# Patient Record
Sex: Female | Born: 1945 | Race: White | Hispanic: No | Marital: Married | Smoking: Current every day smoker
Health system: Southern US, Community
[De-identification: ages and names within clinical notes are randomized; demographics above are authoritative.]

## PROBLEM LIST (undated history)

## (undated) DIAGNOSIS — F410 Panic disorder [episodic paroxysmal anxiety] without agoraphobia: Secondary | ICD-10-CM

## (undated) DIAGNOSIS — F988 Other specified behavioral and emotional disorders with onset usually occurring in childhood and adolescence: Secondary | ICD-10-CM

## (undated) DIAGNOSIS — J189 Pneumonia, unspecified organism: Secondary | ICD-10-CM

## (undated) DIAGNOSIS — F419 Anxiety disorder, unspecified: Secondary | ICD-10-CM

## (undated) DIAGNOSIS — K649 Unspecified hemorrhoids: Secondary | ICD-10-CM

## (undated) DIAGNOSIS — G47419 Narcolepsy without cataplexy: Secondary | ICD-10-CM

## (undated) DIAGNOSIS — J9601 Acute respiratory failure with hypoxia: Secondary | ICD-10-CM

## (undated) DIAGNOSIS — E876 Hypokalemia: Secondary | ICD-10-CM

## (undated) HISTORY — DX: Other specified behavioral and emotional disorders with onset usually occurring in childhood and adolescence: F98.8

## (undated) HISTORY — DX: Unspecified hemorrhoids: K64.9

## (undated) HISTORY — DX: Narcolepsy without cataplexy: G47.419

## (undated) HISTORY — DX: Anxiety disorder, unspecified: F41.9

## (undated) HISTORY — DX: Pneumonia, unspecified organism: J18.9

## (undated) HISTORY — DX: Panic disorder (episodic paroxysmal anxiety): F41.0

## (undated) HISTORY — DX: Hypokalemia: E87.6

## (undated) HISTORY — PX: TUBAL LIGATION: SHX77

---
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
## 2007-12-21 ENCOUNTER — Inpatient Hospital Stay: Payer: Self-pay | Admitting: Internal Medicine

## 2010-06-14 ENCOUNTER — Inpatient Hospital Stay: Payer: Self-pay | Admitting: Psychiatry

## 2011-03-01 ENCOUNTER — Emergency Department: Payer: Self-pay | Admitting: Emergency Medicine

## 2011-10-14 DIAGNOSIS — G47419 Narcolepsy without cataplexy: Secondary | ICD-10-CM | POA: Insufficient documentation

## 2011-12-03 ENCOUNTER — Inpatient Hospital Stay: Payer: Self-pay | Admitting: Internal Medicine

## 2011-12-03 LAB — ACETAMINOPHEN LEVEL
Acetaminophen: <2 ug/mL
Acetaminophen: <2 ug/mL

## 2011-12-03 LAB — DRUG SCREEN, URINE
Amphetamines, Ur Screen: NEGATIVE (ref ?–1000)
Barbiturates, Ur Screen: NEGATIVE (ref ?–200)
Benzodiazepine, Ur Scrn: POSITIVE (ref ?–200)
Cocaine Metabolite,Ur ~~LOC~~: NEGATIVE (ref ?–300)
MDMA (Ecstasy)Ur Screen: NEGATIVE (ref ?–500)
Methadone, Ur Screen: NEGATIVE (ref ?–300)
Opiate, Ur Screen: NEGATIVE (ref ?–300)
Tricyclic, Ur Screen: NEGATIVE (ref ?–1000)

## 2011-12-03 LAB — URINALYSIS, COMPLETE
Bacteria: NONE SEEN
Blood: NEGATIVE
Hyaline Cast: 4
Leukocyte Esterase: NEGATIVE
Nitrite: NEGATIVE
Ph: 5 (ref 4.5–8.0)
Protein: 30
RBC,UR: 1 /HPF (ref 0–5)
Specific Gravity: 1.026 (ref 1.003–1.030)
Squamous Epithelial: <1
WBC UR: 1 /HPF (ref 0–5)

## 2011-12-03 LAB — COMPREHENSIVE METABOLIC PANEL
Albumin: 3 g/dL — ABNORMAL LOW (ref 3.4–5.0)
Alkaline Phosphatase: 49 U/L — ABNORMAL LOW (ref 50–136)
Alkaline Phosphatase: 59 U/L (ref 50–136)
Alkaline Phosphatase: 55 U/L (ref 50–136)
Anion Gap: 5 — ABNORMAL LOW (ref 7–16)
BUN: 18 mg/dL (ref 7–18)
Bilirubin,Total: 0.3 mg/dL (ref 0.2–1.0)
Bilirubin,Total: 0.2 mg/dL (ref 0.2–1.0)
Bilirubin,Total: 0.2 mg/dL (ref 0.2–1.0)
Calcium, Total: 8.8 mg/dL (ref 8.5–10.1)
Calcium, Total: 7.6 mg/dL — ABNORMAL LOW (ref 8.5–10.1)
Calcium, Total: 7.9 mg/dL — ABNORMAL LOW (ref 8.5–10.1)
Chloride: 116 mmol/L — ABNORMAL HIGH (ref 98–107)
Chloride: 119 mmol/L — ABNORMAL HIGH (ref 98–107)
Chloride: 116 mmol/L — ABNORMAL HIGH (ref 98–107)
Chloride: 117 mmol/L — ABNORMAL HIGH (ref 98–107)
Co2: 23 mmol/L (ref 21–32)
Co2: 22 mmol/L (ref 21–32)
Co2: 21 mmol/L (ref 21–32)
Creatinine: 0.83 mg/dL (ref 0.60–1.30)
Creatinine: 0.69 mg/dL (ref 0.60–1.30)
Creatinine: 0.62 mg/dL (ref 0.60–1.30)
EGFR (African American): >60
EGFR (African American): >60
EGFR (Non-African Amer.): >60
EGFR (Non-African Amer.): >60
Osmolality: 286 (ref 275–301)
Potassium: 3.9 mmol/L (ref 3.5–5.1)
Potassium: 4.1 mmol/L (ref 3.5–5.1)
Potassium: 3.9 mmol/L (ref 3.5–5.1)
SGOT(AST): 44 U/L — ABNORMAL HIGH (ref 15–37)
SGPT (ALT): 24 U/L (ref 12–78)
SGPT (ALT): 20 U/L (ref 12–78)
SGPT (ALT): 18 U/L (ref 12–78)
SGPT (ALT): 23 U/L (ref 12–78)
Sodium: 145 mmol/L (ref 136–145)
Sodium: 147 mmol/L — ABNORMAL HIGH (ref 136–145)
Total Protein: 7.2 g/dL (ref 6.4–8.2)
Total Protein: 5.6 g/dL — ABNORMAL LOW (ref 6.4–8.2)

## 2011-12-03 LAB — BASIC METABOLIC PANEL
Anion Gap: 8 (ref 7–16)
Anion Gap: 8 (ref 7–16)
BUN: 11 mg/dL (ref 7–18)
Calcium, Total: 7.5 mg/dL — ABNORMAL LOW (ref 8.5–10.1)
Chloride: 120 mmol/L — ABNORMAL HIGH (ref 98–107)
Chloride: 115 mmol/L — ABNORMAL HIGH (ref 98–107)
Co2: 20 mmol/L — ABNORMAL LOW (ref 21–32)
Creatinine: 0.71 mg/dL (ref 0.60–1.30)
Creatinine: 0.62 mg/dL (ref 0.60–1.30)
EGFR (African American): >60
EGFR (African American): >60
Glucose: 89 mg/dL (ref 65–99)
Osmolality: 296 (ref 275–301)
Osmolality: 286 (ref 275–301)
Sodium: 144 mmol/L (ref 136–145)

## 2011-12-03 LAB — CBC
HCT: 38.4 % (ref 35.0–47.0)
MCH: 30.6 pg (ref 26.0–34.0)
MCHC: 32.7 g/dL (ref 32.0–36.0)
MCV: 94 fL (ref 80–100)
Platelet: 277 10*3/uL (ref 150–440)
RDW: 14 % (ref 11.5–14.5)
WBC: 8 10*3/uL (ref 3.6–11.0)

## 2011-12-03 LAB — SALICYLATE LEVEL
Salicylates, Serum: 48.6 mg/dL
Salicylates, Serum: 37.4 mg/dL

## 2011-12-03 LAB — ETHANOL
Ethanol %: <0.003 % (ref 0.000–0.080)
Ethanol: <3 mg/dL

## 2011-12-03 LAB — TSH: Thyroid Stimulating Horm: 1.2 u[IU]/mL

## 2011-12-04 LAB — COMPREHENSIVE METABOLIC PANEL
Alkaline Phosphatase: 54 U/L (ref 50–136)
Bilirubin,Total: 0.2 mg/dL (ref 0.2–1.0)
Calcium, Total: 8.2 mg/dL — ABNORMAL LOW (ref 8.5–10.1)
Chloride: 112 mmol/L — ABNORMAL HIGH (ref 98–107)
Co2: 27 mmol/L (ref 21–32)
Creatinine: 0.55 mg/dL — ABNORMAL LOW (ref 0.60–1.30)
EGFR (African American): >60
EGFR (Non-African Amer.): >60
SGOT(AST): 36 U/L (ref 15–37)
SGPT (ALT): 20 U/L (ref 12–78)
Total Protein: 5.8 g/dL — ABNORMAL LOW (ref 6.4–8.2)

## 2011-12-04 LAB — SALICYLATE LEVEL: Salicylates, Serum: 16.7 mg/dL — ABNORMAL HIGH

## 2014-02-03 ENCOUNTER — Emergency Department: Payer: Self-pay | Admitting: Internal Medicine

## 2014-02-03 LAB — COMPREHENSIVE METABOLIC PANEL WITH GFR
Albumin: 3.4 g/dL (ref 3.4–5.0)
Alkaline Phosphatase: 61 U/L
Anion Gap: 7 (ref 7–16)
BUN: 13 mg/dL (ref 7–18)
Bilirubin,Total: 0.2 mg/dL (ref 0.2–1.0)
Calcium, Total: 8.5 mg/dL (ref 8.5–10.1)
Chloride: 108 mmol/L — ABNORMAL HIGH (ref 98–107)
Co2: 30 mmol/L (ref 21–32)
Creatinine: 0.8 mg/dL (ref 0.60–1.30)
EGFR (African American): >60
EGFR (Non-African Amer.): >60
Glucose: 89 mg/dL (ref 65–99)
Osmolality: 288 (ref 275–301)
Potassium: 2.6 mmol/L — ABNORMAL LOW (ref 3.5–5.1)
SGOT(AST): 33 U/L (ref 15–37)
SGPT (ALT): 21 U/L
Sodium: 145 mmol/L (ref 136–145)
Total Protein: 6.7 g/dL (ref 6.4–8.2)

## 2014-02-03 LAB — URINALYSIS, COMPLETE
BLOOD: NEGATIVE
Bacteria: NONE SEEN
Bilirubin,UR: NEGATIVE
Glucose,UR: NEGATIVE mg/dL (ref 0–75)
LEUKOCYTE ESTERASE: NEGATIVE
Nitrite: NEGATIVE
Ph: 7 (ref 4.5–8.0)
Protein: 30
Specific Gravity: 1.017 (ref 1.003–1.030)
WBC UR: 2 /HPF (ref 0–5)

## 2014-02-03 LAB — CBC
HCT: 40.4 % (ref 35.0–47.0)
HGB: 13.2 g/dL (ref 12.0–16.0)
MCH: 32 pg (ref 26.0–34.0)
MCHC: 32.8 g/dL (ref 32.0–36.0)
MCV: 98 fL (ref 80–100)
Platelet: 330 10*3/uL (ref 150–440)
RBC: 4.14 10*6/uL (ref 3.80–5.20)
RDW: 13.7 % (ref 11.5–14.5)
WBC: 5.1 10*3/uL (ref 3.6–11.0)

## 2014-02-03 LAB — CK TOTAL AND CKMB (NOT AT ARMC)
CK, TOTAL: 318 U/L — AB (ref 26–192)
CK-MB: 1.2 ng/mL (ref 0.5–3.6)

## 2014-02-03 LAB — TROPONIN I: Troponin-I: <0.02 ng/mL

## 2014-02-03 LAB — INFLUENZA A,B,H1N1 - PCR (ARMC)
H1N1FLUPCR: NOT DETECTED
INFLBPCR: NEGATIVE
Influenza A By PCR: NEGATIVE

## 2014-04-26 DIAGNOSIS — Z23 Encounter for immunization: Secondary | ICD-10-CM | POA: Diagnosis not present

## 2014-04-26 DIAGNOSIS — K649 Unspecified hemorrhoids: Secondary | ICD-10-CM | POA: Diagnosis not present

## 2014-04-26 DIAGNOSIS — E876 Hypokalemia: Secondary | ICD-10-CM | POA: Diagnosis not present

## 2014-05-17 ENCOUNTER — Ambulatory Visit: Payer: Self-pay | Admitting: Family Medicine

## 2014-05-17 DIAGNOSIS — R0602 Shortness of breath: Secondary | ICD-10-CM | POA: Diagnosis not present

## 2014-05-17 DIAGNOSIS — M545 Low back pain: Secondary | ICD-10-CM | POA: Diagnosis not present

## 2014-05-17 DIAGNOSIS — M5137 Other intervertebral disc degeneration, lumbosacral region: Secondary | ICD-10-CM | POA: Diagnosis not present

## 2014-05-17 DIAGNOSIS — F419 Anxiety disorder, unspecified: Secondary | ICD-10-CM | POA: Diagnosis not present

## 2014-05-17 DIAGNOSIS — M5136 Other intervertebral disc degeneration, lumbar region: Secondary | ICD-10-CM | POA: Diagnosis not present

## 2014-06-07 DIAGNOSIS — K219 Gastro-esophageal reflux disease without esophagitis: Secondary | ICD-10-CM | POA: Diagnosis not present

## 2014-06-07 DIAGNOSIS — F419 Anxiety disorder, unspecified: Secondary | ICD-10-CM | POA: Diagnosis not present

## 2014-06-07 DIAGNOSIS — M545 Low back pain: Secondary | ICD-10-CM | POA: Diagnosis not present

## 2014-06-07 DIAGNOSIS — F988 Other specified behavioral and emotional disorders with onset usually occurring in childhood and adolescence: Secondary | ICD-10-CM | POA: Insufficient documentation

## 2014-06-14 NOTE — H&P (Signed)
PATIENT NAME:  Sandra Stout, Sandra Stout MR#:  [id number] DATE OF BIRTH:  [birth year]  DATE OF ADMISSION:  12/03/2011  PRIMARY CARE PHYSICIAN: Unknown.  CHIEF COMPLAINT: Altered mental status.   HISTORY OF PRESENT ILLNESS: Please note history is obtained from the ED staff who was able to obtain history from EMS who spoke to the patient's family. The patient currently is unable to provide any history. Medical records were also reviewed to supplement this history. This is a 69 year old female with history of depression with prior inpatient psychiatric hospitalization, narcolepsy, asthma/chronic obstructive pulmonary disease, and question of attention deficit disorder who presents with altered mental status. Reportedly the patient ran out of Xanax four days ago and this evening the family contacted EMS because she had been increasingly agitated and confused. The family denied any trauma and prior to these events she had been alert and oriented and walked without assistance. The only history the patient was able to provide to the ED staff was that she had been taking BC Powder for diarrhea. Initial evaluation in the emergency department revealed the patient's salicylate level is significantly elevated to 48.6. Attempt to contact her daughter, Evert KohlWendy Meadows, was unsuccessful to obtain further history. While in the Emergency Department, the patient had jerking limb movements with bizarre behavior of picking at her skin us if she was having hallucinations so she received Haldol.   PAST MEDICAL HISTORY:  1. Depression status post multiple inpatient psychiatric hospitalizations for severe depression.  2. 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 Review of records notes Dr. Daneen Schickodney Radtke as her neurologist.  3. Asthma/chronic obstructive pulmonary disease.  4. Question of attention deficit disorder.   PAST SURGICAL HISTORY: Unknown.  MEDICATIONS: Currently unknown. The patient was taking Xanax in the outpatient  setting. For the medications, other medications are unknown currently.  ALLERGIES: No known drug allergies.   FAMILY HISTORY: Per records, notable for diabetes, cerebrovascular accident, and heart disease.   SOCIAL HISTORY: Unknown currently. Review of records notes that she is on disability for psychiatric issues and she uses tobacco.   REVIEW OF SYSTEMS: Unable to obtain due to the patient's mental status.   PHYSICAL EXAM:   VITAL SIGNS: Temperature 98.5, heart rate 114, blood pressure 150/66, and saturation 95% on room air.   PHYSICAL EXAM:   GENERAL: No apparent distress currently. The patient is somnolent and unarousable, is confused and mumbling.   EYES: Pupils equal, round, and reactive to light and accommodation. Anicteric sclerae. Normal eyelids.   ENT: Normal external ears and nares. Oropharynx that was visualized was without any lesions.   NECK: Supple. No JVD. No thyromegaly   CARDIOVASCULAR: S1 and S2 regular rate and rhythm. Heart rate currently 88. No pretibial edema. Pulses full bilaterally in upper extremities and distally.   PULMONARY: Clear to auscultation bilaterally, normal effort.   ABDOMEN: Soft, nontender, and nondistended without hepatosplenomegaly.  MUSCULOSKELETAL: Normal tone. No clubbing or cyanosis noted.   PSYCH: The patient is confused, mumbling, not oriented. Judgment is significantly impaired.   SKIN: Warm and dry. No rash is noted. Normal color.  NEUROLOGIC: GCS score is 9 as she opens her eyes to loud voice, makes incomprehensible sounds and withdraws from pain. She moves all extremities spontaneously. Unable to assess cranial nerves and sensation.   LABS/RADIOLOGIC STUDIES: Glucose 95, BUN 18, creatinine 0.83, sodium 145, potassium 3.9, chloride 116, bicarbonate 22, calcium 8.8, bilirubin 0.3, alkaline phosphatase 62, ALT 24, AST 41, total protein 7.2, and albumin 3.9. Osmolality 290. GFR  greater than 60. Anion gap 7. Ethanol is less than 3.  WBC count 8, hemoglobin 12.6, hematocrit 38.4, platelet count 277, and MCV 94. Salicylate level was 48.6. Troponin is less than 0.02. TSH is 1.2. Acetaminophen level is less than 2.  ABG shows pH of 7.41, pCO2 31, PO2 67, FiO2 21%, bicarbonate 19.6, and saturating 95.6 on room air.   Lactic acid 0.6.   Chest x-ray shows hyperinflated lungs consistent with chronic obstructive pulmonary disease. No acute infiltrates. This is a preliminary read.   CT of the head shows no evidence of intracranial hemorrhage or mass, no abnormal fluid collections.   ASSESSMENT AND PLAN: A 69 year old female presenting with altered mental status found to have salicylate toxicity and may also be undergoing benzodiazepine withdrawal.  1. Altered mental status. Etiology of this is multifactorial including salicylate toxicity, possible benzodiazepine withdrawal and any other ingestions. However, at this time we have not identified any other ingestions and will manage the patient symptomatically.  2. Salicylate toxicity. Poison Control has been contacted and recommendations have been provided. Alkalization of serum and urine has begun with D5 water with 40 mEq of potassium chloride and 3 amps of IV bicarbonate. We will continue this fluid. Activated charcoal was not given in the ED due to patient's mental status and concern that she would not be able to protect her airway. We will monitor her electrolytes as well as salicylate level every three hours until her salicylate levels decrease two times consecutively. If her salicylate level continues to rise with one being greater than 80, seizures, pulmonary edema, or acute renal failure we will discuss with nephrology regarding emergent hemodialysis. ABG indicates respiratory alkalosis indicating that this is most likely an acute intoxication. We will continue to monitor for anion gap metabolic acidosis, hypokalemia. The patient's status is very tenuous as she could progress to  respiratory failure. However, we will avoid oral tracheal intubation as much as possible as this can be very dangerous for this patient. The patient needs to be in the Intensive Care Unit and if the decision is made for intubation we will defer to critical care recommendations as she will need significantly high tidal volumes particularly if her respiratory alkalosis and acidoses worsen. We will repeat lactic acid level as I expect this to worsen. We will supplement her glucose with her IV fluids. We will avoid sedatives. 3. Depression. The patient is on involuntary commitment at this time given her mental status and the likely degeneration of her current status given her acute salicylate toxicity. There is no family at the bedside and attempts to contact them were unsuccessful. She needs to be treated as this intoxication could prove fatal. We will place a psych consult once the patient is medically clear from medical management.  4. Narcolepsy. Review of records notes that the patient was being treated with Xanax and Ritalin about a year ago for her narcolepsy. Unclear what she is on now and if she is still being treated for narcolepsy. Further management of this will be deferred until the patient is more stable.  5. Asthma/chronic obstructive pulmonary disease. The patient is currently stable. We will have nebulizers p.r.n. Again, decision for mechanical support will be assessed based on the patient's clinical status recognizing that intubation could be quite dangerous in the setting of salicylate toxicity.  6. Prophylaxis with Lovenox.   DISPOSITION: The patient is being admitted to the Critical Care Unit for further monitoring. She will be placed on telemetry to monitor  for any fatal arrhythmias as well.   CODE STATUS: The patient is presumed FULL CODE as there is no family available.   CRITICAL CARE TIME COORDINATING PATIENT'S CARE: 40 minutes.  ____________________________ Aurther Loft,  DO aeo:slb D: 12/03/2011 04:14:52 ET     T: 12/03/2011 08:47:21 ET        JOB#: [id number] cc: Aurther Loft, DO, <Dictator> Tamario Heal E Shaylee Stanislawski DO ELECTRONICALLY SIGNED 12/10/2011 22:31

## 2014-06-14 NOTE — Discharge Summary (Signed)
PATIENT NAME:  Sandra Stout, Garnell W MR#:  [id number] DATE OF BIRTH:  1946-02-09  DATE OF ADMISSION:  12/03/2011 DATE OF DISCHARGE:  12/04/2011  PRIMARY CARE PHYSICIAN: None.   The patient follows up with [facility].     PRESENTING COMPLAINT: Altered mental status.   DISCHARGE DIAGNOSES:  1. Altered mental status due to salicylate toxicity.  2. Acute encephalopathy secondary to salicylate toxicity.   CONDITION ON DISCHARGE: Fair.   MEDICATIONS:  1. Methylphenidate 10 mg 6 to 8 tablets once a day.  2. Xanax 0.5 mg 4 times a day as needed.   DIET: Regular.   FOLLOWUP: Follow up with your [facility] neurologist in 1 to 2 weeks.   Psychiatry consultation, Dr. Margarita RanaSurya Challa.  LABS ON DISCHARGE: Glucose is 92, BUN is 7, creatinine 0.5, sodium is 146, potassium 3.3, chloride is 112, bicarbonate is 27, calcium is 8.2, bilirubin is 0.82, alkaline phosphatase is 54. LFTs normal. Total protein is 5.8, albumin is 3.0. Serum salicylate 16.7. Lactic acid is 1.1. Salicylate on admission was 48. Urinalysis negative for urinary tract infection. Urine drug screen positive for benzodiazepines. CT of the head was acute atrophy with chronic microvascular ischemic disease. Chest x-ray: No cardiopulmonary disease event. Density in 2009 suggested infiltrate in the superior segment of the right lower lobe is not evident. Serum ethanol less than 0.003. Cardiac enzymes negative. TSH is 1.2. Acetaminophen is 2.   HISTORY OF PRESENT ILLNESS:  1. [name] is a 69 year old Caucasian female presented with altered mental status, found to salicylate toxicity, and may be undergoing benzodiazepine withdrawal. She was admitted with acute toxic metabolic encephalopathy/altered mental status in the setting of salicylate toxicity, possible benzodiazepine withdrawal, and any other ingestions.  However, at this time we have not identified any other ingestions, and patient was admitted for symptomatic management. She was placed in the  intensive care unit along with sitter. Denied any suicidal ideation. Was seen by Dr. Guss Bundehalla from Psychiatry and was okay to go home from psychiatry standpoint. The patient was given IV fluids in the form of bicarbonate drip to alkalinize her urine in the setting of salicylate toxicity. Her serum salicylate was down from 48 to 16.7 prior to discharge. The patient was eating well, ambulating well prior to discharge.  2. Salicylate toxicity. Poison Control was contacted. Recommendations were provided and were followed. Salicylate levels were nontoxic by her discharge.  3. History of narcolepsy. Patient is on Xanax and Ritalin, and she follows up with [facility] where she is recommended to go to follow up with.  4. Asthma, chronic.  5. Chronic obstructive pulmonary disease, currently stable.  6. Deep vein thrombosis prophylaxis with Lovenox was given.  7. History of major depression.  8. Hospital stay otherwise remained stable.   TIME SPENT: 40 minutes.      ____________________________ Wylie HailSona A. Allena KatzPatel, MD sap:vtd D: 12/05/2011 13:55:39 ET T: 12/06/2011 11:10:56 ET JOB#: [id number]  cc: Janani Chamber A. Allena KatzPatel, MD, <Dictator> Willow OraSONA A Harrington Jobe MD ELECTRONICALLY SIGNED 12/06/2011 21:00

## 2014-06-14 NOTE — Consult Note (Signed)
PATIENT NAME:  Sandra Stout, Sandra Stout MR#:  [id number] DATE OF BIRTH:  05-Jan-1946  DATE OF CONSULTATION:  12/04/2011  CONSULTING PHYSICIAN:  Geron Mulford K. Josclyn Rosales, MD  SUBJECTIVE: The patient was seen in room #135 along with her daughter, who was very supportive, and a Psychologist, educationalone-on-one sitter.  Her daughter reports that the patient is doing very well and she is back to herself.  The patient is well dressed and groomed.  The patient is dressed in hospital clothes, but she wore makeup, and her hair was combed, and she was seen relaxing in the bed.  She was cheerful and smiling and cooperative. She was interacting well with the staff and also with her daughter and the undersigned.  She is eager to go home and take care of herself.  Her daughter reported that the patient will be coming home to stay with her for a few days, and then she will go back to her apartment. Her daughter reports that the air conditioner was working, and she just pressed the wrong button.   OBJECTIVE: The patient is dressed in hospital clothes, alert and oriented, pleasant and cooperative, no agitation.  She is fully aware of the situation that brought her for admission to the hospital.  She absolutely denies feeling depressed.  She was cheerful and smiling.  She denies feeling hopeless or helpless.  She denies feeling worthless or useless.  No psychosis.  Coordination is intact.  Memory is intact.  She denies any ideas or plans to hurt herself or others and that was not her intention to hurt herself.  She realizes that she should not be taking any other medications other than those prescribed. Insight and judgment are fair and adequate.    IMPRESSION/PLAN: The patient is stable.  Please discontinue one-on-one sitter.  Discharge the patient to the care of her daughter with whom she will be living.  She will keep up her [facility] with her neurologist who has been taking care of her mental health, and the patient  lives very well with this.  ____________________________ Jannet MantisSurya K. Guss Bundehalla, MD skc:cbb D: 12/04/2011 13:35:49 ET T: 12/04/2011 13:37:41 ET JOB#: [id number]  cc: Monika SalkSurya K. Guss Bundehalla, MD, <Dictator> Beau FannySURYA K Saivion Goettel MD ELECTRONICALLY SIGNED 12/07/2011 19:03

## 2014-06-14 NOTE — Consult Note (Signed)
PATIENT NAME:  Sandra Stout, CURENTON MR#:  [id number] DATE OF BIRTH:  1945/07/05  DATE OF CONSULTATION:  12/03/2011  REFERRING PHYSICIAN:   CONSULTING PHYSICIAN:  Samin Milke K. Larri Brewton, MD  SUBJECTIVE: The patient was seen in consultation by Dr. Guss Bunde. The patient is a 69 year old white female, retired, and has been on disability for narcolepsy and panic attacks and manic depressive illness, being followed by neurologist at [facility]. The patient is separated for 13 years after being married for many years. The patient lives by herself in a one bedroom apartment. History was obtained from the daughter and it is very reliable.   CHIEF COMPLAINT: Chief complaint according to the daughter "I've been very tired and exhausted and when exhausted she takes aspirin powders which are over-the-counter powders and she took too many".   HISTORY OF PRESENT ILLNESS: According to information obtained from the daughter and the staff, the patient's air conditioning was not working for the past few days and temperature was high in the apartment and she thought she had fever and she was taking aspirin powders and took too much of the same. In addition, she was on Xanax 1 mg p.o. twice a day and she ran out of it for three days because she did not get it filled. Daughter reported that she's the only person who has a car in the family and she works full-time and is a single mother and so the patient has difficulties going and getting her medications filled. The apartment where she lives had to get sprayed for pest control and all the neighbors came out of their apartments and they wanted to help the patient out because they realized that she lives by herself and when the went to get her out they found that her behavior was bizarre and she was not her normal self and so they called an ambulance and she was brought to [facility] Emergency Room for help.   PAST PSYCHIATRIC HISTORY: Daughter reports that she had one inpatient  hospitalization on Psychiatry in 2012 for similar episode and was inpatient for a few days for observation and was discharged home. She gets her medications from her neurologist who takes care of her mental illness and also narcolepsy. She had been to a psychiatrist many years ago and not in the recent time.   ALCOHOL AND DRUGS: Denied.   MENTAL STATUS EXAMINATION: The patient is dressed in hospital clothes, alert and oriented to place, person, and time. She is aware of the situation that brought her for admission to [facility]. Affect is appropriate with her mood which is neutral. She denies feeling depressed, denies feeling hopeless or helpless, denies feeling worthless or useless. Does admit to feeling anxious and tired and overwhelmed about taking care of herself. No evident psychosis. Denies any auditory or visual hallucinations. Denies any paranoid or suspicious ideas. Memory and recall are good. She wants to get better and wants to be helped and absolutely denies any ideas or plans to hurt herself or others. She took too many powders because she felt tired and she does this whenever she feels tired. Insight and judgment guarded.   IMPRESSION:  AXIS I:  1. History of panic anxiety disorder disorder. 2. History of manic depressive illness and being followed by neurologist at [facility].  3. Overdose of salicylates but not with intention to hurt herself.   AXIS II: Deferred.   AXIS III: Narcolepsy, being followed by [facility].   AXIS IV:  Severe. Lives alone, gets overwhelmed taking care of self, has problems with transportation that she cannot get her medications filled on time.   AXIS V: GAF 45.   RECOMMENDATIONS: The patient needs re-evaluation on Thursday, 12/04/2011, at which time follow-up consult can be done and further recommendations can be made. Currently I recommend that the patient be observed on the medical floor and then if she's stable enough  probably can be discharged with follow-up with [facility] neurolog[facility] who has been taking care of her narcolepsy and manic depression and panic attacks. Social Services is to look into what kind of help she needs with transportation as she has problems with the same at this time and lives by herself.   ____________________________ Jannet MantisSurya K. Guss Bundehalla, MD skc:drc D: 12/03/2011 15:15:30 ET T: 12/03/2011 15:43:09 ET JOB#: [id number]  cc: Monika SalkSurya K. Guss Bundehalla, MD, <Dictator> Beau FannySURYA K Rebacca Votaw MD ELECTRONICALLY SIGNED 12/04/2011 13:34

## 2014-06-20 DIAGNOSIS — F419 Anxiety disorder, unspecified: Secondary | ICD-10-CM | POA: Diagnosis not present

## 2014-06-20 DIAGNOSIS — G47411 Narcolepsy with cataplexy: Secondary | ICD-10-CM | POA: Diagnosis not present

## 2014-08-17 ENCOUNTER — Encounter: Payer: Self-pay | Admitting: Family Medicine

## 2014-08-17 ENCOUNTER — Ambulatory Visit (INDEPENDENT_AMBULATORY_CARE_PROVIDER_SITE_OTHER): Payer: Medicare Other | Admitting: Family Medicine

## 2014-08-17 VITALS — BP 128/80 | HR 105 | Temp 97.5°F | Ht 61.0 in | Wt 116.4 lb

## 2014-08-17 DIAGNOSIS — G8929 Other chronic pain: Secondary | ICD-10-CM | POA: Diagnosis not present

## 2014-08-17 DIAGNOSIS — R002 Palpitations: Secondary | ICD-10-CM | POA: Diagnosis not present

## 2014-08-17 DIAGNOSIS — R062 Wheezing: Secondary | ICD-10-CM | POA: Diagnosis not present

## 2014-08-17 DIAGNOSIS — K219 Gastro-esophageal reflux disease without esophagitis: Secondary | ICD-10-CM | POA: Diagnosis not present

## 2014-08-17 DIAGNOSIS — M549 Dorsalgia, unspecified: Secondary | ICD-10-CM | POA: Diagnosis not present

## 2014-08-17 DIAGNOSIS — R5382 Chronic fatigue, unspecified: Secondary | ICD-10-CM | POA: Diagnosis not present

## 2014-08-17 DIAGNOSIS — F419 Anxiety disorder, unspecified: Secondary | ICD-10-CM

## 2014-08-17 MED ORDER — RANITIDINE HCL 300 MG PO TABS: 150.0000 mg | ORAL_TABLET | Freq: Every day | ORAL | Status: DC

## 2014-08-17 MED ORDER — VENLAFAXINE HCL ER 75 MG PO CP24: 225.0000 mg | ORAL_CAPSULE | Freq: Every day | ORAL | Status: DC

## 2014-08-17 NOTE — Assessment & Plan Note (Signed)
Discussed with patient that I am not a psychiatrist and if she is still not feeling better on the 225mg  of the venlafaxine to the point that she needs to take xanax 4x a day, she really needs to see psychiatry. She does not want a referral to see them at this time. Notes from neurology reviewed in care everywhere. Neurology was aware and fine with the increase in her effexor. Discussed the dangers of the medications that she is on, the increased risk of anxiety with the ritalin and the increased risk of falls and memory loss with the xanax. She is not interested in coming off of them or seeing psychiatry.

## 2014-08-17 NOTE — Patient Instructions (Addendum)
Generalized Anxiety Disorder Generalized anxiety disorder (GAD) is a mental disorder. It interferes with life functions, including relationships, work, and school. GAD is different from normal anxiety, which everyone experiences at some point in their lives in response to specific life events and activities. Normal anxiety actually helps [place] prepare for and get through these life events and activities. Normal anxiety goes away after the event or activity is over.  GAD causes anxiety that is not necessarily related to specific events or activities. It also causes excess anxiety in proportion to specific events or activities. The anxiety associated with GAD is also difficult to control. GAD can vary from mild to severe. People with severe GAD can have intense waves of anxiety with physical symptoms (panic attacks).  SYMPTOMS The anxiety and worry associated with GAD are difficult to control. This anxiety and worry are related to many life events and activities and also occur more days than not for 6 months or longer. People with GAD also have three or more of the following symptoms (one or more in children):  Restlessness.   Fatigue.  Difficulty concentrating.   Irritability.  Muscle tension.  Difficulty sleeping or unsatisfying sleep. DIAGNOSIS GAD is diagnosed through an assessment by your health care provider. Your health care provider will ask you questions aboutyour mood,physical symptoms, and events in your life. Your health care provider may ask you about your medical history and use of alcohol or drugs, including prescription medicines. Your health care provider may also do a physical exam and blood tests. Certain medical conditions and the use of certain substances can cause symptoms similar to those associated with GAD. Your health care provider may refer you to a mental health specialist for further evaluation. TREATMENT The following therapies are usually used to treat GAD:    Medication. Antidepressant medication usually is prescribed for long-term daily control. Antianxiety medicines may be added in severe cases, especially when panic attacks occur.   Talk therapy (psychotherapy). Certain types of talk therapy can be helpful in treating GAD by providing support, education, and guidance. A form of talk therapy called cognitive behavioral therapy can teach you healthy ways to think about and react to daily life events and activities.  Stress managementtechniques. These include yoga, meditation, and exercise and can be very helpful when they are practiced regularly. A mental health specialist can help determine which treatment is best for you. Some people see improvement with one therapy. However, other people require a combination of therapies. Document Released: 06/08/2012 Document Revised: 06/28/2013 Document Reviewed: 06/08/2012 [facility] Patient Information 2015 [place], [place]. This information is not intended to replace advice given to you by your health care provider. Make sure you discuss any questions you have with your health care provider. Food Choices for Gastroesophageal Reflux Disease When you have gastroesophageal reflux disease (GERD), the foods you eat and your eating habits are very important. Choosing the right foods can help ease the discomfort of GERD. WHAT GENERAL GUIDELINES DO I NEED TO FOLLOW?  Choose fruits, vegetables, whole grains, low-fat dairy products, and low-fat meat, fish, and poultry.  Limit fats such as oils, salad dressings, butter, nuts, and avocado.  Keep a food diary to identify foods that cause symptoms.  Avoid foods that cause reflux. These may be different for different people.  Eat frequent small meals instead of three large meals each day.  Eat your meals slowly, in a relaxed setting.  Limit fried foods.  Cook foods using methods other than frying.  Avoid drinking  alcohol.  Avoid drinking large amounts of  liquids with your meals.  Avoid bending over or lying down until 2-3 hours after eating. WHAT FOODS ARE NOT RECOMMENDED? The following are some foods and drinks that may worsen your symptoms: Vegetables Tomatoes. Tomato juice. Tomato and spaghetti sauce. Chili peppers. Onion and garlic. Horseradish. Fruits Oranges, grapefruit, and lemon (fruit and juice). Meats High-fat meats, fish, and poultry. This includes hot dogs, ribs, ham, sausage, salami, and bacon. Dairy Whole milk and chocolate milk. Sour cream. Cream. Butter. Ice cream. Cream cheese.  Beverages Coffee and tea, with or without caffeine. Carbonated beverages or energy drinks. Condiments Hot sauce. Barbecue sauce.  Sweets/Desserts Chocolate and cocoa. Donuts. Peppermint and spearmint. Fats and Oils High-fat foods, including [place] fries and potato chips. Other Vinegar. Strong spices, such as black pepper, white pepper, red pepper, cayenne, curry powder, cloves, ginger, and chili powder. The items listed above may not be a complete list of foods and beverages to avoid. Contact your dietitian for more information. Document Released: 02/11/2005 Document Revised: 02/16/2013 Document Reviewed: 12/16/2012 [facility] Patient Information 2015 [place]. This information is not intended to replace advice given to you by your health care provider. Make sure you discuss any questions you have with your health care provider.

## 2014-08-17 NOTE — Assessment & Plan Note (Signed)
Under fair control with the meloxicam. Not interested in seeing pain management. Continue meloxicam. Continue to monitor.

## 2014-08-17 NOTE — Progress Notes (Signed)
BP 128/80 mmHg  Pulse 105  Temp(Src) 97.5 F (36.4 C)  Ht  (1.549 m)  Wt 116 lb 6.4 oz (52.799 kg)  BMI 22.01 kg/m2  SpO2 97%  LMP  (LMP Unknown)   Subjective:    Patient ID: Sandra Stout, female    DOB: Apr 29, 1945, 69 y.o.   MRN: [id number]  HPI: Sandra Stout is a 69 y.o. female  Chief Complaint  Patient presents with  . Anxiety    Patient states that her neurologist gives her the Xanax, Ritalin and Venaflaxine.    . Dizziness    Patient states that she had a dizzy spell yesterday, she has been out of her Venaflaxine, she has been out of it for several days.   ANXIETY/Depression- Neurologist keeps her on her ritalin and her xanax. TID xanax and 3-4x a day ritalin. Has been increased  of venlafaxine daily. Here today for follow up. She is not happy because her neurologist decreased her xanax to TID rather than QID. Does not feel like the venlafaxine is helping. But does feel like it helped her pain. Does not help with her anxiety. States that she needs her xanax because it helps and she has been on it since she was in her 30s. Has seen psychiatrists in the past. States that they are all "crazy doctors" and that they didn't help her.  She currently not able to get to see a psychiatrist.  Duration:stable Anxious mood: yes  Excessive worrying: yes Irritability: yes  Sweating: no Nausea: no Palpitations:yes Hyperventilation: no Panic attacks: no Agoraphobia: no  Obscessions/compulsions: yes Depressed mood: yes Depression screen [facility] 2/9 08/17/2014  Decreased Interest 3  Down, Depressed, Hopeless 3  PHQ - 2 Score 6  Altered sleeping 0  Tired, decreased energy 3  Change in appetite 0  Feeling bad or failure about yourself  0  Trouble concentrating 2  Moving slowly or fidgety/restless 3  Suicidal thoughts 0  PHQ-9 Score 14  Difficult doing work/chores Very difficult  GAD7:  Anhedonia: no Weight changes: no Insomnia: no   Hypersomnia: yes Fatigue/loss of energy:  yes Feelings of worthlessness: no Feelings of guilt: no Impaired concentration/indecisiveness: yes Suicidal ideations: no  Crying spells: no Recent Stressors/Life Changes: yes   Relationship problems: no   Family stress: yes     Financial stress: yes    Job stress: no    Recent death/loss: no  She feels like her GERD is acting up, the omeprazole didn't help. Would like to go back on zantac, but can't afford it. Can't get to GI.  Feels like her back is doing better on the meloxicam. Does not want to see the back specialist, so cancelled that appointment.   Got dizzy last week and thinks that her potassium is off again.   Relevant past medical, surgical, family and social history reviewed and updated as indicated. Interim medical history since our last visit reviewed. Allergies and medications reviewed and updated.  Review of Systems  Constitutional: Negative.   Respiratory: Negative.   Cardiovascular: Negative.   Gastrointestinal: Negative.   Neurological: Negative.   Psychiatric/Behavioral: Negative.     Per HPI unless specifically indicated above     Objective:    BP 128/80 mmHg  Pulse 105  Temp(Src) 97.5 F (36.4 C)  Ht  (1.549 m)  Wt 116 lb 6.4 oz (52.799 kg)  BMI 22.01 kg/m2  SpO2 97%  LMP  (LMP Unknown)  Wt Readings from Last 3 Encounters:  08/17/14 116 lb 6.4 oz (52.799 kg)  05/17/14 117 lb (53.071 kg)    Physical Exam  Constitutional: She is oriented to person, place, and time. She appears well-developed and well-nourished. No distress.  HENT:  Head: Normocephalic and atraumatic.  Right Ear: Hearing normal.  Left Ear: Hearing normal.  Nose: Nose normal.  Eyes: Conjunctivae and lids are normal. Right eye exhibits no discharge. Left eye exhibits no discharge. No scleral icterus.  Dilated pupils   Cardiovascular: Normal rate, regular rhythm and normal heart sounds.  Exam reveals no gallop and no friction rub.   No murmur heard. Pulmonary/Chest:  Effort normal. No respiratory distress. She has wheezes. She has no rales. She exhibits no tenderness.  Musculoskeletal: Normal range of motion.  Neurological: She is alert and oriented to person, place, and time.  Skin: Skin is warm, dry and intact. No rash noted.  Psychiatric: She has a normal mood and affect. Her speech is normal and behavior is normal. Judgment and thought content normal. Cognition and memory are normal.        Assessment & Plan:   Problem List Items Addressed This Visit      Digestive   GERD (gastroesophageal reflux disease)   Relevant Medications   ranitidine (ZANTAC) 300 MG tablet     Other   Anxiety   Relevant Medications   ALPRAZolam (XANAX) 0.5 MG tablet   venlafaxine XR (EFFEXOR-XR) 75 MG 24 hr capsule   Chronic back pain    Other Visit Diagnoses    Chronic fatigue    -  Primary    Relevant Medications    venlafaxine XR (EFFEXOR-XR) 75 MG 24 hr capsule    ranitidine (ZANTAC) 300 MG tablet    Other Relevant Orders    Comprehensive metabolic panel    TSH    Vit D  25 hydroxy (rtn osteoporosis monitoring)    CBC With Differential/Platelet    Palpitations        Relevant Medications    venlafaxine XR (EFFEXOR-XR) 75 MG 24 hr capsule    ranitidine (ZANTAC) 300 MG tablet    Other Relevant Orders    Comprehensive metabolic panel    TSH    Vit D  25 hydroxy (rtn osteoporosis monitoring)    CBC With Differential/Platelet    Wheezing            Follow up plan: Return in about 4 weeks (around 09/14/2014) for with spiro.

## 2014-08-17 NOTE — Assessment & Plan Note (Signed)
No benefit from omeprazole. Wants to restart zantac. Rx given today. Does not want to see GI due to cost at this time.

## 2014-08-17 NOTE — Assessment & Plan Note (Signed)
Discussed with patient that she has many reasons for chronic fatigue, including polypharmacy, narcolepsy, depression, anxiety, and COPD as well as coming off her venlafaxine cold [place]. Will check blood work including potassium, which has been normal when we last checked it.

## 2014-09-20 ENCOUNTER — Ambulatory Visit: Payer: Medicare Other | Admitting: Family Medicine

## 2015-10-29 IMAGING — CR DG CHEST 1V PORT
1 series · 2 of 2 positions shown · non-contrast
Comparison: 12/03/2011

CLINICAL DATA: History of falls and dizziness for 2 months. Cough
and congestion.

EXAM:
PORTABLE CHEST - 1 VIEW

[Series 1: dxr portable chest single view · 0.14mm/px · 2 of 2 slices shown]
[im 1/2]
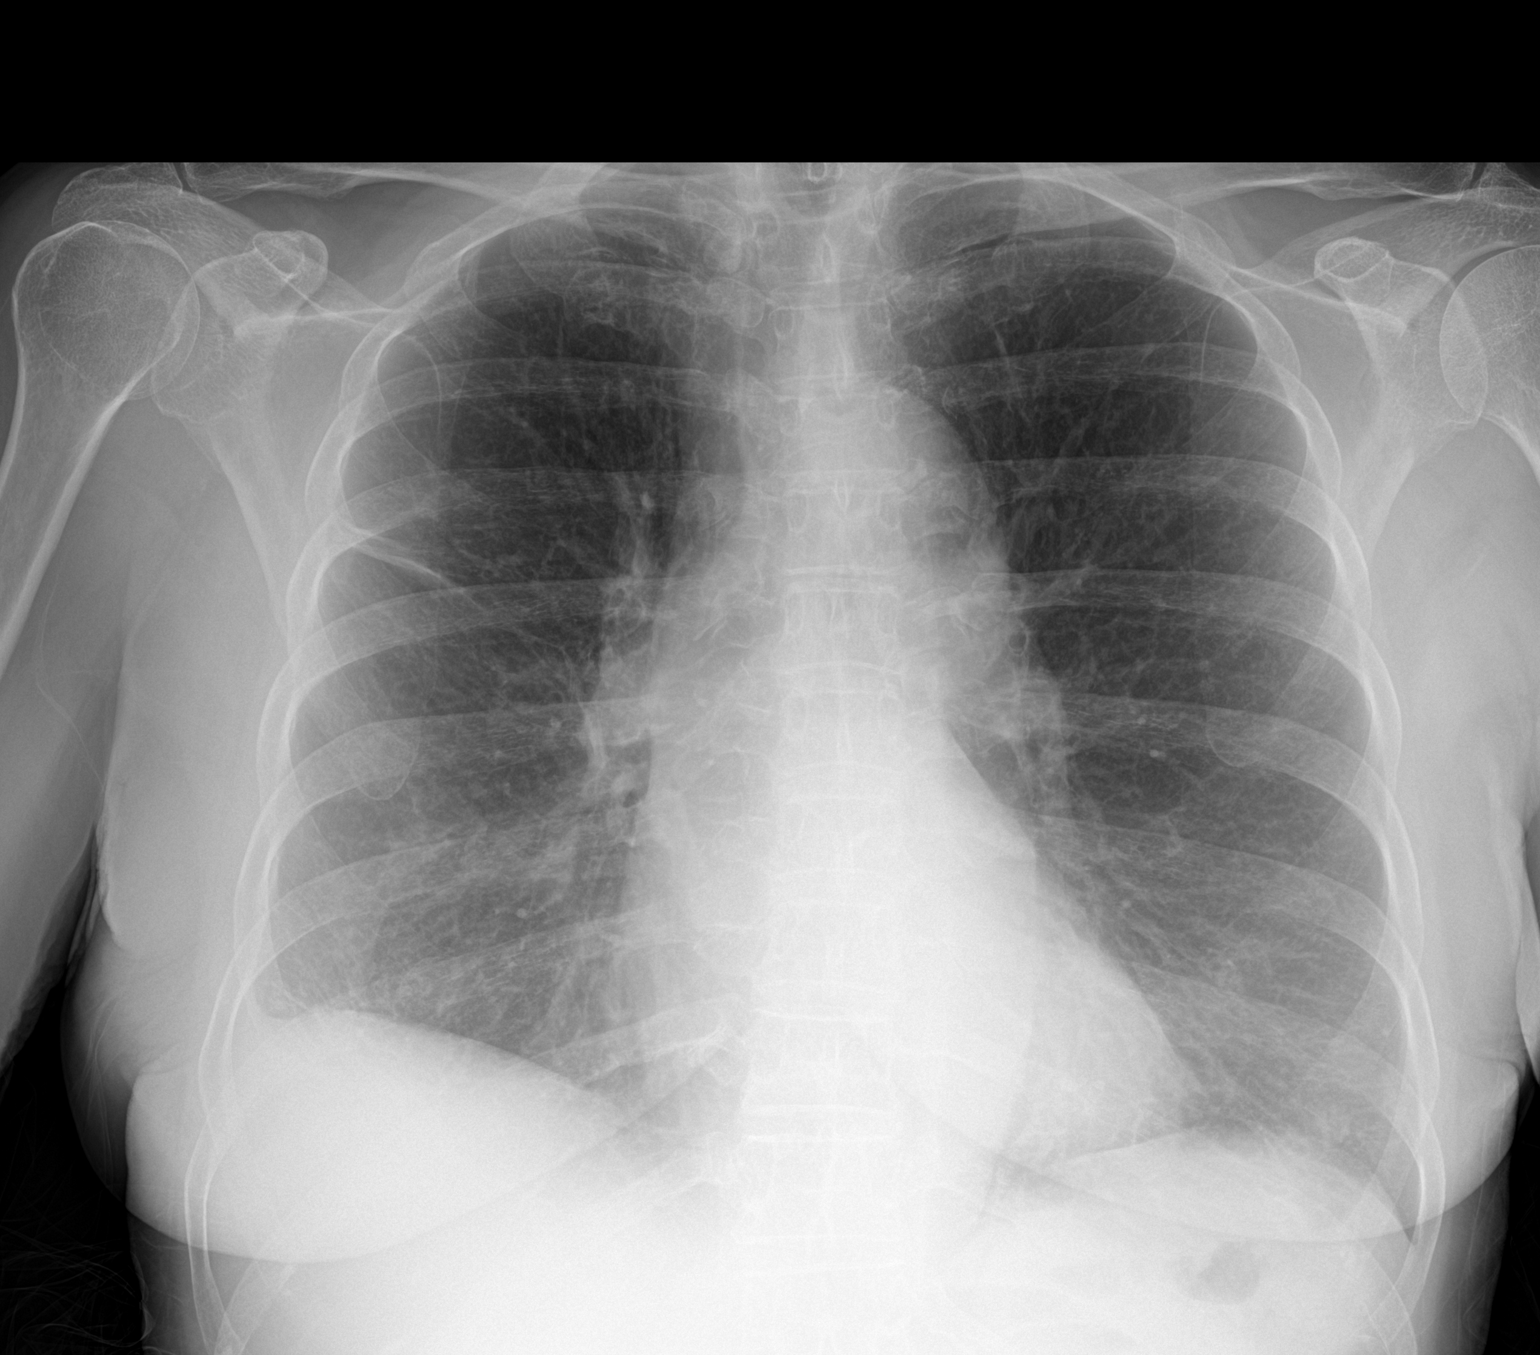
[im 2/2]
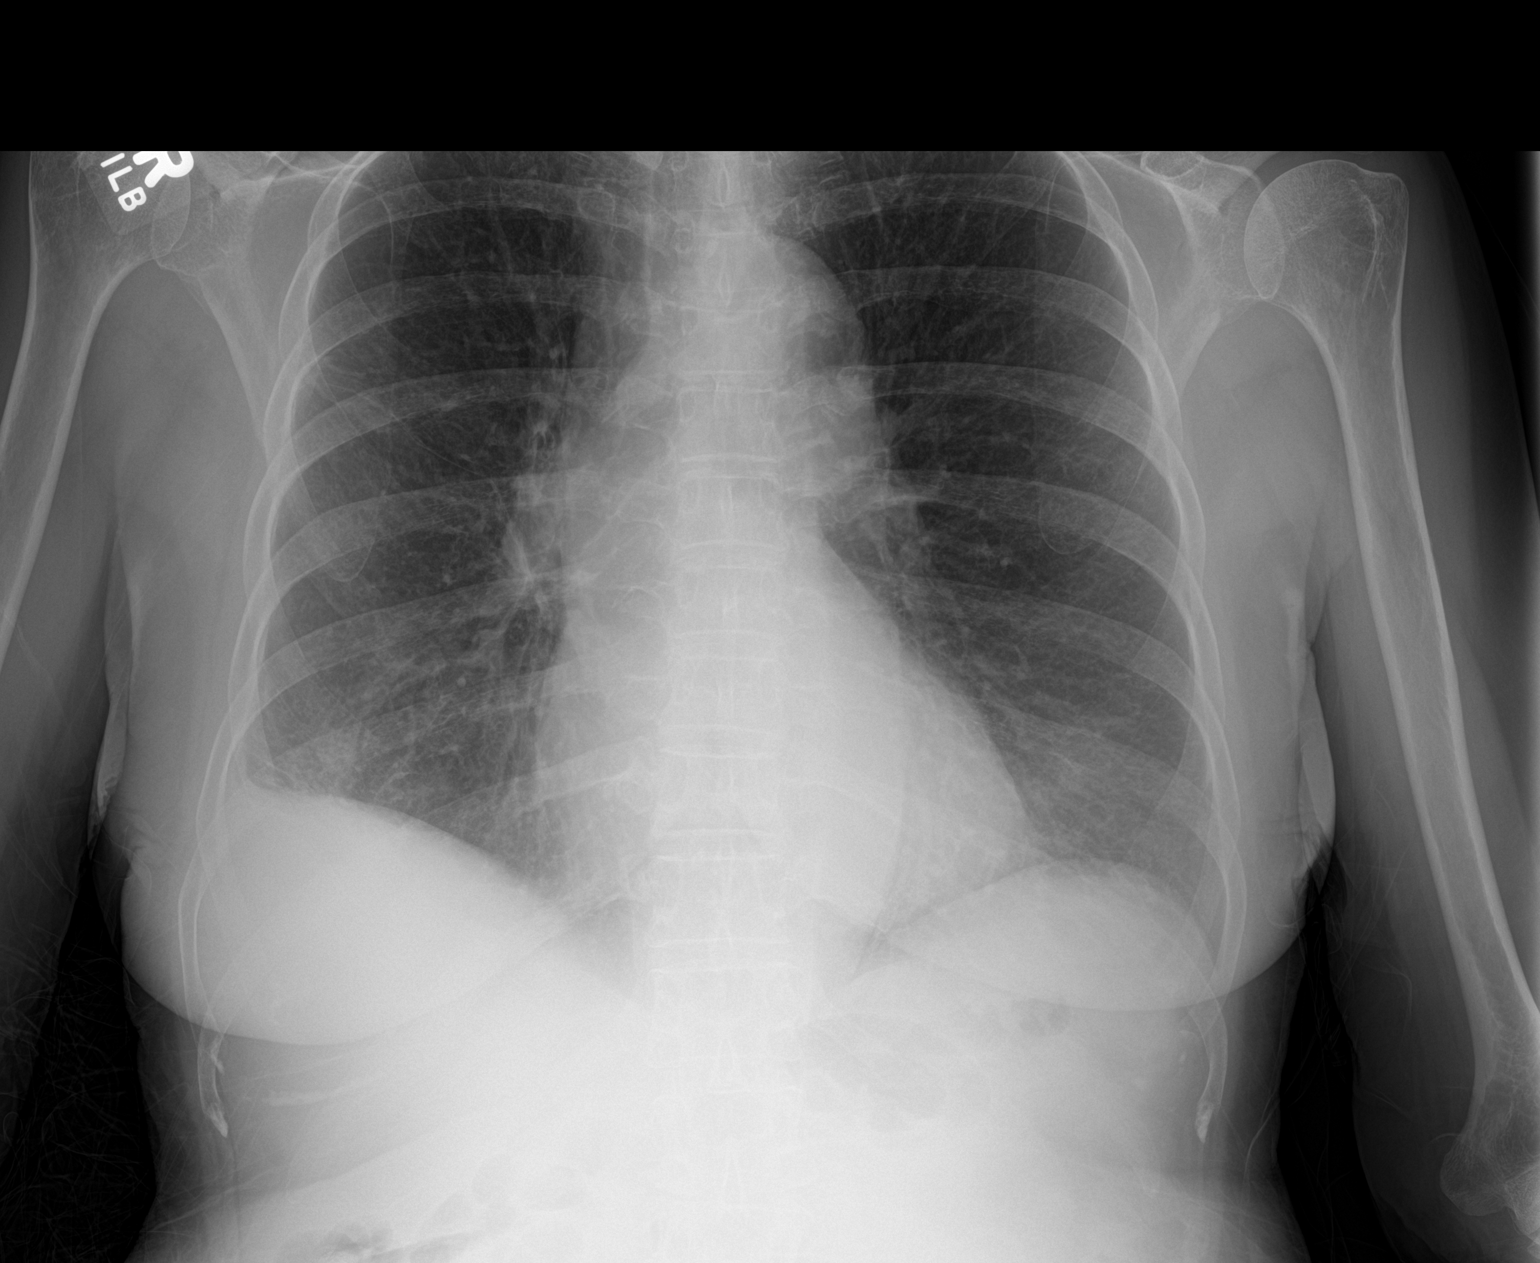

[2 of 2 positions shown; findings below may reference images not displayed]

FINDINGS: The cardiac silhouette, mediastinal and hilar contours are within
normal limits and stable. There is mild tortuosity of the thoracic
aorta. There is chronic pleural thickening and scarring changes
noted at the right lung base along with a the nodular density. This
appears to be 2 high for a nipple shadow. A may be part of the
scarring change but could not exclude a pulmonary nodule. Chest CT
is suggested for further evaluation. Linear scarring change or
subsegmental atelectasis in the right mid lung. The left lung is
clear. The bony thorax is intact.
IMPRESSION: Chronic right-sided pleural thickening and basilar scarring changes.

Vague nodular density in the right lower lobe. Could not exclude a
pulmonary nodule. Recommend chest CT for further evaluation.

## 2016-02-06 DIAGNOSIS — G47419 Narcolepsy without cataplexy: Secondary | ICD-10-CM | POA: Diagnosis not present

## 2016-02-20 ENCOUNTER — Inpatient Hospital Stay
Admission: EM | Admit: 2016-02-20 | Discharge: 2016-02-22 | DRG: 871 | Disposition: A | Discharge status: Home / Self Care | Payer: Medicare Other | Attending: Internal Medicine | Admitting: Internal Medicine

## 2016-02-20 ENCOUNTER — Encounter: Payer: Self-pay | Admitting: Emergency Medicine

## 2016-02-20 ENCOUNTER — Emergency Department: Payer: Medicare Other

## 2016-02-20 DIAGNOSIS — Z23 Encounter for immunization: Secondary | ICD-10-CM

## 2016-02-20 DIAGNOSIS — G9341 Metabolic encephalopathy: Secondary | ICD-10-CM | POA: Diagnosis present

## 2016-02-20 DIAGNOSIS — Z8249 Family history of ischemic heart disease and other diseases of the circulatory system: Secondary | ICD-10-CM | POA: Diagnosis not present

## 2016-02-20 DIAGNOSIS — Z8349 Family history of other endocrine, nutritional and metabolic diseases: Secondary | ICD-10-CM | POA: Diagnosis not present

## 2016-02-20 DIAGNOSIS — Z791 Long term (current) use of non-steroidal anti-inflammatories (NSAID): Secondary | ICD-10-CM | POA: Diagnosis not present

## 2016-02-20 DIAGNOSIS — R0602 Shortness of breath: Secondary | ICD-10-CM | POA: Diagnosis not present

## 2016-02-20 DIAGNOSIS — K219 Gastro-esophageal reflux disease without esophagitis: Secondary | ICD-10-CM | POA: Diagnosis not present

## 2016-02-20 DIAGNOSIS — Z87891 Personal history of nicotine dependence: Secondary | ICD-10-CM | POA: Diagnosis not present

## 2016-02-20 DIAGNOSIS — J189 Pneumonia, unspecified organism: Secondary | ICD-10-CM | POA: Diagnosis present

## 2016-02-20 DIAGNOSIS — R4182 Altered mental status, unspecified: Secondary | ICD-10-CM | POA: Diagnosis not present

## 2016-02-20 DIAGNOSIS — J9601 Acute respiratory failure with hypoxia: Secondary | ICD-10-CM | POA: Diagnosis present

## 2016-02-20 DIAGNOSIS — N3281 Overactive bladder: Secondary | ICD-10-CM | POA: Diagnosis not present

## 2016-02-20 DIAGNOSIS — E876 Hypokalemia: Secondary | ICD-10-CM | POA: Diagnosis not present

## 2016-02-20 DIAGNOSIS — A419 Sepsis, unspecified organism: Principal | ICD-10-CM | POA: Diagnosis present

## 2016-02-20 DIAGNOSIS — Z79899 Other long term (current) drug therapy: Secondary | ICD-10-CM | POA: Diagnosis not present

## 2016-02-20 DIAGNOSIS — Z809 Family history of malignant neoplasm, unspecified: Secondary | ICD-10-CM | POA: Diagnosis not present

## 2016-02-20 DIAGNOSIS — G47419 Narcolepsy without cataplexy: Secondary | ICD-10-CM | POA: Diagnosis present

## 2016-02-20 LAB — BRAIN NATRIURETIC PEPTIDE: B Natriuretic Peptide: 35 pg/mL (ref 0.0–100.0)

## 2016-02-20 LAB — CBC WITH DIFFERENTIAL/PLATELET
BASOS ABS: 0 10*3/uL (ref 0–0.1)
Basophils Relative: 1 %
Eosinophils Absolute: 0 10*3/uL (ref 0–0.7)
Eosinophils Relative: 0 %
HCT: 37.9 % (ref 35.0–47.0)
HEMOGLOBIN: 13.2 g/dL (ref 12.0–16.0)
LYMPHS ABS: 1.5 10*3/uL (ref 1.0–3.6)
LYMPHS PCT: 18 %
MCH: 32 pg (ref 26.0–34.0)
MCHC: 34.9 g/dL (ref 32.0–36.0)
MCV: 91.9 fL (ref 80.0–100.0)
Monocytes Absolute: 0.5 10*3/uL (ref 0.2–0.9)
Monocytes Relative: 6 %
NEUTROS PCT: 75 %
Neutro Abs: 6.6 10*3/uL — ABNORMAL HIGH (ref 1.4–6.5)
Platelets: 305 10*3/uL (ref 150–440)
RBC: 4.13 MIL/uL (ref 3.80–5.20)
RDW: 15.1 % — ABNORMAL HIGH (ref 11.5–14.5)
WBC: 8.8 10*3/uL (ref 3.6–11.0)

## 2016-02-20 LAB — URINALYSIS, ROUTINE W REFLEX MICROSCOPIC
BILIRUBIN URINE: NEGATIVE
Bacteria, UA: NONE SEEN
Glucose, UA: NEGATIVE mg/dL
KETONES UR: 5 mg/dL — AB
Nitrite: NEGATIVE
PH: 6 (ref 5.0–8.0)
PROTEIN: NEGATIVE mg/dL
Specific Gravity, Urine: 1.014 (ref 1.005–1.030)

## 2016-02-20 LAB — LACTIC ACID, PLASMA
Lactic Acid, Venous: 1.7 mmol/L (ref 0.5–1.9)
Lactic Acid, Venous: 0.9 mmol/L (ref 0.5–1.9)

## 2016-02-20 LAB — INFLUENZA PANEL BY PCR (TYPE A & B)
Influenza A By PCR: NEGATIVE
Influenza B By PCR: NEGATIVE

## 2016-02-20 LAB — TROPONIN I

## 2016-02-20 MED ORDER — CLINDAMYCIN PHOSPHATE 600 MG/50ML IV SOLN
600.0000 mg | Freq: Once | INTRAVENOUS | Status: AC
  Administered 2016-02-20: 600 mg via INTRAVENOUS
  Filled 2016-02-20: qty 50

## 2016-02-20 MED ORDER — DEXTROSE 5 % IV SOLN: 1.0000 g | Freq: Once | INTRAVENOUS | Status: DC

## 2016-02-20 MED ORDER — DEXTROSE 5 % IV SOLN
500.0000 mg | Freq: Once | INTRAVENOUS | Status: AC
  Administered 2016-02-20: 500 mg via INTRAVENOUS
  Filled 2016-02-20: qty 500

## 2016-02-20 MED ORDER — SODIUM CHLORIDE 0.9 % IV BOLUS (SEPSIS)
500.0000 mL | Freq: Once | INTRAVENOUS | Status: AC
  Administered 2016-02-20: 500 mL via INTRAVENOUS

## 2016-02-20 MED ORDER — CEFTRIAXONE SODIUM-DEXTROSE 1-3.74 GM-% IV SOLR
1.0000 g | Freq: Once | INTRAVENOUS | Status: AC
  Administered 2016-02-20: 1 g via INTRAVENOUS
  Filled 2016-02-20: qty 50

## 2016-02-20 MED ORDER — SODIUM CHLORIDE 0.9 % IV BOLUS (SEPSIS)
1000.0000 mL | Freq: Once | INTRAVENOUS | Status: AC
  Administered 2016-02-20: 1000 mL via INTRAVENOUS

## 2016-02-20 MED ORDER — IBUPROFEN 800 MG PO TABS
800.0000 mg | ORAL_TABLET | Freq: Once | ORAL | Status: AC
  Administered 2016-02-20: 800 mg via ORAL
  Filled 2016-02-20: qty 1

## 2016-02-20 NOTE — ED Provider Notes (Addendum)
[facility] Emergency Department Provider Note   ____________________________________________   First MD Initiated Contact with Patient 02/20/16 2110     (approximate)  I have reviewed the triage vital signs and the nursing notes.   HISTORY  Chief Complaint Ingestion; Altered Mental Status; and Fever   HPI Sandra Stout is a 70 y.o. female patient reports she is very anxious she thinks she might of taking too much anxiety medicine she lives by herself daughters with her says she feels she is acting out of it and patient also says she feels out of it. Apparently some of her Xanax is missing. Patient does have 103 fever. She is coughing up some green phlegm. This apparently started today. At present time patient is awake alert oriented 3.   Past Medical History:  Diagnosis Date  . ADD (attention deficit disorder)   . Anxiety   . Hemorrhoid   . Hypokalemia   . Narcolepsy   . Panic attacks   . Pneumonia     Patient Active Problem List   Diagnosis Date Noted  . Chronic back pain 08/17/2014  . GERD (gastroesophageal reflux disease) 08/17/2014  . Chronic fatigue 08/17/2014  . ADD (attention deficit disorder)   . Anxiety   . Narcolepsy     Past Surgical History:  Procedure Laterality Date  . TUBAL LIGATION      Prior to Admission medications   Medication Sig Start Date End Date Taking? Authorizing Provider  albuterol (PROVENTIL HFA;VENTOLIN HFA) 108 (90 BASE) MCG/ACT inhaler Inhale into the lungs every 6 (six) hours as needed for wheezing or shortness of breath.    Historical Provider, MD  ALPRAZolam Prudy Feeler(XANAX) 0.5 MG tablet Take by mouth. 06/20/14   Historical Provider, MD  b complex vitamins tablet Take 1 tablet by mouth daily.    Historical Provider, MD  calcium carbonate 200 MG capsule Take 250 mg by mouth 2 (two) times daily with a meal.    Historical Provider, MD  docusate sodium (COLACE) 50 MG capsule Take 50 mg by mouth 2 (two) times daily.     Historical Provider, MD  hydrocortisone (ANUSOL-HC) 2.5 % rectal cream Place 1 application rectally 2 (two) times daily.    Historical Provider, MD  meloxicam (MOBIC) 15 MG tablet Take 15 mg by mouth daily.    Megan P Johnson, DO  methylphenidate (RITALIN) 20 MG tablet Take 20 mg by mouth 4 (four) times daily.    Historical Provider, MD  Multiple Vitamins-Minerals (CENTRUM SILVER PO) Take by mouth.    Historical Provider, MD  Omega-3 Fatty Acids (FISH OIL) 1000 MG CAPS Take by mouth.    Historical Provider, MD  ranitidine (ZANTAC) 300 MG tablet Take 0.5 tablets (150 mg total) by mouth at bedtime. 08/17/14   Megan P Johnson, DO  venlafaxine XR (EFFEXOR-XR) 75 MG 24 hr capsule Take 3 capsules (225 mg total) by mouth daily with breakfast. 08/17/14   Dorcas CarrowMegan P Johnson, DO    Allergies Patient has no known allergies.  Family History  Problem Relation Age of Onset  . Lung disease Mother   . Thyroid disease Sister   . Lung disease Sister   . Cancer Sister     Bladder  . Heart disease Father     Social History Social History  Substance Use Topics  . Smoking status: Current Every Day Smoker    Packs/day: 0.50    Types: Cigarettes  . Smokeless tobacco: Never Used  . Alcohol use No  Review of Systems Constitutional:fever/chills Eyes: No visual changes. ENT: No sore throat. Cardiovascular: Denies chest pain. Respiratory:  shortness of breath. Gastrointestinal: No abdominal pain.  No nausea, no vomiting.  No diarrhea.  No constipation. Genitourinary: Negative for dysuria. Musculoskeletal: Negative for back pain. Skin: Negative for rash. Neurological: Negative for headaches, focal weakness or numbness.  10-point ROS otherwise negative.  ____________________________________________   PHYSICAL EXAM:  VITAL SIGNS: ED Triage Vitals [02/20/16 2038]  Enc Vitals Group     BP 138/62     Pulse Rate (!) 116     Resp 20     Temp (!) 103.1 F (39.5 C)     Temp Source Oral     SpO2  95 %     Weight 111 lb (50.3 kg)     Height 5\' 2"  (1.575 m)     Head Circumference      Peak Flow      Pain Score 0     Pain Loc      Pain Edu?      Excl. in GC?     Constitutional: Alert and oriented. . Eyes: Conjunctivae are normal. PERRL. EOMI. Head: Atraumatic. Nose: No congestion/rhinnorhea. Mouth/Throat: Mucous membranes are moist.  Oropharynx non-erythematous. Neck: No stridor. Cardiovascular: Normal rate, regular rhythm. Grossly normal heart sounds.  Good peripheral circulation. Respiratory: Normal respiratory effort.  No retractions. Lungs he is crackles Gastrointestinal: Soft and nontender. No distention. No abdominal bruits. No CVA tenderness. Musculoskeletal: No lower extremity tenderness nor edema.  No joint effusions. Neurologic:  Normal speech and language. No gross focal neurologic deficits are appreciated. Skin:  Skin is warm, dry and intact. No rash noted.   ____________________________________________   LABS (all labs ordered are listed, but only abnormal results are displayed)  Labs Reviewed  CULTURE, BLOOD (ROUTINE X 2)  CULTURE, BLOOD (ROUTINE X 2)  URINE CULTURE  COMPREHENSIVE METABOLIC PANEL  CBC WITH DIFFERENTIAL/PLATELET  URINALYSIS, ROUTINE W REFLEX MICROSCOPIC  LACTIC ACID, PLASMA  LACTIC ACID, PLASMA  TROPONIN I  BRAIN NATRIURETIC PEPTIDE  INFLUENZA PANEL BY PCR (TYPE A & B, H1N1)   ____________________________________________  EKG  KG read and interpreted by me shows sinus tachycardia 15 normal axis no acute ST-T wave changes____________________________________________  RADIOLOGY  Study Result   CLINICAL DATA:  Shortness of breath and weakness. Possible overdose.  EXAM: PORTABLE CHEST 1 VIEW  COMPARISON:  Chest radiograph 02/03/2014.  FINDINGS: Asymmetric opacity RIGHT mid and lower lung zone. Chronic elevation RIGHT hemidiaphragm with blunting. No similar findings on the LEFT. Early pneumonia could have this appearance.  Correlate clinically for aspiration. Normal cardiomediastinal silhouette. No osseous findings.  IMPRESSION: Asymmetric opacity in the RIGHT mid to lower lung zone. Suspected pneumonia, query aspiration.   Electronically Signed   By: Elsie StainJohn T Curnes M.D.   On: 02/20/2016 21:53     ____________________________________________   PROCEDURES  Procedure(s) performed:  Procedures  Critical Care performed:   ____________________________________________   INITIAL IMPRESSION / ASSESSMENT AND PLAN / ED COURSE  Pertinent labs & imaging results that were available during my care of the patient were reviewed by me and considered in my medical decision making (see chart for details).    Clinical Course      ____________________________________________   FINAL CLINICAL IMPRESSION(S) / ED DIAGNOSES  Final diagnoses:  Sepsis, due to unspecified organism (HCC)   Also pneumonia   NEW MEDICATIONS STARTED DURING THIS VISIT:  New Prescriptions   No medications on file     Note:  This document was prepared using Dragon voice recognition software and may include unintentional dictation errors.    Arnaldo Natal, MD 02/20/16 1610    Arnaldo Natal, MD 02/20/16 2216

## 2016-02-20 NOTE — ED Notes (Signed)
30 mL x 50.3 kg= 1509 mL.

## 2016-02-20 NOTE — ED Triage Notes (Addendum)
Pt presents to ED with concerns that she is unintentionally taking too much of her anxiety medication. Pt lives by herself and is said to be very forgetful and tonight has seemed very "out of it". Pt daughter counted medications and felt there were some missing. Denies SI.

## 2016-02-20 NOTE — ED Notes (Addendum)
Daughter reports being unsure of exact amount of medication pt may have taken over her prescribed dose.  Pt is able to answer questions appropriately (name, DOB, pain level, situation), and appears in NAD.  Family at bedside, no needs expressed by pt or famiy at this time.

## 2016-02-21 ENCOUNTER — Encounter: Payer: Self-pay | Admitting: *Deleted

## 2016-02-21 DIAGNOSIS — G47419 Narcolepsy without cataplexy: Secondary | ICD-10-CM | POA: Diagnosis present

## 2016-02-21 DIAGNOSIS — Z809 Family history of malignant neoplasm, unspecified: Secondary | ICD-10-CM | POA: Diagnosis not present

## 2016-02-21 DIAGNOSIS — R4182 Altered mental status, unspecified: Secondary | ICD-10-CM | POA: Diagnosis present

## 2016-02-21 DIAGNOSIS — Z8349 Family history of other endocrine, nutritional and metabolic diseases: Secondary | ICD-10-CM | POA: Diagnosis not present

## 2016-02-21 DIAGNOSIS — Z87891 Personal history of nicotine dependence: Secondary | ICD-10-CM | POA: Diagnosis not present

## 2016-02-21 DIAGNOSIS — J9601 Acute respiratory failure with hypoxia: Secondary | ICD-10-CM | POA: Diagnosis present

## 2016-02-21 DIAGNOSIS — Z791 Long term (current) use of non-steroidal anti-inflammatories (NSAID): Secondary | ICD-10-CM | POA: Diagnosis not present

## 2016-02-21 DIAGNOSIS — Z8249 Family history of ischemic heart disease and other diseases of the circulatory system: Secondary | ICD-10-CM | POA: Diagnosis not present

## 2016-02-21 DIAGNOSIS — E876 Hypokalemia: Secondary | ICD-10-CM | POA: Diagnosis present

## 2016-02-21 DIAGNOSIS — G9341 Metabolic encephalopathy: Secondary | ICD-10-CM | POA: Diagnosis present

## 2016-02-21 DIAGNOSIS — N3281 Overactive bladder: Secondary | ICD-10-CM | POA: Diagnosis present

## 2016-02-21 DIAGNOSIS — J189 Pneumonia, unspecified organism: Secondary | ICD-10-CM

## 2016-02-21 DIAGNOSIS — A419 Sepsis, unspecified organism: Secondary | ICD-10-CM | POA: Diagnosis present

## 2016-02-21 DIAGNOSIS — Z23 Encounter for immunization: Secondary | ICD-10-CM | POA: Diagnosis not present

## 2016-02-21 DIAGNOSIS — Z79899 Other long term (current) drug therapy: Secondary | ICD-10-CM | POA: Diagnosis not present

## 2016-02-21 DIAGNOSIS — K219 Gastro-esophageal reflux disease without esophagitis: Secondary | ICD-10-CM | POA: Diagnosis present

## 2016-02-21 LAB — CREATININE, SERUM
Creatinine, Ser: 0.75 mg/dL (ref 0.44–1.00)
GFR calc Af Amer: >60 mL/min (ref >60)
GFR calc non Af Amer: >60 mL/min (ref >60)

## 2016-02-21 LAB — URINE DRUG SCREEN, QUALITATIVE (ARMC ONLY)
AMPHETAMINES, UR SCREEN: NOT DETECTED
Barbiturates, Ur Screen: NOT DETECTED
Benzodiazepine, Ur Scrn: POSITIVE — AB
CANNABINOID 50 NG, UR ~~LOC~~: NOT DETECTED
COCAINE METABOLITE, UR ~~LOC~~: NOT DETECTED
MDMA (ECSTASY) UR SCREEN: NOT DETECTED
Methadone Scn, Ur: NOT DETECTED
OPIATE, UR SCREEN: NOT DETECTED
PHENCYCLIDINE (PCP) UR S: NOT DETECTED
Tricyclic, Ur Screen: NOT DETECTED

## 2016-02-21 LAB — COMPREHENSIVE METABOLIC PANEL
ALBUMIN: 3.1 g/dL — AB (ref 3.5–5.0)
ALT: 10 U/L — ABNORMAL LOW (ref 14–54)
ANION GAP: 6 (ref 5–15)
AST: 24 U/L (ref 15–41)
Alkaline Phosphatase: 37 U/L — ABNORMAL LOW (ref 38–126)
BILIRUBIN TOTAL: 0.7 mg/dL (ref 0.3–1.2)
BUN: 10 mg/dL (ref 6–20)
CHLORIDE: 112 mmol/L — AB (ref 101–111)
CO2: 22 mmol/L (ref 22–32)
Calcium: 7.9 mg/dL — ABNORMAL LOW (ref 8.9–10.3)
Creatinine, Ser: 0.69 mg/dL (ref 0.44–1.00)
GFR calc Af Amer: >60 mL/min (ref >60)
GFR calc non Af Amer: >60 mL/min (ref >60)
GLUCOSE: 105 mg/dL — AB (ref 65–99)
POTASSIUM: 3 mmol/L — AB (ref 3.5–5.1)
SODIUM: 140 mmol/L (ref 135–145)
Total Protein: 5.4 g/dL — ABNORMAL LOW (ref 6.5–8.1)

## 2016-02-21 LAB — CBC
HCT: 34 % — ABNORMAL LOW (ref 35.0–47.0)
Hemoglobin: 11.2 g/dL — ABNORMAL LOW (ref 12.0–16.0)
MCH: 31.6 pg (ref 26.0–34.0)
MCHC: 33 g/dL (ref 32.0–36.0)
MCV: 95.7 fL (ref 80.0–100.0)
PLATELETS: 231 10*3/uL (ref 150–440)
RBC: 3.56 MIL/uL — AB (ref 3.80–5.20)
RDW: 14.2 % (ref 11.5–14.5)
WBC: 9.5 10*3/uL (ref 3.6–11.0)

## 2016-02-21 LAB — MAGNESIUM: MAGNESIUM: 1.4 mg/dL — AB (ref 1.7–2.4)

## 2016-02-21 LAB — STREP PNEUMONIAE URINARY ANTIGEN: Strep Pneumo Urinary Antigen: NEGATIVE

## 2016-02-21 MED ORDER — PNEUMOCOCCAL VAC POLYVALENT 25 MCG/0.5ML IJ INJ
0.5000 mL | INJECTION | INTRAMUSCULAR | Status: AC
  Administered 2016-02-22: 0.5 mL via INTRAMUSCULAR
  Filled 2016-02-21: qty 0.5

## 2016-02-21 MED ORDER — DEXTROMETHORPHAN POLISTIREX ER 30 MG/5ML PO SUER
30.0000 mg | Freq: Two times a day (BID) | ORAL | Status: DC
  Administered 2016-02-21 – 2016-02-22 (×3): 30 mg via ORAL
  Filled 2016-02-21 (×4): qty 5

## 2016-02-21 MED ORDER — SODIUM CHLORIDE 0.9 % IV SOLN
INTRAVENOUS | Status: DC
  Administered 2016-02-21: 04:00:00 via INTRAVENOUS

## 2016-02-21 MED ORDER — ENOXAPARIN SODIUM 40 MG/0.4ML ~~LOC~~ SOLN
40.0000 mg | SUBCUTANEOUS | Status: DC
  Administered 2016-02-21: 40 mg via SUBCUTANEOUS
  Filled 2016-02-21: qty 0.4

## 2016-02-21 MED ORDER — MAGNESIUM SULFATE 4 GM/100ML IV SOLN
4.0000 g | Freq: Once | INTRAVENOUS | Status: AC
  Administered 2016-02-21: 15:00:00 4 g via INTRAVENOUS
  Filled 2016-02-21: qty 100

## 2016-02-21 MED ORDER — CEFTRIAXONE SODIUM-DEXTROSE 1-3.74 GM-% IV SOLR
1.0000 g | INTRAVENOUS | Status: DC
  Administered 2016-02-21: 16:00:00 1 g via INTRAVENOUS
  Filled 2016-02-21: qty 50

## 2016-02-21 MED ORDER — SODIUM CHLORIDE 0.9 % IV BOLUS (SEPSIS)
1000.0000 mL | Freq: Once | INTRAVENOUS | Status: AC
  Administered 2016-02-21: 1000 mL via INTRAVENOUS

## 2016-02-21 MED ORDER — DEXTROSE 5 % IV SOLN
500.0000 mg | INTRAVENOUS | Status: DC
  Administered 2016-02-21: 17:00:00 500 mg via INTRAVENOUS
  Filled 2016-02-21: qty 500

## 2016-02-21 MED ORDER — DEXTROSE 5 % IV SOLN: 1.0000 g | INTRAVENOUS | Status: DC

## 2016-02-21 MED ORDER — GUAIFENESIN ER 600 MG PO TB12
600.0000 mg | ORAL_TABLET | Freq: Two times a day (BID) | ORAL | Status: DC
Start: 2016-02-21 — End: 2016-02-22
  Administered 2016-02-21 – 2016-02-22 (×3): 600 mg via ORAL
  Filled 2016-02-21 (×3): qty 1

## 2016-02-21 MED ORDER — POTASSIUM CHLORIDE CRYS ER 20 MEQ PO TBCR
40.0000 meq | EXTENDED_RELEASE_TABLET | Freq: Two times a day (BID) | ORAL | Status: AC
  Administered 2016-02-21 (×2): 40 meq via ORAL
  Filled 2016-02-21 (×2): qty 2

## 2016-02-21 MED ORDER — FAMOTIDINE 20 MG PO TABS
20.0000 mg | ORAL_TABLET | Freq: Every day | ORAL | Status: DC
  Administered 2016-02-21: 20 mg via ORAL
  Filled 2016-02-21: qty 1

## 2016-02-21 MED ORDER — METHYLPHENIDATE HCL 10 MG PO TABS
20.0000 mg | ORAL_TABLET | Freq: Four times a day (QID) | ORAL | Status: DC
  Administered 2016-02-21 – 2016-02-22 (×4): 20 mg via ORAL
  Filled 2016-02-21: qty 2
  Filled 2016-02-21: qty 4
  Filled 2016-02-21 (×4): qty 2

## 2016-02-21 MED ORDER — DM-GUAIFENESIN ER 30-600 MG PO TB12: 1.0000 | ORAL_TABLET | Freq: Two times a day (BID) | ORAL | Status: DC

## 2016-02-21 MED ORDER — INFLUENZA VAC SPLIT QUAD 0.5 ML IM SUSY
0.5000 mL | PREFILLED_SYRINGE | INTRAMUSCULAR | Status: AC
  Administered 2016-02-22: 0.5 mL via INTRAMUSCULAR
  Filled 2016-02-21: qty 0.5

## 2016-02-21 MED ORDER — METHYLPREDNISOLONE SODIUM SUCC 125 MG IJ SOLR
60.0000 mg | INTRAMUSCULAR | Status: DC
  Administered 2016-02-21 – 2016-02-22 (×2): 60 mg via INTRAVENOUS
  Filled 2016-02-21 (×2): qty 2

## 2016-02-21 MED ORDER — CALCIUM CARBONATE 200 MG PO CAPS: 250.0000 mg | ORAL_CAPSULE | Freq: Two times a day (BID) | ORAL | Status: DC

## 2016-02-21 MED ORDER — CALCIUM CARBONATE ANTACID 500 MG PO CHEW
250.0000 mg | CHEWABLE_TABLET | Freq: Two times a day (BID) | ORAL | Status: DC
Start: 2016-02-21 — End: 2016-02-22
  Administered 2016-02-21 – 2016-02-22 (×3): 250 mg via ORAL
  Filled 2016-02-21 (×3): qty 1

## 2016-02-21 MED ORDER — IPRATROPIUM-ALBUTEROL 0.5-2.5 (3) MG/3ML IN SOLN: 3.0000 mL | Freq: Four times a day (QID) | RESPIRATORY_TRACT | Status: DC | PRN

## 2016-02-21 NOTE — Progress Notes (Signed)
[facility] [place] at [facility]   PATIENT NAME: Sandra Stout    MRN#:  [id number]  DATE OF BIRTH:  [birth year]  SUBJECTIVE:  Hospital Day: 0 days Sandra Stout is a 70 y.o. female presenting with Ingestion; Altered Mental Status; and Fever .   Overnight events: No acute overnight events Interval Events: Still complains of shortness of breath but somewhat improved, currently on 2 L nasal cannula  REVIEW OF SYSTEMS:  CONSTITUTIONAL: No fever, fatigue or weakness.  EYES: No blurred or double vision.  EARS, NOSE, AND THROAT: No tinnitus or ear pain.  RESPIRATORY: Positive cough, shortness of breath, denies wheezing or hemoptysis.  CARDIOVASCULAR: No chest pain, orthopnea, edema.  GASTROINTESTINAL: No nausea, vomiting, diarrhea or abdominal pain.  GENITOURINARY: No dysuria, hematuria.  ENDOCRINE: No polyuria, nocturia,  HEMATOLOGY: No anemia, easy bruising or bleeding SKIN: No rash or lesion. MUSCULOSKELETAL: No joint pain or arthritis.   NEUROLOGIC: No tingling, numbness, weakness.  PSYCHIATRY: No anxiety or depression.   DRUG ALLERGIES:  No Known Allergies  VITALS:  Blood pressure (!) 92/46, pulse 80, temperature 98.1 F (36.7 C), temperature source Oral, resp. rate 18, height 5\' 2"  (1.575 m), weight 50.3 kg (111 lb), SpO2 100 %.  PHYSICAL EXAMINATION:  VITAL SIGNS: Vitals:   02/21/16 0302 02/21/16 0329  BP: 98/63 (!) 92/46  Pulse: 80 80  Resp: 16 18  Temp:  98.1 F (36.7 C)   GENERAL:70 y.o.female currently in no acute distress. Weak appearing HEAD: Normocephalic, atraumatic.  EYES: Pupils equal, round, reactive to light. Extraocular muscles intact. No scleral icterus.  MOUTH: Moist mucosal membrane. Dentition intact. No abscess noted.  EAR, NOSE, THROAT: Clear without exudates. No external lesions.  NECK: Supple. No thyromegaly. No nodules. No JVD.  PULMONARY: Right basilar rhonchi with expiratory wheeze. No use of accessory muscles, Good  respiratory effort. good air entry bilaterally CHEST: Nontender to palpation.  CARDIOVASCULAR: S1 and S2. Regular rate and rhythm. No murmurs, rubs, or gallops. No edema. Pedal pulses 2+ bilaterally.  GASTROINTESTINAL: Soft, nontender, nondistended. No masses. Positive bowel sounds. No hepatosplenomegaly.  MUSCULOSKELETAL: No swelling, clubbing, or edema. Range of motion full in all extremities.  NEUROLOGIC: Cranial nerves II through XII are intact. No gross focal neurological deficits. Sensation intact. Reflexes intact.  SKIN: No ulceration, lesions, rashes, or cyanosis. Skin warm and dry. Turgor intact.  PSYCHIATRIC: Mood, affect within normal limits. The patient is awake, alert and oriented x 3. Insight, judgment intact.      LABORATORY PANEL:   CBC  Recent Labs Lab 02/21/16 0509  WBC 9.5  HGB 11.2*  HCT 34.0*  PLT 231   ------------------------------------------------------------------------------------------------------------------  Chemistries   Recent Labs Lab 02/21/16 0015 02/21/16 0509  NA 140  --   K 3.0*  --   CL 112*  --   CO2 22  --   GLUCOSE 105*  --   BUN 10  --   CREATININE 0.69 0.75  CALCIUM 7.9*  --   MG  --  1.4*  AST 24  --   ALT 10*  --   ALKPHOS 37*  --   BILITOT 0.7  --    ------------------------------------------------------------------------------------------------------------------  Cardiac Enzymes  Recent Labs Lab 02/20/16 2050  TROPONINI <0.03   ------------------------------------------------------------------------------------------------------------------  RADIOLOGY:  Dg Chest Portable 1 View  Result Date: 02/20/2016 CLINICAL DATA:  Shortness of breath and weakness. Possible overdose. EXAM: PORTABLE CHEST 1 VIEW COMPARISON:  Chest radiograph 02/03/2014. FINDINGS: Asymmetric opacity RIGHT mid and lower  lung zone. Chronic elevation RIGHT hemidiaphragm with blunting. No similar findings on the LEFT. Early pneumonia could have  this appearance. Correlate clinically for aspiration. Normal cardiomediastinal silhouette. No osseous findings. IMPRESSION: Asymmetric opacity in the RIGHT mid to lower lung zone. Suspected pneumonia, query aspiration. Electronically Signed   By: Elsie StainJohn T Curnes M.D.   On: 02/20/2016 21:53    EKG:   Orders placed or performed during the hospital encounter of 02/20/16  . ED EKG 12-Lead  . ED EKG 12-Lead  . ED EKG  . ED EKG  . EKG 12-Lead  . EKG 12-Lead    ASSESSMENT AND PLAN:   Sandra Stout is a 70 y.o. female presenting with Ingestion; Altered Mental Status; and Fever . Admitted 02/20/2016 : Day #: 0 days 1. Sepsis, present on admission: Community acquired pneumonia continue current antibiotic regimen, steroids given wheezing, breathing treatments 2. Metabolic encephalopathy, acute: Resolved likely secondary to #1+ Xanax 3. Hypokalemia: Replace goal 4-5 4. Hypomagnesemia replace goal greater than 2   All the records are reviewed and case discussed with Care Management/Social Workerr. Management plans discussed with the patient, family and they are in agreement.  CODE STATUS: full TOTAL TIME TAKING CARE OF THIS PATIENT: 33 minutes.   POSSIBLE D/C IN 2-3DAYS, DEPENDING ON CLINICAL CONDITION.   Turon Kilmer,  Mardi MainlandDavid K M.D on 02/21/2016 at 1:48 PM  Between 7am to 6pm - Pager - [phone]  After 6pm: House Pager: - [phone]  [facility]  Office  [phone]  CC: Primary care physician; Lyndon CodeKHAN, FOZIA M, MD

## 2016-02-21 NOTE — H&P (Addendum)
[facility] - [place] @ [facility] Admission History and Physical [employer] Tyjon Bowen, D.O.    Patient Name: Sandra Stout MR#: [id number] Date of Birth: 04-05-1945 Date of Admission: 02/20/2016  Referring MD/NP/PA: Dr. Darnelle CatalanMalinda Primary Care Physician: Lyndon CodeKHAN, FOZIA M, MD  Patient coming from: Home  Chief Complaint: AMS  HPI: Sandra Sealsancy Seiber is a 70 y.o. female with a known history of ADD, anxiety, hypokalemia presents to the emergency department for evaluation of AMS.  Patient was in a usual state of health until today when she reportedly has not been acting like herself.  She admits that she may have taken too much Xanax due to increased anxiety recently.   Patient herself is somewhat lethargic but conversive.    She does not recall whether or not she took any extra XCanaxc but complains of several day history of fevers/chills, cough productive of green mucus, weakness and fatigue.    Otherwise there has been no change in status. Patient has been taking medication as prescribed and there has been no recent change in medication or diet.  There has been no recent illness, travel or sick contacts.    Patient denies dizziness, chest pain, shortness of breath, N/V/C/D, abdominal pain, dysuria/frequency, changes in mental status.   ED Course: Patient rec'd Rocephin, Azithromycin, Clinda, ibuprofen, NS.   Review of Systems:  CONSTITUTIONAL: No fever/chills, fatigue, weakness, weight gain/loss, headache. EYES: No blurry or double vision. ENT: No tinnitus, postnasal drip, redness or soreness of the oropharynx. RESPIRATORY: No cough, dyspnea, wheeze, hemoptysis.  CARDIOVASCULAR: No chest pain, palpitations, syncope, orthopnea,  GASTROINTESTINAL: No nausea, vomiting, abdominal pain, constipation, diarrhea.  No hematemesis, melena or hematochezia. GENITOURINARY: No dysuria, frequency, hematuria. ENDOCRINE: No polyuria or nocturia. No heat or cold intolerance. HEMATOLOGY: No anemia, bruising,  bleeding. INTEGUMENTARY: No rashes, ulcers, lesions. MUSCULOSKELETAL: No arthritis, gout, dyspnea.  NEUROLOGIC: No numbness, tingling, ataxia, seizure-type activity, weakness. PSYCHIATRIC: No anxiety, depression, insomnia.   Past Medical History:  Diagnosis Date  . ADD (attention deficit disorder)   . Anxiety   . Hemorrhoid   . Hypokalemia   . Narcolepsy   . Panic attacks   . Pneumonia     Past Surgical History:  Procedure Laterality Date  . TUBAL LIGATION       reports that she has been smoking Cigarettes.  She has been smoking about 0.50 packs per day. She has never used smokeless tobacco. She reports that she does not drink alcohol or use drugs.  No Known Allergies  Family History  Problem Relation Age of Onset  . Lung disease Mother   . Thyroid disease Sister   . Lung disease Sister   . Cancer Sister     Bladder  . Heart disease Father    Family history has been reviewed and confirmed with patient.   Prior to Admission medications   Medication Sig Start Date End Date Taking? Authorizing Provider  albuterol (PROVENTIL HFA;VENTOLIN HFA) 108 (90 BASE) MCG/ACT inhaler Inhale into the lungs every 6 (six) hours as needed for wheezing or shortness of breath.   Yes Historical Provider, MD  ALPRAZolam Prudy Feeler(XANAX) 0.5 MG tablet Take by mouth. 06/20/14  Yes Historical Provider, MD  b complex vitamins tablet Take 1 tablet by mouth daily.   Yes Historical Provider, MD  calcium carbonate 200 MG capsule Take 250 mg by mouth 2 (two) times daily with a meal.   Yes Historical Provider, MD  methylphenidate (RITALIN) 20 MG tablet Take 20 mg by mouth 4 (four) times daily.  Yes Historical Provider, MD  Multiple Vitamins-Minerals (CENTRUM SILVER PO) Take by mouth.   Yes Historical Provider, MD  Omega-3 Fatty Acids (FISH OIL) 1000 MG CAPS Take by mouth.   Yes Historical Provider, MD  ranitidine (ZANTAC) 300 MG tablet Take 0.5 tablets (150 mg total) by mouth at bedtime. 08/17/14  Yes Megan P  Johnson, DO  venlafaxine XR (EFFEXOR-XR) 75 MG 24 hr capsule Take 3 capsules (225 mg total) by mouth daily with breakfast. Patient not taking: Reported on 02/20/2016 08/17/14   Dorcas CarrowMegan P Johnson, DO    Physical Exam: Vitals:   02/20/16 2205 02/20/16 2319 02/21/16 0051 02/21/16 0206  BP: 106/64 103/61 99/60 (!) 86/52  Pulse: (!) 109 95 89 82  Resp: 18 20 20 16   Temp:    98.2 F (36.8 C)  TempSrc:    Oral  SpO2: 94% 95% 94% 96%  Weight:      Height:        GENERAL: 70 y.o.-year-old female patient, well-developed, well-nourished lying in the bed in no acute distress.  Pleasant and cooperative.   HEENT: Head atraumatic, normocephalic. Pupils equal, round, reactive to light and accommodation. No scleral icterus. Extraocular muscles intact. Nares are patent. Oropharynx is clear. Mucus membranes moist. NECK: Supple, full range of motion. No JVD, no bruit heard. No thyroid enlargement, no tenderness, no cervical lymphadenopathy. CHEST: Normal breath sounds bilaterally. No wheezing, rales, rhonchi or crackles. No use of accessory muscles of respiration.  No reproducible chest wall tenderness.  CARDIOVASCULAR: S1, S2 normal. No murmurs, rubs, or gallops. Cap refill <2 seconds. Pulses intact distally.  ABDOMEN: Soft, nondistended, nontender, . No rebound, guarding, rigidity. Normoactive bowel sounds present in all four quadrants. No organomegaly or mass. EXTREMITIES: No pedal edema, cyanosis, or clubbing. NEUROLOGIC: Cranial nerves II through XII are grossly intact with no focal sensorimotor deficit. Muscle strength 5/5 in all extremities. Sensation intact. Gait not checked. PSYCHIATRIC: The patient is alert and oriented x 3. Normal affect, mood, thought content. SKIN: Warm, dry, and intact without obvious rash, lesion, or ulcer.   Labs on Admission: I have personally reviewed following labs and imaging studies  CBC:  Recent Labs Lab 02/20/16 2049  WBC 8.8  NEUTROABS 6.6*  HGB 13.2  HCT  37.9  MCV 91.9  PLT 305   Basic Metabolic Panel:  Recent Labs Lab 02/21/16 0015  NA 140  K 3.0*  CL 112*  CO2 22  GLUCOSE 105*  BUN 10  CREATININE 0.69  CALCIUM 7.9*   GFR: Estimated Creatinine Clearance: 51 mL/min (by C-G formula based on SCr of 0.69 mg/dL). Liver Function Tests:  Recent Labs Lab 02/21/16 0015  AST 24  ALT 10*  ALKPHOS 37*  BILITOT 0.7  PROT 5.4*  ALBUMIN 3.1*   No results for input(s): LIPASE, AMYLASE in the last 168 hours. No results for input(s): AMMONIA in the last 168 hours. Coagulation Profile: No results for input(s): INR, PROTIME in the last 168 hours. Cardiac Enzymes:  Recent Labs Lab 02/20/16 2050  TROPONINI <0.03   Urine analysis:    Component Value Date/Time   COLORURINE STRAW (A) 02/20/2016 2127   APPEARANCEUR CLEAR (A) 02/20/2016 2127   APPEARANCEUR Clear 02/03/2014 1130   LABSPEC 1.014 02/20/2016 2127   LABSPEC 1.017 02/03/2014 1130   PHURINE 6.0 02/20/2016 2127   GLUCOSEU NEGATIVE 02/20/2016 2127   GLUCOSEU Negative 02/03/2014 1130   HGBUR MODERATE (A) 02/20/2016 2127   BILIRUBINUR NEGATIVE 02/20/2016 2127   BILIRUBINUR Negative 02/03/2014 1130  KETONESUR 5 (A) 02/20/2016 2127   PROTEINUR NEGATIVE 02/20/2016 2127   NITRITE NEGATIVE 02/20/2016 2127   LEUKOCYTESUR TRACE (A) 02/20/2016 2127   LEUKOCYTESUR Negative 02/03/2014 1130   Radiological Exams on Admission: Dg Chest Portable 1 View  Result Date: 02/20/2016 CLINICAL DATA:  Shortness of breath and weakness. Possible overdose. EXAM: PORTABLE CHEST 1 VIEW COMPARISON:  Chest radiograph 02/03/2014. FINDINGS: Asymmetric opacity RIGHT mid and lower lung zone. Chronic elevation RIGHT hemidiaphragm with blunting. No similar findings on the LEFT. Early pneumonia could have this appearance. Correlate clinically for aspiration. Normal cardiomediastinal silhouette. No osseous findings. IMPRESSION: Asymmetric opacity in the RIGHT mid to lower lung zone. Suspected pneumonia,  query aspiration. Electronically Signed   By: Elsie Stain M.D.   On: 02/20/2016 21:53    EKG: Sinus tachy at 115 with normal axis and nonspecific ST-T wave changes.   Assessment/Plan Active Problems:   Sepsis due to pneumonia [facility])    This is a 70 y.o. female with a history of ADD, anxiety, hypokalemia, now being admitted with: 1. Sepsis 2/2 CAP, - Admit to inpatient - IV Rocephin & Azithromycin per pharmacy - Clinda given for concern of aspiration due to RML infiltrate however no recent history of vomiting.   - IV fluid hydration - Duonebs, expectorants & O2 therapy as needed - Follow up blood & sputum cultures  2. Hypokalemia, mild, chronic - Replace PO, recheck BMP in AM.  3. History of anxiety/ADD - Hold Xanax 2/2 AMS - Continue Ritalin, Effexor  4. History of GERD  - Continue Zantac  Admission status: Inpatient  IV Fluids: IVNS Diet/Nutrition: Regular Consults called: None  DVT Px: Lovenox, SCDs and early ambulation Code Status: Full Code  Disposition Plan: To home in 1-2 days   All the records are reviewed and case discussed with ED provider. Management plans discussed with the patient and/or family who express understanding and agree with plan of care.  Tallie Hevia D.O. on 02/21/2016 at 2:23 AM Between 7am to 6pm - Pager - [phone] After 6pm go to [email or web address] - Social research officer, government [facility] [place] [facility] Office [phone] CC: Primary care physician; Lyndon Code, MD   02/21/2016, 2:23 AM

## 2016-02-21 NOTE — ED Notes (Signed)
Transporting to 1C-102

## 2016-02-21 NOTE — Progress Notes (Signed)
While rounding, CH made initial visit to room 102. Pt presented restless but very conversational. Pt needed someone to hear her story which was provided. Pt said she just wanted to "feel better" and asked for prayer on the same. CH provided the ministry of empathetic listening and prayer. CH is available for follow up as needed.    02/21/16 1300  Clinical Encounter Type  Visited With Patient  Visit Type Initial;Spiritual support  Referral From Nurse  Spiritual Encounters  Spiritual Needs Prayer  Stress Factors  Patient Stress Factors Health changes

## 2016-02-21 NOTE — ED Notes (Signed)
Pt's daughter to be notified when pt admitted to inpatient unit: Evert KohlWendy Meadows [phone]

## 2016-02-22 DIAGNOSIS — Z23 Encounter for immunization: Secondary | ICD-10-CM | POA: Diagnosis not present

## 2016-02-22 LAB — BASIC METABOLIC PANEL
ANION GAP: 4 — AB (ref 5–15)
BUN: 8 mg/dL (ref 6–20)
CHLORIDE: 115 mmol/L — AB (ref 101–111)
CO2: 20 mmol/L — AB (ref 22–32)
Calcium: 8.3 mg/dL — ABNORMAL LOW (ref 8.9–10.3)
Creatinine, Ser: 0.61 mg/dL (ref 0.44–1.00)
GFR calc Af Amer: >60 mL/min (ref >60)
GLUCOSE: 99 mg/dL (ref 65–99)
POTASSIUM: 3.7 mmol/L (ref 3.5–5.1)
Sodium: 139 mmol/L (ref 135–145)

## 2016-02-22 LAB — URINE CULTURE

## 2016-02-22 LAB — LEGIONELLA PNEUMOPHILA SEROGP 1 UR AG: L. pneumophila Serogp 1 Ur Ag: NEGATIVE

## 2016-02-22 LAB — MAGNESIUM: Magnesium: 2 mg/dL (ref 1.7–2.4)

## 2016-02-22 MED ORDER — LEVOFLOXACIN 500 MG PO TABS: 500.0000 mg | ORAL_TABLET | Freq: Every day | ORAL | 0 refills | Status: DC

## 2016-02-22 MED ORDER — OXYBUTYNIN CHLORIDE ER 10 MG PO TB24: 10.0000 mg | ORAL_TABLET | Freq: Every day | ORAL | 0 refills | Status: DC

## 2016-02-22 MED ORDER — PREDNISONE 10 MG PO TABS: ORAL_TABLET | ORAL | 0 refills | Status: DC

## 2016-02-22 MED ORDER — AZITHROMYCIN 500 MG PO TABS: 500.0000 mg | ORAL_TABLET | Freq: Every day | ORAL | Status: DC

## 2016-02-22 MED ORDER — GUAIFENESIN ER 600 MG PO TB12: 600.0000 mg | ORAL_TABLET | Freq: Two times a day (BID) | ORAL | 0 refills | Status: DC

## 2016-02-22 MED ORDER — POTASSIUM CHLORIDE ER 10 MEQ PO TBCR: 10.0000 meq | EXTENDED_RELEASE_TABLET | Freq: Every day | ORAL | 0 refills | Status: DC

## 2016-02-22 NOTE — Progress Notes (Signed)
Patient discharged home per MD order. All discharge instructions given and all questions answered. 

## 2016-02-22 NOTE — Progress Notes (Signed)
Patient refuses bed alarm. Patient re-educated on purpose of bed alarm.

## 2016-02-22 NOTE — Discharge Summary (Signed)
[employer] [facility] - [place] at [facility]   PATIENT NAME: Sandra Sealsancy Stout    MR#:  [id number]  DATE OF BIRTH:  1945/11/27  DATE OF ADMISSION:  02/20/2016 ADMITTING PHYSICIAN: Tonye RoyaltyAlexis Hugelmeyer, DO  DATE OF DISCHARGE: 02/22/16  PRIMARY CARE PHYSICIAN: Lyndon CodeKHAN, FOZIA M, MD    ADMISSION DIAGNOSIS:  Sepsis, due to unspecified organism (HCC) [A41.9]  DISCHARGE DIAGNOSIS:  Active Problems:   Sepsis due to pneumonia (HCC) hypokalemia Overactive bladder Acute hypoxic respiratory failure - resolved SECONDARY DIAGNOSIS:   Past Medical History:  Diagnosis Date  . ADD (attention deficit disorder)   . Anxiety   . Hemorrhoid   . Hypokalemia   . Narcolepsy   . Panic attacks   . Pneumonia     HOSPITAL COURSE:  Sandra Stout  is a 70 y.o. female admitted 02/20/2016 with chief complaint Ingestion; Altered Mental Status; and Fever . Please see H&P performed by Tonye RoyaltyAlexis Hugelmeyer, DO for further information. Patient presented with the above symptoms, meeting septic criteria on admission.  She was found to have pneumonia on xray - improved with antibiotics, breathing treatments, steroids weaned off oxygen She also had complaints of overactive bladder - will start trial of oxybutynin   DISCHARGE CONDITIONS:   stable  CONSULTS OBTAINED:    DRUG ALLERGIES:  No Known Allergies  DISCHARGE MEDICATIONS:   Current Discharge Medication List    START taking these medications   Details  guaiFENesin (MUCINEX) 600 MG 12 hr tablet Take 1 tablet (600 mg total) by mouth 2 (two) times daily. Qty: 15 tablet, Refills: 0    levofloxacin (LEVAQUIN) 500 MG tablet Take 1 tablet (500 mg total) by mouth daily. Qty: 4 tablet, Refills: 0    oxybutynin (DITROPAN-XL) 10 MG 24 hr tablet Take 1 tablet (10 mg total) by mouth at bedtime. Qty: 30 tablet, Refills: 0    potassium chloride (K-DUR) 10 MEQ tablet Take 1 tablet (10 mEq total) by mouth daily. Qty: 30 tablet, Refills: 0    predniSONE  (DELTASONE) 10 MG tablet 40mg  x1 day, 20mg  x2day, 10mg  x2 day then stop Qty: 10 tablet, Refills: 0      CONTINUE these medications which have NOT CHANGED   Details  albuterol (PROVENTIL HFA;VENTOLIN HFA) 108 (90 BASE) MCG/ACT inhaler Inhale into the lungs every 6 (six) hours as needed for wheezing or shortness of breath.    ALPRAZolam (XANAX) 0.5 MG tablet Take by mouth.    b complex vitamins tablet Take 1 tablet by mouth daily.    calcium carbonate 200 MG capsule Take 250 mg by mouth 2 (two) times daily with a meal.    methylphenidate (RITALIN) 20 MG tablet Take 20 mg by mouth 4 (four) times daily.    Multiple Vitamins-Minerals (CENTRUM SILVER PO) Take by mouth.    Omega-3 Fatty Acids (FISH OIL) 1000 MG CAPS Take by mouth.    ranitidine (ZANTAC) 300 MG tablet Take 0.5 tablets (150 mg total) by mouth at bedtime. Qty: 60 tablet, Refills: 6   Associated Diagnoses: Chronic fatigue; Palpitations    venlafaxine XR (EFFEXOR-XR) 75 MG 24 hr capsule Take 3 capsules (225 mg total) by mouth daily with breakfast. Qty: 90 capsule, Refills: 6   Associated Diagnoses: Chronic fatigue; Palpitations         DISCHARGE INSTRUCTIONS:    DIET:  Regular diet  DISCHARGE CONDITION:  Stable  ACTIVITY:  Activity as tolerated  OXYGEN:  Home Oxygen: No.   Oxygen Delivery: room air  DISCHARGE LOCATION:  home   If you experience worsening of your admission symptoms, develop shortness of breath, life threatening emergency, suicidal or homicidal thoughts you must seek medical attention immediately by calling 911 or calling your MD immediately  if symptoms less severe.  You Must read complete instructions/literature along with all the possible adverse reactions/side effects for all the Medicines you take and that have been prescribed to you. Take any new Medicines after you have completely understood and accpet all the possible adverse reactions/side effects.   Please note  You were cared  for by a hospitalist during your hospital stay. If you have any questions about your discharge medications or the care you received while you were in the hospital after you are discharged, you can call the unit and asked to speak with the hospitalist on call if the hospitalist that took care of you is not available. Once you are discharged, your primary care physician will handle any further medical issues. Please note that NO REFILLS for any discharge medications will be authorized once you are discharged, as it is imperative that you return to your primary care physician (or establish a relationship with a primary care physician if you do not have one) for your aftercare needs so that they can reassess your need for medications and monitor your lab values.    On the day of Discharge:   VITAL SIGNS:  Blood pressure 128/68, pulse 88, temperature 98.3 F (36.8 C), temperature source Oral, resp. rate (!) 23, height 5\' 2"  (1.575 m), weight 50.3 kg (111 lb), SpO2 93 %.  I/O:   Intake/Output Summary (Last 24 hours) at 02/22/16 0904 Last data filed at 02/22/16 0700  Gross per 24 hour  Intake             2708 ml  Output             1550 ml  Net             1158 ml    PHYSICAL EXAMINATION:  GENERAL:  70 y.o.-year-old patient lying in the bed with no acute distress.  EYES: Pupils equal, round, reactive to light and accommodation. No scleral icterus. Extraocular muscles intact.  HEENT: Head atraumatic, normocephalic. Oropharynx and nasopharynx clear.  NECK:  Supple, no jugular venous distention. No thyroid enlargement, no tenderness.  LUNGS: Normal breath sounds bilaterally, no wheezing, rales,rhonchi or crepitation. No use of accessory muscles of respiration.  CARDIOVASCULAR: S1, S2 normal. No murmurs, rubs, or gallops.  ABDOMEN: Soft, non-tender, non-distended. Bowel sounds present. No organomegaly or mass.  EXTREMITIES: No pedal edema, cyanosis, or clubbing.  NEUROLOGIC: Cranial nerves II  through XII are intact. Muscle strength 5/5 in all extremities. Sensation intact. Gait not checked.  PSYCHIATRIC: The patient is alert and oriented x 3.  SKIN: No obvious rash, lesion, or ulcer.   DATA REVIEW:   CBC  Recent Labs Lab 02/21/16 0509  WBC 9.5  HGB 11.2*  HCT 34.0*  PLT 231    Chemistries   Recent Labs Lab 02/21/16 0015  02/22/16 0546  NA 140  --  139  K 3.0*  --  3.7  CL 112*  --  115*  CO2 22  --  20*  GLUCOSE 105*  --  99  BUN 10  --  8  CREATININE 0.69  < > 0.61  CALCIUM 7.9*  --  8.3*  MG  --   < > 2.0  AST 24  --   --   ALT 10*  --   --  ALKPHOS 37*  --   --   BILITOT 0.7  --   --   < > = values in this interval not displayed.  Cardiac Enzymes  Recent Labs Lab 02/20/16 2050  TROPONINI <0.03    Microbiology Results  Results for orders placed or performed during the hospital encounter of 02/20/16  Blood Culture (routine x 2)     Status: None (Preliminary result)   Collection Time: 02/20/16  8:50 PM  Result Value Ref Range Status   Specimen Description BLOOD LEFT HAND  Final   Special Requests BOTTLES DRAWN AEROBIC AND ANAEROBIC 1CC  Final   Culture NO GROWTH < 12 HOURS  Final   Report Status PENDING  Incomplete  Blood Culture (routine x 2)     Status: None (Preliminary result)   Collection Time: 02/20/16  8:51 PM  Result Value Ref Range Status   Specimen Description BLOOD RIGHT AC  Final   Special Requests BOTTLES DRAWN AEROBIC AND ANAEROBIC AER6CC ANA5CC  Final   Culture NO GROWTH < 12 HOURS  Final   Report Status PENDING  Incomplete    RADIOLOGY:  Dg Chest Portable 1 View  Result Date: 02/20/2016 CLINICAL DATA:  Shortness of breath and weakness. Possible overdose. EXAM: PORTABLE CHEST 1 VIEW COMPARISON:  Chest radiograph 02/03/2014. FINDINGS: Asymmetric opacity RIGHT mid and lower lung zone. Chronic elevation RIGHT hemidiaphragm with blunting. No similar findings on the LEFT. Early pneumonia could have this appearance. Correlate  clinically for aspiration. Normal cardiomediastinal silhouette. No osseous findings. IMPRESSION: Asymmetric opacity in the RIGHT mid to lower lung zone. Suspected pneumonia, query aspiration. Electronically Signed   By: Elsie StainJohn T Curnes M.D.   On: 02/20/2016 21:53     Management plans discussed with the patient, family and they are in agreement.  CODE STATUS:     Code Status Orders        Start     Ordered   02/21/16 0330  Full code  Continuous     02/21/16 0329    Code Status History    Date Active Date Inactive Code Status Order ID Comments User Context   This patient has a current code status but no historical code status.      TOTAL TIME TAKING CARE OF THIS PATIENT: 33 minutes.    Elia Keenum,  Mardi MainlandDavid K M.D on 02/22/2016 at 9:04 AM  Between 7am to 6pm - Pager - [phone]  After 6pm go to [email or web address] - Scientist, research (life sciences)password [email or web address]  [facility]  Hospitalists  Office  [phone]  CC: Primary care physician; Lyndon CodeKHAN, FOZIA M, MD

## 2016-02-22 NOTE — Plan of Care (Signed)
Problem: Education: Goal: Knowledge of [facility] Education information/materials will improve Outcome: Progressing VSS, free of falls during shift.  93% on RA.  Ambulated to bathroom multiple times during shift, independent.  Denies pain, nausea.  Call bell within reach, WCTM.

## 2016-02-22 NOTE — Progress Notes (Signed)
PHARMACIST - PHYSICIAN COMMUNICATION DR:   Hower CONCERNING: Antibiotic IV to Oral Route Change Policy  RECOMMENDATION: This patient is receiving azithromycin by the intravenous route.  Based on criteria approved by the Pharmacy and Therapeutics Committee, the antibiotic(s) is/are being converted to the equivalent oral dose form(s).   DESCRIPTION: These criteria include:  Patient being treated for a respiratory tract infection, urinary tract infection, cellulitis or clostridium difficile associated diarrhea if on metronidazole  The patient is not neutropenic and does not exhibit a GI malabsorption state  The patient is eating (either orally or via tube) and/or has been taking other orally administered medications for a least 24 hours  The patient is improving clinically and has a Tmax < 100.5  If you have questions about this conversion, please contact the Pharmacy Department   Sandra Stout, PharmD 02/22/16 7:56 AM

## 2016-02-25 LAB — CULTURE, BLOOD (ROUTINE X 2)
CULTURE: NO GROWTH
Culture: NO GROWTH

## 2016-02-26 LAB — CULTURE, BLOOD (ROUTINE X 2)
CULTURE: NO GROWTH
CULTURE: NO GROWTH

## 2016-12-10 ENCOUNTER — Encounter: Payer: Self-pay | Admitting: Emergency Medicine

## 2016-12-10 ENCOUNTER — Emergency Department: Payer: Medicare Other

## 2016-12-10 ENCOUNTER — Emergency Department
Admission: EM | Admit: 2016-12-10 | Discharge: 2016-12-10 | Disposition: A | Discharge status: Home / Self Care | Payer: Medicare Other | Attending: Emergency Medicine | Admitting: Emergency Medicine

## 2016-12-10 DIAGNOSIS — M25512 Pain in left shoulder: Secondary | ICD-10-CM

## 2016-12-10 DIAGNOSIS — Y93E9 Activity, other interior property and clothing maintenance: Secondary | ICD-10-CM | POA: Diagnosis not present

## 2016-12-10 DIAGNOSIS — S46912A Strain of unspecified muscle, fascia and tendon at shoulder and upper arm level, left arm, initial encounter: Secondary | ICD-10-CM | POA: Insufficient documentation

## 2016-12-10 DIAGNOSIS — F17210 Nicotine dependence, cigarettes, uncomplicated: Secondary | ICD-10-CM | POA: Insufficient documentation

## 2016-12-10 DIAGNOSIS — Z79899 Other long term (current) drug therapy: Secondary | ICD-10-CM | POA: Insufficient documentation

## 2016-12-10 DIAGNOSIS — X500XXA Overexertion from strenuous movement or load, initial encounter: Secondary | ICD-10-CM | POA: Insufficient documentation

## 2016-12-10 DIAGNOSIS — Y999 Unspecified external cause status: Secondary | ICD-10-CM | POA: Insufficient documentation

## 2016-12-10 DIAGNOSIS — S161XXA Strain of muscle, fascia and tendon at neck level, initial encounter: Secondary | ICD-10-CM | POA: Diagnosis not present

## 2016-12-10 DIAGNOSIS — S4992XA Unspecified injury of left shoulder and upper arm, initial encounter: Secondary | ICD-10-CM | POA: Diagnosis present

## 2016-12-10 DIAGNOSIS — Y92009 Unspecified place in unspecified non-institutional (private) residence as the place of occurrence of the external cause: Secondary | ICD-10-CM | POA: Insufficient documentation

## 2016-12-10 MED ORDER — CYCLOBENZAPRINE HCL 10 MG PO TABS
5.0000 mg | ORAL_TABLET | Freq: Once | ORAL | Status: AC
  Administered 2016-12-10: 5 mg via ORAL
  Filled 2016-12-10: qty 1

## 2016-12-10 MED ORDER — DEXAMETHASONE SODIUM PHOSPHATE 10 MG/ML IJ SOLN
10.0000 mg | Freq: Once | INTRAMUSCULAR | Status: AC
  Administered 2016-12-10: 10 mg via INTRAMUSCULAR
  Filled 2016-12-10: qty 1

## 2016-12-10 MED ORDER — PREDNISONE 10 MG (21) PO TBPK: ORAL_TABLET | ORAL | 0 refills | Status: DC

## 2016-12-10 NOTE — ED Triage Notes (Signed)
Pt reports she was cleaning and moving object on Saturday and since she has had pain in the left forearm that radiates into the left shoulder and back. Pt denies fall/injury. Pt has bruise to the left elbow area, unknown origin. Pt able to move extremity but with pain.

## 2016-12-10 NOTE — Discharge Instructions (Signed)
Take medication as prescribed. Return to emergency department if symptoms worsen and follow-up with PCP as needed.   °

## 2016-12-10 NOTE — ED Provider Notes (Signed)
[facility] Emergency Department Provider Note   ____________________________________________   I have reviewed the triage vital signs and the nursing notes.   HISTORY  Chief Complaint Arm Injury    HPI Sandra Stout is a 71 y.o. female presents to emergency department with left shoulder informed pain that developed after she was cleaning and lifting objects this past Saturday. Patient reports pain radiates from the forearm up to her neck. She describes it as aching and sharp. She also endorses pain along the posterior scapula. Patient denies any past left shoulder or cervical spine injuries or recent falls or injuries. Patient reports pain worsens with attempting to lift elevate or abduct her shoulder greater than 90 and she is unable to reach behind her back. Patient denies any sense of dislocation or subluxation of the left shoulder joint. Patient reports worsening of pain with cervical spine movement. Patient denies fever, chills, headache, vision changes, chest pain, chest tightness, shortness of breath, abdominal pain, nausea and vomiting.  Past Medical History:  Diagnosis Date  . ADD (attention deficit disorder)   . Anxiety   . Hemorrhoid   . Hypokalemia   . Narcolepsy   . Panic attacks   . Pneumonia     Patient Active Problem List   Diagnosis Date Noted  . Sepsis due to pneumonia (HCC) 02/21/2016  . Chronic back pain 08/17/2014  . GERD (gastroesophageal reflux disease) 08/17/2014  . Chronic fatigue 08/17/2014  . ADD (attention deficit disorder)   . Anxiety   . Narcolepsy     Past Surgical History:  Procedure Laterality Date  . TUBAL LIGATION      Prior to Admission medications   Medication Sig Start Date End Date Taking? Authorizing Provider  albuterol (PROVENTIL HFA;VENTOLIN HFA) 108 (90 BASE) MCG/ACT inhaler Inhale into the lungs every 6 (six) hours as needed for wheezing or shortness of breath.    [provider]    ALPRAZolam Prudy Feeler) 0.5 MG tablet Take by mouth. 06/20/14   [provider]  b complex vitamins tablet Take 1 tablet by mouth daily.    [provider]  calcium carbonate 200 MG capsule Take 250 mg by mouth 2 (two) times daily with a meal.    [provider]  guaiFENesin (MUCINEX) 600 MG 12 hr tablet Take 1 tablet (600 mg total) by mouth 2 (two) times daily. 02/22/16   Hower, Cletis Athens, MD  levofloxacin (LEVAQUIN) 500 MG tablet Take 1 tablet (500 mg total) by mouth daily. 02/22/16   Hower, Cletis Athens, MD  methylphenidate (RITALIN) 20 MG tablet Take 20 mg by mouth 4 (four) times daily.    [provider]  Multiple Vitamins-Minerals (CENTRUM SILVER PO) Take by mouth.    [provider]  Omega-3 Fatty Acids (FISH OIL) 1000 MG CAPS Take by mouth.    [provider]  oxybutynin (DITROPAN-XL) 10 MG 24 hr tablet Take 1 tablet (10 mg total) by mouth at bedtime. 02/22/16   Hower, Cletis Athens, MD  potassium chloride (K-DUR) 10 MEQ tablet Take 1 tablet (10 mEq total) by mouth daily. 02/22/16   Hower, Cletis Athens, MD  predniSONE (DELTASONE) 10 MG tablet  x1 day,  x2day,  x2 day then stop 02/22/16   Hower, Cletis Athens, MD  predniSONE (STERAPRED UNI-PAK 21 TAB) 10 MG (21) TBPK tablet Take 6 tablets on day 1. Take 5 tablets on day 2. Take 4 tablets on day 3. Take 3 tablets on day 4. Take 2  tablets on day 5. Take 1 tablets on day 6. 12/10/16   Lavarius Doughten M, PA-C  ranitidine (ZANTAC) 300 MG tablet Take 0.5 tablets (150 mg total) by mouth at bedtime. 08/17/14   Olevia Perches P, DO  venlafaxine XR (EFFEXOR-XR) 75 MG 24 hr capsule Take 3 capsules (225 mg total) by mouth daily with breakfast. Patient not taking: Reported on 02/20/2016 08/17/14   [name], DO    Allergies Patient has no known allergies.  Family History  Problem Relation Age of Onset  . Lung disease Mother   . Thyroid disease Sister   . Lung disease Sister   . Cancer Sister        Bladder   . Heart disease Father     Social History Social History  Substance Use Topics  . Smoking status: Current Every Day Smoker    Packs/day: 0.50    Types: Cigarettes  . Smokeless tobacco: Never Used  . Alcohol use No    Review of Systems Constitutional: Negative for fever/chills Cardiovascular: Denies chest pain. Respiratory: Denies cough. Denies shortness of breath. Musculoskeletal: left shoulder and forearm pain and left side cervical pain. Skin: Negative for rash. Neurological: Negative for headaches.  \____________________________________________   PHYSICAL EXAM:  VITAL SIGNS: ED Triage Vitals  Enc Vitals Group     BP 12/10/16 2053 138/78     Pulse Rate 12/10/16 2053 75     Resp 12/10/16 2053 17     Temp 12/10/16 2053 98.3 F (36.8 C)     Temp Source 12/10/16 2053 Oral     SpO2 12/10/16 2053 97 %     Weight 12/10/16 2051 111 lb (50.3 kg)     Height --      Head Circumference --      Peak Flow --      Pain Score --      Pain Loc --      Pain Edu? --      Excl. in GC? --     Constitutional: Alert and oriented. Well appearing and in no acute distress.  Head: Normocephalic and atraumatic. Cardiovascular: Normal rate, regular rhythm.  Respiratory: Normal respiratory effort without tachypnea or retractions.  Gastrointestinal: Bowel sounds 4 quadrants.  Musculoskeletal: Cervical spine range of motion all planes intact without limitation or deformities. Spinous processes nontender. Symptoms were reproducible with active and range cervical left rotation and extension. Left shoulder range of motion within functional limits with limitations due to pain. Palpable tenderness along left upper trapezius, supraspinatus and infraspinatus. Due to global pain rotator cuff testing was inconclusive. Neurologic: Normal speech and language.  Skin:  Skin is warm, dry and intact. No rash noted. Psychiatric: Mood and affect are normal. Speech and behavior are normal. Patient exhibits  appropriate insight and judgement.  ____________________________________________   LABS (all labs ordered are listed, but only abnormal results are displayed)  Labs Reviewed - No data to display ____________________________________________  EKG none ____________________________________________  RADIOLOGY DG shoulder left FINDINGS: There is no evidence of fracture or dislocation. There is no evidence of arthropathy or other focal bone abnormality. Soft tissues are unremarkable.  IMPRESSION: Negative.   ____________________________________________   PROCEDURES  Procedure(s) performed: no    Critical Care performed: no ____________________________________________   INITIAL IMPRESSION / ASSESSMENT AND PLAN / ED COURSE  Pertinent labs & imaging results that were available during my care of the patient were reviewed by me and considered in my medical decision making (see chart for details).  Patient presents to emergency department with left shoulder and forearm pain and left-sided cervical pain since performing lifting and cleaning activities in her home this past Saturday.  History, physical exam findings, and imaging are reassuring of no acute fracture and symptoms are consistent with acute cervical and shoulder strain.  Patient noted improvement of symptoms following Decadron and Flexeril given during the course of care in the emergency department. Patient will be prescribed prednisone taper. Patient advised to follow up with PCP as needed or return to the emergency department if symptoms return or worsen. Patient informed of clinical course, understand medical decision-making process, and agree with plan. Patient informed of clinical course, understand medical decision-making process, and agree with plan.   ____________________________________________   FINAL CLINICAL IMPRESSION(S) / ED DIAGNOSES  Final diagnoses:  Acute strain of neck muscle, initial encounter    Acute pain of left shoulder  Left shoulder strain, initial encounter       NEW MEDICATIONS STARTED DURING THIS VISIT:  Discharge Medication List as of 12/10/2016  9:56 PM    START taking these medications   Details  predniSONE (STERAPRED UNI-PAK 21 TAB) 10 MG (21) TBPK tablet Take 6 tablets on day 1. Take 5 tablets on day 2. Take 4 tablets on day 3. Take 3 tablets on day 4. Take 2 tablets on day 5. Take 1 tablets on day 6., Print         Note:  This document was prepared using Dragon voice recognition software and may include unintentional dictation errors.    Clois Comber, PA-C 12/10/16 2349    Dionne Bucy, MD 12/11/16 [phone]

## 2016-12-10 NOTE — ED Notes (Signed)
Patient transported to X-ray 

## 2016-12-11 ENCOUNTER — Other Ambulatory Visit: Payer: Self-pay

## 2017-02-13 DIAGNOSIS — G47419 Narcolepsy without cataplexy: Secondary | ICD-10-CM | POA: Diagnosis not present

## 2017-09-08 ENCOUNTER — Encounter: Payer: Self-pay | Admitting: Family Medicine

## 2017-09-15 ENCOUNTER — Other Ambulatory Visit: Payer: Self-pay | Admitting: Internal Medicine

## 2017-09-15 ENCOUNTER — Other Ambulatory Visit: Payer: Self-pay | Admitting: Family Medicine

## 2017-09-15 DIAGNOSIS — Z1231 Encounter for screening mammogram for malignant neoplasm of breast: Secondary | ICD-10-CM

## 2017-09-25 ENCOUNTER — Ambulatory Visit
Admission: RE | Admit: 2017-09-25 | Discharge: 2017-09-25 | Disposition: A | Discharge status: Home / Self Care | Payer: Medicare Other | Source: Ambulatory Visit | Attending: Family Medicine | Admitting: Family Medicine

## 2017-09-25 DIAGNOSIS — Z1231 Encounter for screening mammogram for malignant neoplasm of breast: Secondary | ICD-10-CM | POA: Insufficient documentation

## 2017-09-26 ENCOUNTER — Other Ambulatory Visit: Payer: Self-pay | Admitting: Family Medicine

## 2017-09-26 DIAGNOSIS — N6489 Other specified disorders of breast: Secondary | ICD-10-CM

## 2017-09-26 DIAGNOSIS — R928 Other abnormal and inconclusive findings on diagnostic imaging of breast: Secondary | ICD-10-CM

## 2017-10-10 ENCOUNTER — Ambulatory Visit
Admission: RE | Admit: 2017-10-10 | Discharge: 2017-10-10 | Disposition: A | Discharge status: Home / Self Care | Payer: Medicare Other | Source: Ambulatory Visit | Attending: Family Medicine | Admitting: Family Medicine

## 2017-10-10 DIAGNOSIS — N6489 Other specified disorders of breast: Secondary | ICD-10-CM

## 2017-10-10 DIAGNOSIS — R928 Other abnormal and inconclusive findings on diagnostic imaging of breast: Secondary | ICD-10-CM | POA: Insufficient documentation

## 2017-10-10 DIAGNOSIS — R922 Inconclusive mammogram: Secondary | ICD-10-CM | POA: Diagnosis not present

## 2018-02-03 ENCOUNTER — Telehealth: Payer: Self-pay | Admitting: Family Medicine

## 2018-02-03 ENCOUNTER — Ambulatory Visit (INDEPENDENT_AMBULATORY_CARE_PROVIDER_SITE_OTHER): Payer: Medicare Other | Admitting: Family Medicine

## 2018-02-03 ENCOUNTER — Encounter: Payer: Self-pay | Admitting: Family Medicine

## 2018-02-03 VITALS — BP 141/92 | HR 110 | Temp 98.5°F | Wt 115.0 lb

## 2018-02-03 DIAGNOSIS — F339 Major depressive disorder, recurrent, unspecified: Secondary | ICD-10-CM

## 2018-02-03 DIAGNOSIS — Z13220 Encounter for screening for lipoid disorders: Secondary | ICD-10-CM

## 2018-02-03 DIAGNOSIS — R5382 Chronic fatigue, unspecified: Secondary | ICD-10-CM | POA: Diagnosis not present

## 2018-02-03 DIAGNOSIS — R002 Palpitations: Secondary | ICD-10-CM

## 2018-02-03 DIAGNOSIS — R300 Dysuria: Secondary | ICD-10-CM | POA: Diagnosis not present

## 2018-02-03 DIAGNOSIS — Z748 Other problems related to care provider dependency: Secondary | ICD-10-CM

## 2018-02-03 DIAGNOSIS — Z23 Encounter for immunization: Secondary | ICD-10-CM

## 2018-02-03 DIAGNOSIS — G47419 Narcolepsy without cataplexy: Secondary | ICD-10-CM

## 2018-02-03 DIAGNOSIS — F331 Major depressive disorder, recurrent, moderate: Secondary | ICD-10-CM | POA: Insufficient documentation

## 2018-02-03 DIAGNOSIS — M545 Low back pain: Secondary | ICD-10-CM

## 2018-02-03 DIAGNOSIS — G8929 Other chronic pain: Secondary | ICD-10-CM

## 2018-02-03 DIAGNOSIS — F419 Anxiety disorder, unspecified: Secondary | ICD-10-CM

## 2018-02-03 DIAGNOSIS — F988 Other specified behavioral and emotional disorders with onset usually occurring in childhood and adolescence: Secondary | ICD-10-CM

## 2018-02-03 MED ORDER — VENLAFAXINE HCL ER 75 MG PO CP24: ORAL_CAPSULE | ORAL | 2 refills | Status: DC

## 2018-02-03 MED ORDER — NAPROXEN 500 MG PO TABS: 500.0000 mg | ORAL_TABLET | Freq: Two times a day (BID) | ORAL | 2 refills | Status: DC

## 2018-02-03 NOTE — Assessment & Plan Note (Signed)
Likely due to her mood. Multifactorial. Labs drawn today. Await results.

## 2018-02-03 NOTE — Assessment & Plan Note (Signed)
Continue to follow with neurology. Their notes reviewed today.

## 2018-02-03 NOTE — Assessment & Plan Note (Signed)
Will start naproxen. Continue to monitor. Call with any concerns. Will not treat this patient with controlled substances.

## 2018-02-03 NOTE — Telephone Encounter (Signed)
Please let her know that she does not have a uti

## 2018-02-03 NOTE — Assessment & Plan Note (Signed)
Severe. Long discussion with patient today that I will not treat her anxiety with xanax. Based on the duration and severity of her symptoms, she would benefit from seeing psychiatry. She refuses this. Information about [facility] and pamphlet given to patient today. Offered to arrange transportation- this was refused. Patient very upset. Refusing to see psychiatry. We will restart her effexor at 75mg - she will need to follow up for refills. We will not write her xanax. Will try to get her into see psychiatry when her depression is doing a bit better. Continue to monitor. Recheck 1 month.

## 2018-02-03 NOTE — Assessment & Plan Note (Signed)
Continue to follow with neurology. Their notes reviewed today. 

## 2018-02-03 NOTE — Progress Notes (Addendum)
BP (!) 141/92   Pulse (!) 110   Temp 98.5 F (36.9 C) (Oral)   Wt 115 lb (52.2 kg)   LMP  (LMP Unknown)   SpO2 99%   BMI 21.03 kg/m    Subjective:    Patient ID: Sandra Stout, female    DOB: [birth year], 72 y.o.   MRN: [id number]  HPI: Sandra Stout is a 72 y.o. female who presents today to establish care. Patient was seen here 3.5 years ago 1x, but then never returned. 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 She has been demanding xanax 1mg  3-4x a day, which her neurologist refused to give her. They gave her 0.5mg  2x a day for 6 months in January last year. She is due to follow up with them in January 2020. On review of the PMP patient has been continued on her xanax with her last fill 01/30/18. Today she is demanding to go back on TID 1mg  xanax. She then back-tracked this and is very upset.   Chief Complaint  Patient presents with  . Anxiety    Stressed. Living by her self w/ aging is concerning her.   . Back Pain    lower back pain   Has known narcolepsy- has been following with her neurologist and managed with ritalin.   ANXIETY/STRESS- notes that she has been on 1mg  xanax TID since she was in her 30s. She notes that it has helped her for years. She has seen psychiatrists in the past. She is very tangential and difficult to follow her story. She is concerned about her history of anxiety and states that xanax is the only thing that helps. She has been living in a trailer for about 3 years. Notes that she has been having trouble with the maintenance of her home. She notes that she has a lot of financial issues. She notes that her kids are not helpful with her ADLs or maintenance of her home or finances. She notes that she has a lot of trouble with concentration. She notes that her stress is not due to her lifestyle. Not interested in counseling. She notes that she is very anxious with anything. Has multiple panic attacks a day Duration: chronic Anxious  mood: yes  Excessive worrying: yes Irritability: no  Sweating: yes Nausea: yes Palpitations:yes Hyperventilation: yes Panic attacks: yes Agoraphobia: no  Obscessions/compulsions: no Depressed mood: yes Depression screen [facility] 2/9 02/03/2018 08/17/2014  Decreased Interest 3 3  Down, Depressed, Hopeless 3 3  PHQ - 2 Score 6 6  Altered sleeping 3 0  Tired, decreased energy 3 3  Change in appetite 1 0  Feeling bad or failure about yourself  3 0  Trouble concentrating 3 2  Moving slowly or fidgety/restless 3 3  Suicidal thoughts 0 0  PHQ-9 Score 22 14  Difficult doing work/chores Extremely dIfficult Very difficult   GAD 7 : Generalized Anxiety Score 02/03/2018  Nervous, Anxious, on Edge 3  Control/stop worrying 3  Worry too much - different things 3  Trouble relaxing 3  Restless 0  Easily annoyed or irritable 3  Afraid - awful might happen 3  Total GAD 7 Score 18   Anhedonia: no Weight changes: no Insomnia: yes   Hypersomnia: no Fatigue/loss of energy: yes Feelings of worthlessness: yes Feelings of guilt: yes Impaired concentration/indecisiveness: yes Suicidal ideations: no  Crying spells: yes Recent Stressors/Life Changes: yes   Relationship problems: yes   Family stress: yes  Financial stress: yes    Job stress: no    Recent death/loss: no  Has had chronic back pain since she was in her 40s. Has never tried anything for it. Did PT in the 54s. Has not done anything since.   Active Ambulatory Problems    Diagnosis Date Noted  . ADD (attention deficit disorder)   . Anxiety   . 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  . Chronic back pain 08/17/2014  . GERD (gastroesophageal reflux disease) 08/17/2014  . Chronic fatigue 08/17/2014  . Depression, recurrent (HCC) 02/03/2018   Resolved Ambulatory Problems    Diagnosis Date Noted  . Sepsis due to pneumonia (HCC) 02/21/2016   Past Medical History:  Diagnosis Date  . Hemorrhoid   . Hypokalemia   .  Narcolepsy   . Panic attacks   . Pneumonia    Past Surgical History:  Procedure Laterality Date  . TUBAL LIGATION     Outpatient Encounter Medications as of 02/03/2018  Medication Sig  . ALPRAZolam (XANAX) 0.5 MG tablet Take 0.5 mg by mouth 2 (two) times daily as needed.   . calcium carbonate 200 MG capsule Take 250 mg by mouth 2 (two) times daily with a meal.  . methylphenidate (RITALIN) 20 MG tablet Take 20 mg by mouth 4 (four) times daily.  . Multiple Vitamins-Minerals (CENTRUM SILVER PO) Take by mouth.  . Omega-3 Fatty Acids (FISH OIL) 1000 MG CAPS Take by mouth.  . potassium chloride (K-DUR) 10 MEQ tablet Take 1 tablet (10 mEq total) by mouth daily.  [name] albuterol (PROVENTIL HFA;VENTOLIN HFA) 108 (90 BASE) MCG/ACT inhaler Inhale into the lungs every 6 (six) hours as needed for wheezing or shortness of breath.  [name] b complex vitamins tablet Take 1 tablet by mouth daily.  . naproxen (NAPROSYN) 500 MG tablet Take 1 tablet (500 mg total) by mouth 2 (two) times daily with a meal.  . venlafaxine XR (EFFEXOR-XR) 75 MG 24 hr capsule 1 pill daily for 2 weeks, then increase to 2 tabs daily  . [DISCONTINUED] guaiFENesin (MUCINEX) 600 MG 12 hr tablet Take 1 tablet (600 mg total) by mouth 2 (two) times daily. (Patient not taking: Reported on 02/03/2018)  . [DISCONTINUED] levofloxacin (LEVAQUIN) 500 MG tablet Take 1 tablet (500 mg total) by mouth daily. (Patient not taking: Reported on 02/03/2018)  . [DISCONTINUED] oxybutynin (DITROPAN-XL) 10 MG 24 hr tablet Take 1 tablet (10 mg total) by mouth at bedtime. (Patient not taking: Reported on 02/03/2018)  . [DISCONTINUED] predniSONE (DELTASONE) 10 MG tablet 40mg  x1 day, 20mg  x2day, 10mg  x2 day then stop (Patient not taking: Reported on 02/03/2018)  . [DISCONTINUED] predniSONE (STERAPRED UNI-PAK 21 TAB) 10 MG (21) TBPK tablet Take 6 tablets on day 1. Take 5 tablets on day 2. Take 4 tablets on day 3. Take 3 tablets on day 4. Take 2 tablets on day 5. Take 1  tablets on day 6. (Patient not taking: Reported on 02/03/2018)  . [DISCONTINUED] ranitidine (ZANTAC) 300 MG tablet Take 0.5 tablets (150 mg total) by mouth at bedtime. (Patient not taking: Reported on 02/03/2018)  . [DISCONTINUED] venlafaxine XR (EFFEXOR-XR) 75 MG 24 hr capsule Take 3 capsules (225 mg total) by mouth daily with breakfast. (Patient not taking: Reported on 02/20/2016)   No facility-administered encounter medications on file as of 02/03/2018.    No Known Allergies  Social History   Socioeconomic History  . Marital status: Married    Spouse name: Not on file  . Number of children: Not  on file  . Years of education: Not on file  . Highest education level: Not on file  Occupational History  . Not on file  Social Needs  . Financial resource strain: Not on file  . Food insecurity:    Worry: Not on file    Inability: Not on file  . Transportation needs:    Medical: Not on file    Non-medical: Not on file  Tobacco Use  . Smoking status: Current Every Day Smoker    Packs/day: 0.50    Types: Cigarettes  . Smokeless tobacco: Never Used  Substance and Sexual Activity  . Alcohol use: No  . Drug use: No  . Sexual activity: Never  Lifestyle  . Physical activity:    Days per week: Not on file    Minutes per session: Not on file  . Stress: Not on file  Relationships  . Social connections:    Talks on phone: Not on file    Gets together: Not on file    Attends religious service: Not on file    Active member of club or organization: Not on file    Attends meetings of clubs or organizations: Not on file    Relationship status: Not on file  Other Topics Concern  . Not on file  Social History Narrative  . Not on file   Family History  Problem Relation Age of Onset  . Lung disease Mother   . Thyroid disease Sister   . Lung disease Sister   . Cancer Sister        Bladder  . Heart disease Father   . Breast cancer Neg Hx     Review of Systems  Constitutional:  Negative.   Respiratory: Negative.   Cardiovascular: Negative.   Musculoskeletal: Positive for back pain and myalgias. Negative for arthralgias, gait problem, joint swelling, neck pain and neck stiffness.  Skin: Negative.   Neurological: Negative.   Psychiatric/Behavioral: Positive for decreased concentration and dysphoric mood. Negative for agitation, behavioral problems, confusion, hallucinations, self-injury, sleep disturbance and suicidal ideas. The patient is nervous/anxious. The patient is not hyperactive.    Per HPI unless specifically indicated above     Objective:    BP (!) 141/92   Pulse (!) 110   Temp 98.5 F (36.9 C) (Oral)   Wt 115 lb (52.2 kg)   LMP  (LMP Unknown)   SpO2 99%   BMI 21.03 kg/m   Wt Readings from Last 3 Encounters:  02/03/18 115 lb (52.2 kg)  12/10/16 111 lb (50.3 kg)  02/20/16 111 lb (50.3 kg)    Physical Exam  Constitutional: She is oriented to person, place, and time. She appears well-developed and well-nourished. No distress.  HENT:  Head: Normocephalic and atraumatic.  Right Ear: Hearing normal.  Left Ear: Hearing normal.  Nose: Nose normal.  Eyes: Conjunctivae and lids are normal. Right eye exhibits no discharge. Left eye exhibits no discharge. No scleral icterus.  Cardiovascular: Normal rate, regular rhythm, normal heart sounds and intact distal pulses. Exam reveals no gallop and no friction rub.  No murmur heard. Pulmonary/Chest: Effort normal and breath sounds normal. No stridor. No respiratory distress. She has no wheezes. She has no rales. She exhibits no tenderness.  Musculoskeletal: Normal range of motion.  Neurological: She is alert and oriented to person, place, and time.  Skin: Skin is warm, dry and intact. Capillary refill takes less than 2 seconds. No rash noted. She is not diaphoretic. No erythema. No pallor.  Psychiatric: Her behavior is normal. Judgment and thought content normal. Her mood appears anxious. Her speech is  tangential. Cognition and memory are normal.  Nursing note and vitals reviewed.   Results for orders placed or performed during the hospital encounter of 02/20/16  Blood Culture (routine x 2)  Result Value Ref Range   Specimen Description BLOOD LEFT HAND    Special Requests BOTTLES DRAWN AEROBIC AND ANAEROBIC 1CC    Culture NO GROWTH 5 DAYS    Report Status 02/25/2016 FINAL   Blood Culture (routine x 2)  Result Value Ref Range   Specimen Description BLOOD RIGHT AC    Special Requests BOTTLES DRAWN AEROBIC AND ANAEROBIC AER6CC ANA5CC    Culture NO GROWTH 5 DAYS    Report Status 02/25/2016 FINAL   Urine culture  Result Value Ref Range   Specimen Description URINE, RANDOM    Special Requests NONE    Culture (A)     <10,000 COLONIES/mL INSIGNIFICANT GROWTH [facility]    Report Status 02/22/2016 FINAL   Culture, blood (routine x 2) Call MD if unable to obtain prior to antibiotics being given  Result Value Ref Range   Specimen Description BLOOD RIGHT ASSIST CONTROL    Special Requests      BOTTLES DRAWN AEROBIC AND ANAEROBIC ANA 6ML AND AERO 8ML   Culture NO GROWTH 5 DAYS    Report Status 02/26/2016 FINAL   Culture, blood (routine x 2) Call MD if unable to obtain prior to antibiotics being given  Result Value Ref Range   Specimen Description BLOOD RIGHT HAND    Special Requests      BOTTLES DRAWN AEROBIC AND ANAEROBIC AER1ML AND ANA 1ML   Culture NO GROWTH 5 DAYS    Report Status 02/26/2016 FINAL   CBC WITH DIFFERENTIAL  Result Value Ref Range   WBC 8.8 3.6 - 11.0 K/uL   RBC 4.13 3.80 - 5.20 MIL/uL   Hemoglobin 13.2 12.0 - 16.0 g/dL   HCT 16.137.9 09.635.0 - 04.547.0 %   MCV 91.9 80.0 - 100.0 fL   MCH 32.0 26.0 - 34.0 pg   MCHC 34.9 32.0 - 36.0 g/dL   RDW 40.915.1 (H) 81.111.5 - 91.414.5 %   Platelets 305 150 - 440 K/uL   Neutrophils Relative % 75 %   Neutro Abs 6.6 (H) 1.4 - 6.5 K/uL   Lymphocytes Relative 18 %   Lymphs Abs 1.5 1.0 - 3.6 K/uL   Monocytes Relative 6 %     Monocytes Absolute 0.5 0.2 - 0.9 K/uL   Eosinophils Relative 0 %   Eosinophils Absolute 0.0 0 - 0.7 K/uL   Basophils Relative 1 %   Basophils Absolute 0.0 0 - 0.1 K/uL  Urinalysis, Routine w reflex microscopic  Result Value Ref Range   Color, Urine STRAW (A) YELLOW   APPearance CLEAR (A) CLEAR   Specific Gravity, Urine 1.014 1.005 - 1.030   pH 6.0 5.0 - 8.0   Glucose, UA NEGATIVE NEGATIVE mg/dL   Hgb urine dipstick MODERATE (A) NEGATIVE   Bilirubin Urine NEGATIVE NEGATIVE   Ketones, ur 5 (A) NEGATIVE mg/dL   Protein, ur NEGATIVE NEGATIVE mg/dL   Nitrite NEGATIVE NEGATIVE   Leukocytes, UA TRACE (A) NEGATIVE   RBC / HPF 0-5 0 - 5 RBC/hpf   WBC, UA 0-5 0 - 5 WBC/hpf   Bacteria, UA NONE SEEN NONE SEEN   Squamous Epithelial / LPF 0-5 (A) NONE SEEN   Mucus PRESENT  Lactic acid, plasma  Result Value Ref Range   Lactic Acid, Venous 1.7 0.5 - 1.9 mmol/L  Lactic acid, plasma  Result Value Ref Range   Lactic Acid, Venous 0.9 0.5 - 1.9 mmol/L  Troponin I  Result Value Ref Range   Troponin I <0.03 <0.03 ng/mL  Influenza panel by PCR (type A & B, H1N1)  Result Value Ref Range   Influenza A By PCR NEGATIVE NEGATIVE   Influenza B By PCR NEGATIVE NEGATIVE  Brain natriuretic peptide  Result Value Ref Range   B Natriuretic Peptide 35.0 0.0 - 100.0 pg/mL  Comprehensive metabolic panel  Result Value Ref Range   Sodium 140 135 - 145 mmol/L   Potassium 3.0 (L) 3.5 - 5.1 mmol/L   Chloride 112 (H) 101 - 111 mmol/L   CO2 22 22 - 32 mmol/L   Glucose, Bld 105 (H) 65 - 99 mg/dL   BUN 10 6 - 20 mg/dL   Creatinine, Ser 0.98 0.44 - 1.00 mg/dL   Calcium 7.9 (L) 8.9 - 10.3 mg/dL   Total Protein 5.4 (L) 6.5 - 8.1 g/dL   Albumin 3.1 (L) 3.5 - 5.0 g/dL   AST 24 15 - 41 U/L   ALT 10 (L) 14 - 54 U/L   Alkaline Phosphatase 37 (L) 38 - 126 U/L   Total Bilirubin 0.7 0.3 - 1.2 mg/dL   GFR calc non Af Amer >60 >60 mL/min   GFR calc Af Amer >60 >60 mL/min   Anion gap 6 5 - 15  Urine Drug Screen,  Qualitative ([facility] only)  Result Value Ref Range   Tricyclic, Ur Screen NONE DETECTED NONE DETECTED   Amphetamines, Ur Screen NONE DETECTED NONE DETECTED   MDMA (Ecstasy)Ur Screen NONE DETECTED NONE DETECTED   Cocaine Metabolite,Ur [place] NONE DETECTED NONE DETECTED   Opiate, Ur Screen NONE DETECTED NONE DETECTED   Phencyclidine (PCP) Ur S NONE DETECTED NONE DETECTED   Cannabinoid 50 Ng, Ur [place] NONE DETECTED NONE DETECTED   Barbiturates, Ur Screen NONE DETECTED NONE DETECTED   Benzodiazepine, Ur Scrn POSITIVE (A) NONE DETECTED   Methadone Scn, Ur NONE DETECTED NONE DETECTED  Strep pneumoniae urinary antigen  Result Value Ref Range   Strep Pneumo Urinary Antigen NEGATIVE NEGATIVE  CBC  Result Value Ref Range   WBC 9.5 3.6 - 11.0 K/uL   RBC 3.56 (L) 3.80 - 5.20 MIL/uL   Hemoglobin 11.2 (L) 12.0 - 16.0 g/dL   HCT 11.9 (L) 14.7 - 82.9 %   MCV 95.7 80.0 - 100.0 fL   MCH 31.6 26.0 - 34.0 pg   MCHC 33.0 32.0 - 36.0 g/dL   RDW 56.2 13.0 - 86.5 %   Platelets 231 150 - 440 K/uL  Creatinine, serum  Result Value Ref Range   Creatinine, Ser 0.75 0.44 - 1.00 mg/dL   GFR calc non Af Amer >60 >60 mL/min   GFR calc Af Amer >60 >60 mL/min  Legionella Pneumophila Serogp 1 Ur Ag  Result Value Ref Range   L. pneumophila Serogp 1 Ur Ag Negative Negative   Source of Sample URINE, RANDOM   Magnesium  Result Value Ref Range   Magnesium 1.4 (L) 1.7 - 2.4 mg/dL  Basic metabolic panel  Result Value Ref Range   Sodium 139 135 - 145 mmol/L   Potassium 3.7 3.5 - 5.1 mmol/L   Chloride 115 (H) 101 - 111 mmol/L   CO2 20 (L) 22 - 32 mmol/L   Glucose, Bld 99 65 -  99 mg/dL   BUN 8 6 - 20 mg/dL   Creatinine, Ser 9.14 0.44 - 1.00 mg/dL   Calcium 8.3 (L) 8.9 - 10.3 mg/dL   GFR calc non Af Amer >60 >60 mL/min   GFR calc Af Amer >60 >60 mL/min   Anion gap 4 (L) 5 - 15  Magnesium  Result Value Ref Range   Magnesium 2.0 1.7 - 2.4 mg/dL      Assessment & Plan:   Problem List Items Addressed This Visit       Other   ADD (attention deficit disorder)    Continue to follow with neurology. Their notes reviewed today.      Anxiety    Severe. Long discussion with patient today that I will not treat her anxiety with xanax. Based on the duration and severity of her symptoms, she would benefit from seeing psychiatry. She refuses this. Information about [facility] and pamphlet given to patient today. Offered to arrange transportation- this was refused. Patient very upset. Refusing to see psychiatry. We will restart her effexor at 75mg - she will need to follow up for refills. We will not write her xanax. Will try to get her into see psychiatry when her depression is doing a bit better. Continue to monitor. Recheck 1 month.       Relevant Medications   venlafaxine XR (EFFEXOR-XR) 75 MG 24 hr capsule   Primary narcolepsy without cataplexy    Continue to follow with neurology. Their notes reviewed today.      Chronic back pain    Will start naproxen. Continue to monitor. Call with any concerns. Will not treat this patient with controlled substances.       Relevant Medications   naproxen (NAPROSYN) 500 MG tablet   Chronic fatigue    Likely due to her mood. Multifactorial. Labs drawn today. Await results.       Relevant Medications   venlafaxine XR (EFFEXOR-XR) 75 MG 24 hr capsule   Depression, recurrent (HCC) - Primary    Severe. Long discussion with patient today that I will not treat her anxiety with xanax. Based on the duration and severity of her symptoms, she would benefit from seeing psychiatry. She refuses this. Information about [facility] and pamphlet given to patient today. Offered to arrange transportation- this was refused. Patient very upset. Refusing to see psychiatry. We will restart her effexor at 75mg - she will need to follow up for refills. We will not write her xanax. Will try to get her into see psychiatry when her depression is doing a bit better. Continue to monitor. Recheck 1 month.        Relevant Medications   venlafaxine XR (EFFEXOR-XR) 75 MG 24 hr capsule   Other Relevant Orders   CBC with Differential/Platelet   Comprehensive metabolic panel   TSH   VITAMIN D 25 Hydroxy (Vit-D Deficiency, Fractures)    Other Visit Diagnoses    Dysuria       Labs drawn today. Await results.    Relevant Orders   UA/M w/rflx Culture, Routine   Screening for cholesterol level       Labs drawn today. Await results.    Relevant Orders   Lipid Panel w/o Chol/HDL Ratio   Palpitations       Labs drawn today. Await results.    Relevant Medications   venlafaxine XR (EFFEXOR-XR) 75 MG 24 hr capsule   Immunization due       Flu and prevnar given today.   Relevant Orders  Flu vaccine HIGH DOSE PF (Fluzone High dose) (Completed)   Pneumococcal conjugate vaccine 13-valent (Completed)       Follow up plan: Return in about 4 weeks (around 03/03/2018).  Greater than of a 15 minute scheduled appointment spent in counseling and coordination of care with patient today.

## 2018-02-03 NOTE — Telephone Encounter (Signed)
Patient notified

## 2018-02-03 NOTE — Assessment & Plan Note (Signed)
>>  ASSESSMENT AND PLAN FOR DEPRESSION, RECURRENT (HCC) WRITTEN ON 02/03/2018  4:29 PM BY JOHNSON, MEGAN P, DO  Severe. Long discussion with patient today that I will not treat her anxiety with xanax. Based on the duration and severity of her symptoms, she would benefit from seeing psychiatry. She refuses this. Information about [facility] and pamphlet given to patient today. Offered to arrange transportation- this was refused. Patient very upset. Refusing to see psychiatry. We will restart her effexor at 75mg - she will need to follow up for refills. We will not write her xanax. Will try to get her into see psychiatry when her depression is doing a bit better. Continue to monitor. Recheck 1 month.

## 2018-02-03 NOTE — Assessment & Plan Note (Signed)
Severe. Long discussion with patient today that I will not treat her anxiety with xanax. Based on the duration and severity of her symptoms, she would benefit from seeing psychiatry. She refuses this. Information about [facility] and pamphlet given to patient today. Offered to arrange transportation- this was refused. Patient very upset. Refusing to see psychiatry. We will restart her effexor at 75mg- she will need to follow up for refills. We will not write her xanax. Will try to get her into see psychiatry when her depression is doing a bit better. Continue to monitor. Recheck 1 month.  

## 2018-02-04 ENCOUNTER — Encounter: Payer: Self-pay | Admitting: Family Medicine

## 2018-02-04 LAB — COMPREHENSIVE METABOLIC PANEL
ALT: 9 IU/L (ref 0–32)
AST: 14 IU/L (ref 0–40)
Albumin/Globulin Ratio: 2.3 — ABNORMAL HIGH (ref 1.2–2.2)
Albumin: 4.6 g/dL (ref 3.5–4.8)
Alkaline Phosphatase: 80 IU/L (ref 39–117)
BUN / CREAT RATIO: 16 (ref 12–28)
BUN: 13 mg/dL (ref 8–27)
CHLORIDE: 105 mmol/L (ref 96–106)
CO2: 22 mmol/L (ref 20–29)
CREATININE: 0.79 mg/dL (ref 0.57–1.00)
Calcium: 9.9 mg/dL (ref 8.7–10.3)
GFR calc Af Amer: 86 mL/min/{1.73_m2} (ref >59)
GFR calc non Af Amer: 74 mL/min/{1.73_m2} (ref >59)
GLUCOSE: 91 mg/dL (ref 65–99)
Globulin, Total: 2 g/dL (ref 1.5–4.5)
Potassium: 3.8 mmol/L (ref 3.5–5.2)
SODIUM: 142 mmol/L (ref 134–144)
Total Protein: 6.6 g/dL (ref 6.0–8.5)

## 2018-02-04 LAB — TSH: TSH: 1.92 u[IU]/mL (ref 0.450–4.500)

## 2018-02-04 LAB — CBC WITH DIFFERENTIAL/PLATELET
Basophils Absolute: 0.1 10*3/uL (ref 0.0–0.2)
Basos: 1 %
EOS (ABSOLUTE): 0.1 10*3/uL (ref 0.0–0.4)
EOS: 1 %
HEMOGLOBIN: 14 g/dL (ref 11.1–15.9)
Hematocrit: 42.4 % (ref 34.0–46.6)
Immature Grans (Abs): 0 10*3/uL (ref 0.0–0.1)
Immature Granulocytes: 0 %
LYMPHS ABS: 3.4 10*3/uL — AB (ref 0.7–3.1)
Lymphs: 52 %
MCH: 30.6 pg (ref 26.6–33.0)
MCHC: 33 g/dL (ref 31.5–35.7)
MCV: 93 fL (ref 79–97)
MONOCYTES: 8 %
Monocytes Absolute: 0.5 10*3/uL (ref 0.1–0.9)
NEUTROS ABS: 2.5 10*3/uL (ref 1.4–7.0)
Neutrophils: 38 %
PLATELETS: 391 10*3/uL (ref 150–450)
RBC: 4.58 x10E6/uL (ref 3.77–5.28)
RDW: 12.2 % — AB (ref 12.3–15.4)
WBC: 6.4 10*3/uL (ref 3.4–10.8)

## 2018-02-04 LAB — VITAMIN D 25 HYDROXY (VIT D DEFICIENCY, FRACTURES): VIT D 25 HYDROXY: 37 ng/mL (ref 30.0–100.0)

## 2018-02-04 LAB — LIPID PANEL W/O CHOL/HDL RATIO
Cholesterol, Total: 248 mg/dL — ABNORMAL HIGH (ref 100–199)
HDL: 67 mg/dL (ref >39)
LDL CALC: 162 mg/dL — AB (ref 0–99)
TRIGLYCERIDES: 94 mg/dL (ref 0–149)
VLDL Cholesterol Cal: 19 mg/dL (ref 5–40)

## 2018-02-04 NOTE — Addendum Note (Signed)
Addended by: Dorcas CarrowJOHNSON, MEGAN P on: 02/04/2018 12:14 PM   Modules accepted: Level of Service

## 2018-03-16 LAB — MICROSCOPIC EXAMINATION: RBC, UA: NONE SEEN /hpf (ref 0–2)

## 2018-03-16 LAB — UA/M W/RFLX CULTURE, ROUTINE
Bilirubin, UA: NEGATIVE
GLUCOSE, UA: NEGATIVE
KETONES UA: NEGATIVE
PROTEIN UA: NEGATIVE
RBC UA: NEGATIVE
Specific Gravity, UA: 1.015 (ref 1.005–1.030)
UUROB: 0.2 mg/dL (ref 0.2–1.0)
pH, UA: 6 (ref 5.0–7.5)

## 2018-06-09 DIAGNOSIS — G47419 Narcolepsy without cataplexy: Secondary | ICD-10-CM | POA: Diagnosis not present

## 2018-07-21 ENCOUNTER — Encounter: Payer: Self-pay | Admitting: Family Medicine

## 2018-09-03 ENCOUNTER — Encounter: Payer: Self-pay | Admitting: Family Medicine

## 2018-09-04 IMAGING — CR DG SHOULDER 2+V*L*
3 series · 3 of 3 positions shown · non-contrast
Comparison: None.

CLINICAL DATA: Left shoulder pain after lifting and moving objects
on [REDACTED].

EXAM:
LEFT SHOULDER - 2+ VIEW

[shoulder grashey]
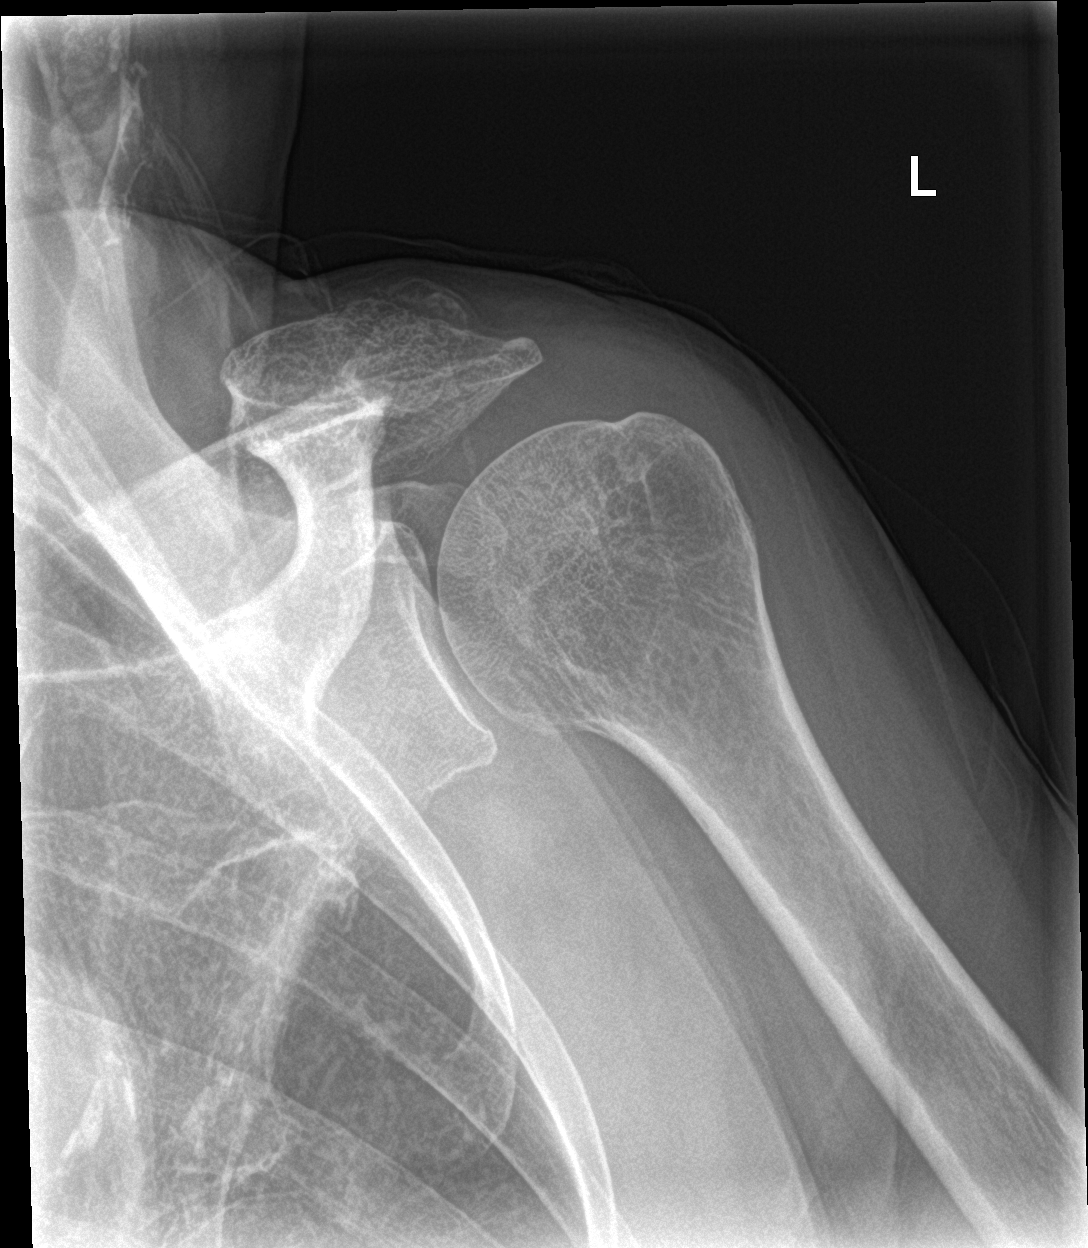

[shoulder y view]
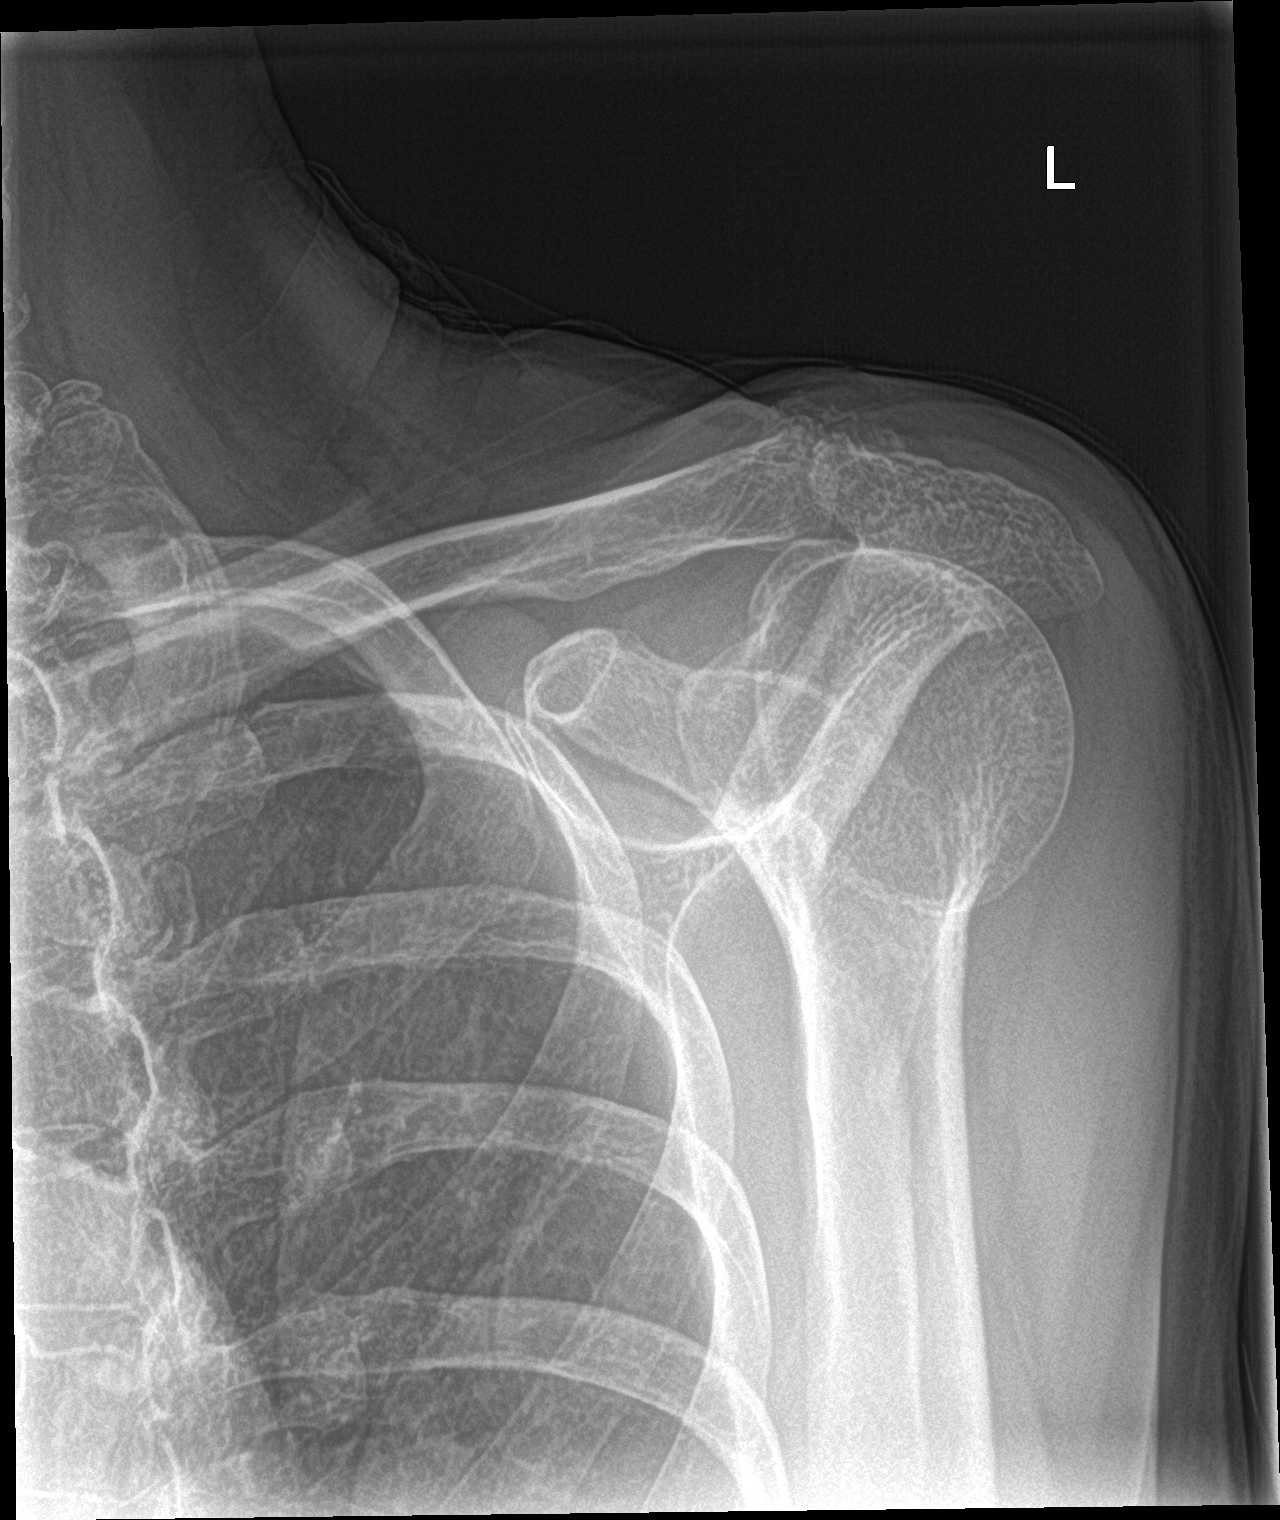

[shoulder axillary]
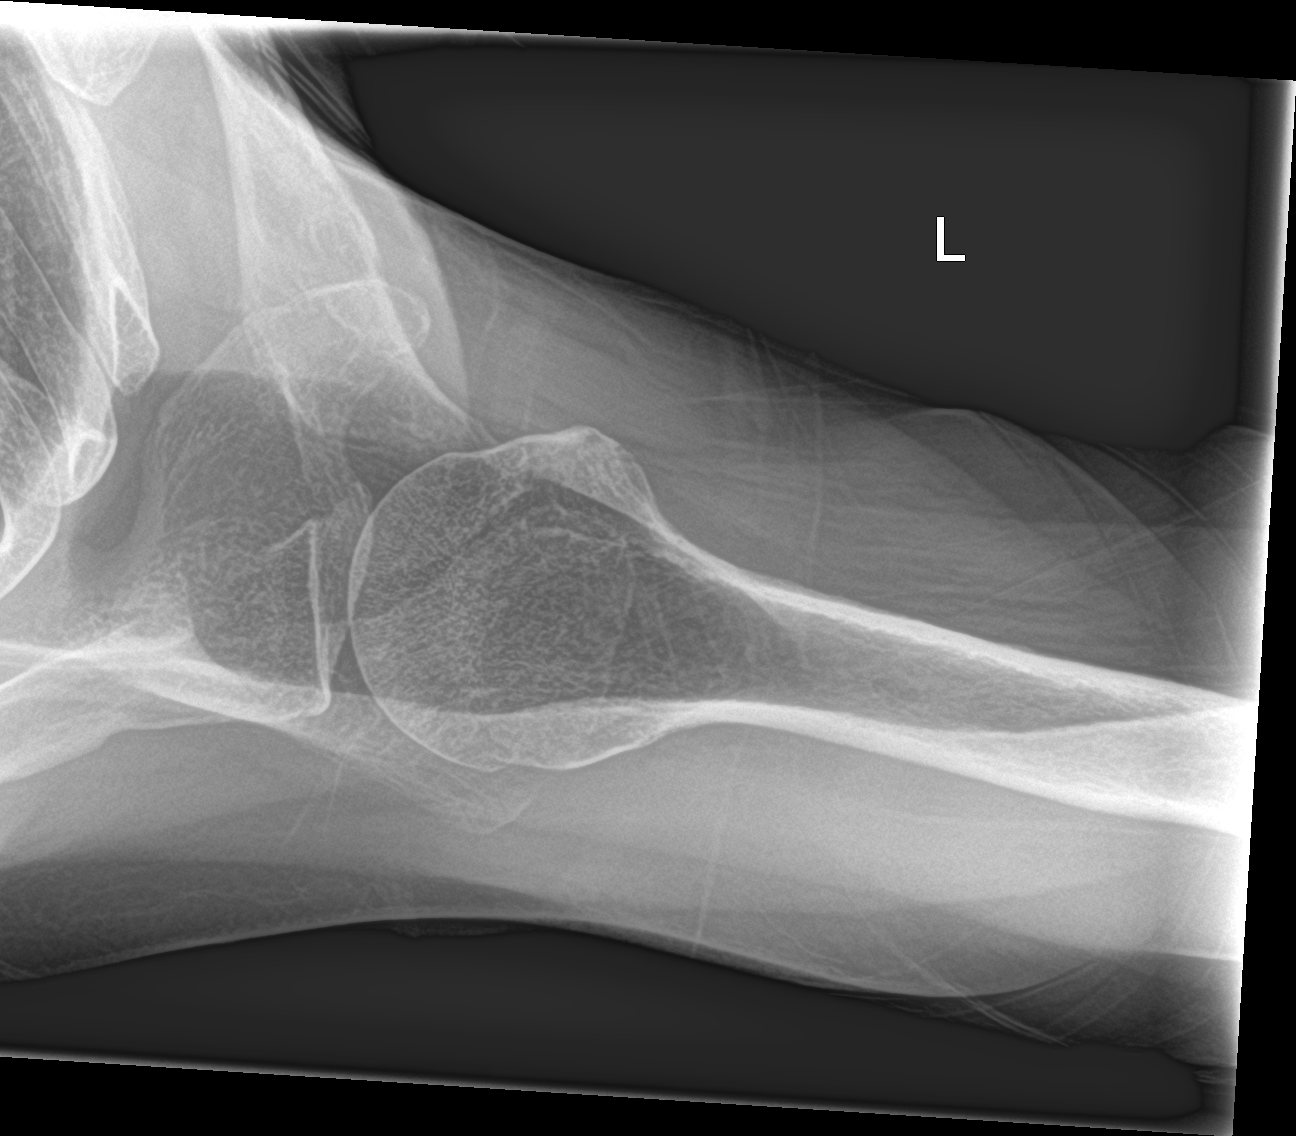

[3 of 3 positions shown; findings below may reference images not displayed]

FINDINGS: There is no evidence of fracture or dislocation. There is no
evidence of arthropathy or other focal bone abnormality. Soft
tissues are unremarkable.
IMPRESSION: Negative.

## 2018-10-06 ENCOUNTER — Inpatient Hospital Stay
Admission: EM | Admit: 2018-10-06 | Discharge: 2018-10-07 | DRG: 193 | Disposition: A | Discharge status: Home / Self Care | Payer: Medicare Other | Attending: Specialist | Admitting: Specialist

## 2018-10-06 ENCOUNTER — Emergency Department: Payer: Medicare Other

## 2018-10-06 ENCOUNTER — Other Ambulatory Visit: Payer: Self-pay

## 2018-10-06 ENCOUNTER — Encounter: Payer: Self-pay | Admitting: Emergency Medicine

## 2018-10-06 DIAGNOSIS — F988 Other specified behavioral and emotional disorders with onset usually occurring in childhood and adolescence: Secondary | ICD-10-CM | POA: Diagnosis not present

## 2018-10-06 DIAGNOSIS — Z9181 History of falling: Secondary | ICD-10-CM

## 2018-10-06 DIAGNOSIS — K219 Gastro-esophageal reflux disease without esophagitis: Secondary | ICD-10-CM | POA: Diagnosis present

## 2018-10-06 DIAGNOSIS — R0902 Hypoxemia: Secondary | ICD-10-CM | POA: Diagnosis not present

## 2018-10-06 DIAGNOSIS — G47419 Narcolepsy without cataplexy: Secondary | ICD-10-CM | POA: Diagnosis not present

## 2018-10-06 DIAGNOSIS — Z836 Family history of other diseases of the respiratory system: Secondary | ICD-10-CM

## 2018-10-06 DIAGNOSIS — F329 Major depressive disorder, single episode, unspecified: Secondary | ICD-10-CM | POA: Diagnosis not present

## 2018-10-06 DIAGNOSIS — J189 Pneumonia, unspecified organism: Principal | ICD-10-CM

## 2018-10-06 DIAGNOSIS — R51 Headache: Secondary | ICD-10-CM | POA: Diagnosis not present

## 2018-10-06 DIAGNOSIS — G8929 Other chronic pain: Secondary | ICD-10-CM | POA: Diagnosis present

## 2018-10-06 DIAGNOSIS — Z79899 Other long term (current) drug therapy: Secondary | ICD-10-CM

## 2018-10-06 DIAGNOSIS — J9601 Acute respiratory failure with hypoxia: Secondary | ICD-10-CM

## 2018-10-06 DIAGNOSIS — J441 Chronic obstructive pulmonary disease with (acute) exacerbation: Secondary | ICD-10-CM | POA: Diagnosis not present

## 2018-10-06 DIAGNOSIS — F410 Panic disorder [episodic paroxysmal anxiety] without agoraphobia: Secondary | ICD-10-CM | POA: Diagnosis not present

## 2018-10-06 DIAGNOSIS — M549 Dorsalgia, unspecified: Secondary | ICD-10-CM | POA: Diagnosis present

## 2018-10-06 DIAGNOSIS — Z8349 Family history of other endocrine, nutritional and metabolic diseases: Secondary | ICD-10-CM

## 2018-10-06 DIAGNOSIS — F17210 Nicotine dependence, cigarettes, uncomplicated: Secondary | ICD-10-CM | POA: Diagnosis not present

## 2018-10-06 DIAGNOSIS — Z809 Family history of malignant neoplasm, unspecified: Secondary | ICD-10-CM

## 2018-10-06 DIAGNOSIS — F419 Anxiety disorder, unspecified: Secondary | ICD-10-CM | POA: Diagnosis present

## 2018-10-06 DIAGNOSIS — J440 Chronic obstructive pulmonary disease with acute lower respiratory infection: Secondary | ICD-10-CM | POA: Diagnosis not present

## 2018-10-06 DIAGNOSIS — R531 Weakness: Secondary | ICD-10-CM

## 2018-10-06 DIAGNOSIS — Z8249 Family history of ischemic heart disease and other diseases of the circulatory system: Secondary | ICD-10-CM

## 2018-10-06 DIAGNOSIS — R05 Cough: Secondary | ICD-10-CM | POA: Diagnosis not present

## 2018-10-06 DIAGNOSIS — R0602 Shortness of breath: Secondary | ICD-10-CM | POA: Diagnosis not present

## 2018-10-06 DIAGNOSIS — Z20828 Contact with and (suspected) exposure to other viral communicable diseases: Secondary | ICD-10-CM | POA: Diagnosis present

## 2018-10-06 DIAGNOSIS — F909 Attention-deficit hyperactivity disorder, unspecified type: Secondary | ICD-10-CM | POA: Diagnosis present

## 2018-10-06 HISTORY — DX: Acute respiratory failure with hypoxia: J96.01

## 2018-10-06 HISTORY — DX: Pneumonia, unspecified organism: J18.9

## 2018-10-06 LAB — BASIC METABOLIC PANEL
Anion gap: 11 (ref 5–15)
BUN: 19 mg/dL (ref 8–23)
CO2: 25 mmol/L (ref 22–32)
Calcium: 9 mg/dL (ref 8.9–10.3)
Chloride: 98 mmol/L (ref 98–111)
Creatinine, Ser: 0.73 mg/dL (ref 0.44–1.00)
GFR calc Af Amer: >60 mL/min (ref >60)
GFR calc non Af Amer: >60 mL/min (ref >60)
Glucose, Bld: 117 mg/dL — ABNORMAL HIGH (ref 70–99)
Potassium: 3.5 mmol/L (ref 3.5–5.1)
Sodium: 134 mmol/L — ABNORMAL LOW (ref 135–145)

## 2018-10-06 LAB — CBC WITH DIFFERENTIAL/PLATELET
Abs Immature Granulocytes: 0.05 10*3/uL (ref 0.00–0.07)
Basophils Absolute: 0 10*3/uL (ref 0.0–0.1)
Basophils Relative: 0 %
Eosinophils Absolute: 0 10*3/uL (ref 0.0–0.5)
Eosinophils Relative: 0 %
HCT: 38.6 % (ref 36.0–46.0)
Hemoglobin: 12.8 g/dL (ref 12.0–15.0)
Immature Granulocytes: 1 %
Lymphocytes Relative: 13 %
Lymphs Abs: 1.3 10*3/uL (ref 0.7–4.0)
MCH: 31 pg (ref 26.0–34.0)
MCHC: 33.2 g/dL (ref 30.0–36.0)
MCV: 93.5 fL (ref 80.0–100.0)
Monocytes Absolute: 1.3 10*3/uL — ABNORMAL HIGH (ref 0.1–1.0)
Monocytes Relative: 14 %
Neutro Abs: 7.1 10*3/uL (ref 1.7–7.7)
Neutrophils Relative %: 72 %
Platelets: 299 10*3/uL (ref 150–400)
RBC: 4.13 MIL/uL (ref 3.87–5.11)
RDW: 13.4 % (ref 11.5–15.5)
WBC: 9.9 10*3/uL (ref 4.0–10.5)
nRBC: 0 % (ref 0.0–0.2)

## 2018-10-06 LAB — TROPONIN I (HIGH SENSITIVITY)
Troponin I (High Sensitivity): 8 ng/L (ref <18)
Troponin I (High Sensitivity): 9 ng/L (ref <18)

## 2018-10-06 LAB — PROCALCITONIN: Procalcitonin: 0.32 ng/mL

## 2018-10-06 LAB — BRAIN NATRIURETIC PEPTIDE: B Natriuretic Peptide: 78 pg/mL (ref 0.0–100.0)

## 2018-10-06 LAB — SARS CORONAVIRUS 2 BY RT PCR (HOSPITAL ORDER, PERFORMED IN ~~LOC~~ HOSPITAL LAB): SARS Coronavirus 2: NEGATIVE

## 2018-10-06 MED ORDER — SODIUM CHLORIDE 0.9 % IV SOLN
1.0000 g | Freq: Once | INTRAVENOUS | Status: AC
  Administered 2018-10-06: 1 g via INTRAVENOUS
  Filled 2018-10-06: qty 10

## 2018-10-06 MED ORDER — ALBUTEROL SULFATE HFA 108 (90 BASE) MCG/ACT IN AERS
2.0000 | INHALATION_SPRAY | Freq: Once | RESPIRATORY_TRACT | Status: AC
  Administered 2018-10-06: 2 via RESPIRATORY_TRACT
  Filled 2018-10-06: qty 6.7

## 2018-10-06 MED ORDER — IPRATROPIUM-ALBUTEROL 0.5-2.5 (3) MG/3ML IN SOLN
3.0000 mL | Freq: Four times a day (QID) | RESPIRATORY_TRACT | Status: DC
  Administered 2018-10-07 (×2): 3 mL via RESPIRATORY_TRACT
  Filled 2018-10-06 (×3): qty 3

## 2018-10-06 MED ORDER — SODIUM CHLORIDE 0.9 % IV SOLN
1.0000 g | INTRAVENOUS | Status: DC
  Filled 2018-10-06: qty 10

## 2018-10-06 MED ORDER — GUAIFENESIN-DM 100-10 MG/5ML PO SYRP: 5.0000 mL | ORAL_SOLUTION | ORAL | Status: DC | PRN

## 2018-10-06 MED ORDER — ALPRAZOLAM 0.5 MG PO TABS
0.5000 mg | ORAL_TABLET | Freq: Two times a day (BID) | ORAL | Status: DC
  Administered 2018-10-06 – 2018-10-07 (×2): 0.5 mg via ORAL
  Filled 2018-10-06 (×2): qty 1

## 2018-10-06 MED ORDER — PREDNISONE 20 MG PO TABS
60.0000 mg | ORAL_TABLET | Freq: Once | ORAL | Status: AC
  Administered 2018-10-06: 60 mg via ORAL
  Filled 2018-10-06: qty 3

## 2018-10-06 MED ORDER — ACETAMINOPHEN 325 MG PO TABS: 650.0000 mg | ORAL_TABLET | Freq: Four times a day (QID) | ORAL | Status: DC | PRN

## 2018-10-06 MED ORDER — HEPARIN SODIUM (PORCINE) 5000 UNIT/ML IJ SOLN
5000.0000 [IU] | Freq: Three times a day (TID) | INTRAMUSCULAR | Status: DC
  Administered 2018-10-06 – 2018-10-07 (×2): 5000 [IU] via SUBCUTANEOUS
  Filled 2018-10-06 (×2): qty 1

## 2018-10-06 MED ORDER — IPRATROPIUM-ALBUTEROL 0.5-2.5 (3) MG/3ML IN SOLN: 3.0000 mL | RESPIRATORY_TRACT | Status: DC | PRN

## 2018-10-06 MED ORDER — SODIUM CHLORIDE 0.9 % IV SOLN
500.0000 mg | Freq: Once | INTRAVENOUS | Status: AC
  Administered 2018-10-06: 500 mg via INTRAVENOUS
  Filled 2018-10-06: qty 500

## 2018-10-06 MED ORDER — DEXTROSE 5 % IV SOLN
250.0000 mg | INTRAVENOUS | Status: DC
  Filled 2018-10-06: qty 250

## 2018-10-06 MED ORDER — DOCUSATE SODIUM 100 MG PO CAPS: 100.0000 mg | ORAL_CAPSULE | Freq: Two times a day (BID) | ORAL | Status: DC | PRN

## 2018-10-06 MED ORDER — METHYLPHENIDATE HCL 10 MG PO TABS
20.0000 mg | ORAL_TABLET | Freq: Four times a day (QID) | ORAL | Status: DC
  Administered 2018-10-07 (×3): 20 mg via ORAL
  Filled 2018-10-06 (×3): qty 2

## 2018-10-06 NOTE — ED Notes (Signed)
Pt ambulated with pulse ox. O2 levels remained within normal limits. Pt very unsteady on feet, poor attention and concentration. Pt requesting something for headache/sinus. MD made aware.

## 2018-10-06 NOTE — Progress Notes (Signed)
Family Meeting Note  Advance Directive:yes  Today a meeting took place with the Patient.   The following clinical team members were present during this meeting:MD  The following were discussed:Patient's diagnosis: Acute respiratory failure with hypoxia, pneumonia, active smoker, Patient's progosis: Unable to determine and Goals for treatment: Full Code  Additional follow-up to be provided: PMD  Time spent during discussion:20 minutes  Vaughan Basta, MD

## 2018-10-06 NOTE — ED Notes (Signed)
ED TO INPATIENT HANDOFF REPORT  ED Nurse Name and Phone #:  Reuel BoomDaniel [phone]  S Name/Age/Gender Amelia JoNancy W Delsignore 73 y.o. female Room/Bed: ED18A/ED18A  Code Status   Code Status: Prior  Home/SNF/Other Home Patient oriented to: self, place, time and situation Is this baseline? Yes   Triage Complete: Triage complete  Chief Complaint Headache  EMS  Triage Note Pt arrived by EMS who states that she was at her daughters house and she has had SOB, headache, weakness, and a sore throat since Saturday night.    Allergies No Known Allergies  Level of Care/Admitting Diagnosis ED Disposition    ED Disposition Condition Comment   Admit  Hospital Area[facility] [[id number]]  Level of Care: Med-Surg [16]  Covid Evaluation: Confirmed COVID Negative  Diagnosis: Community acquired pneumonia [[id number]][[id number]]  Admitting Physician: Altamese DillingVACHHANI, VAIBHAVKUMAR [phone][[id number]]  Attending Physician: Altamese DillingVACHHANI, VAIBHAVKUMAR [phone][[id number]]  Estimated length of stay: past midnight tomorrow  Certification:: I certify this patient will need inpatient services for at least 2 midnights  PT Class (Do Not Modify): Inpatient [101]  PT Acc Code (Do Not Modify): Private [1]       B Medical/Surgery History Past Medical History:  Diagnosis Date  . ADD (attention deficit disorder)   . Anxiety   . Hemorrhoid   . Hypokalemia   . Narcolepsy   . Panic attacks   . Pneumonia    Past Surgical History:  Procedure Laterality Date  . TUBAL LIGATION       A IV Location/Drains/Wounds Patient Lines/Drains/Airways Status   Active Line/Drains/Airways    Name:   Placement date:   Placement time:   Site:   Days:   Peripheral IV 10/06/18 Right Antecubital   10/06/18    1623    Antecubital   less than 1          Intake/Output Last 24 hours  Intake/Output Summary (Last 24 hours) at 10/06/2018 2113 Last data filed at 10/06/2018 2040 Gross per 24 hour  Intake 100 ml  Output -  Net 100 ml     Labs/Imaging Results for orders placed or performed during the hospital encounter of 10/06/18 (from the past 48 hour(s))  CBC with Differential     Status: Abnormal   Collection Time: 10/06/18  4:14 PM  Result Value Ref Range   WBC 9.9 4.0 - 10.5 K/uL   RBC 4.13 3.87 - 5.11 MIL/uL   Hemoglobin 12.8 12.0 - 15.0 g/dL   HCT 57.838.6 46.936.0 - 62.946.0 %   MCV 93.5 80.0 - 100.0 fL   MCH 31.0 26.0 - 34.0 pg   MCHC 33.2 30.0 - 36.0 g/dL   RDW 52.813.4 41.311.5 - 24.415.5 %   Platelets 299 150 - 400 K/uL   nRBC 0.0 0.0 - 0.2 %   Neutrophils Relative % 72 %   Neutro Abs 7.1 1.7 - 7.7 K/uL   Lymphocytes Relative 13 %   Lymphs Abs 1.3 0.7 - 4.0 K/uL   Monocytes Relative 14 %   Monocytes Absolute 1.3 (H) 0.1 - 1.0 K/uL   Eosinophils Relative 0 %   Eosinophils Absolute 0.0 0.0 - 0.5 K/uL   Basophils Relative 0 %   Basophils Absolute 0.0 0.0 - 0.1 K/uL   Immature Granulocytes 1 %   Abs Immature Granulocytes 0.05 0.00 - 0.07 K/uL    Comment: Performed at [facility], [address] [place], [place] [address]  Basic metabolic panel     Status: Abnormal  Collection Time: 10/06/18  4:14 PM  Result Value Ref Range   Sodium 134 (L) 135 - 145 mmol/L   Potassium 3.5 3.5 - 5.1 mmol/L   Chloride 98 98 - 111 mmol/L   CO2 25 22 - 32 mmol/L   Glucose, Bld 117 (H) 70 - 99 mg/dL   BUN 19 8 - 23 mg/dL   Creatinine, Ser 1.610.73 0.44 - 1.00 mg/dL   Calcium 9.0 8.9 - 09.610.3 mg/dL   GFR calc non Af Amer >60 >60 mL/min   GFR calc Af Amer >60 >60 mL/min   Anion gap 11 5 - 15    Comment: Performed at [facility], [address] [place], [place] [address]  Troponin I (High Sensitivity)     Status: None   Collection Time: 10/06/18  4:14 PM  Result Value Ref Range   Troponin I (High Sensitivity) 8 <18 ng/L    Comment: (NOTE) Elevated high sensitivity troponin I (hsTnI) values and significant  changes across serial measurements may suggest ACS but many other  chronic and acute conditions are known to  elevate hsTnI results.  Refer to the "Links" section for chest pain algorithms and additional  guidance. Performed at [facility], [address] [place], [place] [address]   Brain natriuretic peptide     Status: None   Collection Time: 10/06/18  4:14 PM  Result Value Ref Range   B Natriuretic Peptide 78.0 0.0 - 100.0 pg/mL    Comment: Performed at [facility], [address] [place], [place] [address]  Procalcitonin - Baseline     Status: None   Collection Time: 10/06/18  4:14 PM  Result Value Ref Range   Procalcitonin 0.32 ng/mL    Comment:        Interpretation: PCT (Procalcitonin) <= 0.5 ng/mL: Systemic infection (sepsis) is not likely. Local bacterial infection is possible. (NOTE)       Sepsis PCT Algorithm           Lower Respiratory Tract                                      Infection PCT Algorithm    ----------------------------     ----------------------------         PCT < 0.25 ng/mL                PCT < 0.10 ng/mL         Strongly encourage             Strongly discourage   discontinuation of antibiotics    initiation of antibiotics    ----------------------------     -----------------------------       PCT 0.25 - 0.50 ng/mL            PCT 0.10 - 0.25 ng/mL               OR       >80% decrease in PCT            Discourage initiation of                                            antibiotics      Encourage discontinuation           of antibiotics    ----------------------------     -----------------------------  PCT >= 0.50 ng/mL              PCT 0.26 - 0.50 ng/mL               AND        <80% decrease in PCT             Encourage initiation of                                             antibiotics       Encourage continuation           of antibiotics    ----------------------------     -----------------------------        PCT >= 0.50 ng/mL                  PCT > 0.50 ng/mL               AND         increase in PCT                   Strongly encourage                                      initiation of antibiotics    Strongly encourage escalation           of antibiotics                                     -----------------------------                                           PCT <= 0.25 ng/mL                                                 OR                                        > 80% decrease in PCT                                     Discontinue / Do not initiate                                             antibiotics Performed at [facility], [address] [place], [place] [address]   SARS Coronavirus 2 [facility] order, Performed in [facility] hospital lab) Nasopharyngeal Nasopharyngeal Swab     Status: None   Collection Time: 10/06/18  4:31 PM   Specimen: Nasopharyngeal Swab  Result Value Ref Range   SARS Coronavirus 2 NEGATIVE NEGATIVE    Comment: (NOTE) If  result is NEGATIVE SARS-CoV-2 target nucleic acids are NOT DETECTED. The SARS-CoV-2 RNA is generally detectable in upper and lower  respiratory specimens during the acute phase of infection. The lowest  concentration of SARS-CoV-2 viral copies this assay can detect is 250  copies / mL. A negative result does not preclude SARS-CoV-2 infection  and should not be used as the sole basis for treatment or other  patient management decisions.  A negative result may occur with  improper specimen collection / handling, submission of specimen other  than nasopharyngeal swab, presence of viral mutation(s) within the  areas targeted by this assay, and inadequate number of viral copies  (<250 copies / mL). A negative result must be combined with clinical  observations, patient history, and epidemiological information. If result is POSITIVE SARS-CoV-2 target nucleic acids are DETECTED. The SARS-CoV-2 RNA is generally detectable in upper and lower  respiratory specimens dur ing the acute phase of infection.  Positive  results are indicative of  active infection with SARS-CoV-2.  Clinical  correlation with patient history and other diagnostic information is  necessary to determine patient infection status.  Positive results do  not rule out bacterial infection or co-infection with other viruses. If result is PRESUMPTIVE POSTIVE SARS-CoV-2 nucleic acids MAY BE PRESENT.   A presumptive positive result was obtained on the submitted specimen  and confirmed on repeat testing.  While 2019 novel coronavirus  (SARS-CoV-2) nucleic acids may be present in the submitted sample  additional confirmatory testing may be necessary for epidemiological  and / or clinical management purposes  to differentiate between  SARS-CoV-2 and other Sarbecovirus currently known to infect humans.  If clinically indicated additional testing with an alternate test  methodology [phone]) is advised. The SARS-CoV-2 RNA is generally  detectable in upper and lower respiratory sp ecimens during the acute  phase of infection. The expected result is Negative. Fact Sheet for Patients:  StrictlyIdeas.no Fact Sheet for Healthcare Providers: BankingDealers.co.za This test is not yet approved or cleared by the [place] FDA and has been authorized for detection and/or diagnosis of SARS-CoV-2 by FDA under an Emergency Use Authorization (EUA).  This EUA will remain in effect (meaning this test can be used) for the duration of the COVID-19 declaration under Section 564(b)(1) of the Act, 21 U.S.C. section 360bbb-3(b)(1), unless the authorization is terminated or revoked sooner. Performed at [facility], [address] [place], [place] [address]    Dg Chest Portable 1 View  Result Date: 10/06/2018 CLINICAL DATA:  Shortness of breath EXAM: PORTABLE CHEST 1 VIEW COMPARISON:  February 20, 2016 FINDINGS: There is patchy airspace opacity seen predominantly at the right lower lung and peripheral right upper lung.  There is superimposed chronic interstitial thickening at the bilateral lower lung bases as on prior exam. The cardiomediastinal silhouette is unchanged. There is atherosclerotic calcification the aortic knob with a tortuous descending aorta. No acute osseous abnormality. IMPRESSION: 1. Patchy airspace opacities within the right lung which are nonspecific, but concerning for atypical infection, which includes viral pneumonia. 2. Superimposed chronic interstitial lung changes at both lung bases. Electronically Signed   By: Prudencio Pair M.D.   On: 10/06/2018 16:26    Pending Labs Unresulted Labs (From admission, onward)    Start     Ordered   10/07/18 0500  Procalcitonin  Daily,   STAT     10/06/18 1857   10/06/18 1554  Urinalysis, Complete w Microscopic  ONCE - STAT,   STAT  10/06/18 1555   Signed and Held  Basic metabolic panel  Tomorrow morning,   R     Signed and Held   Signed and Held  CBC  Tomorrow morning,   R     Signed and Held   Signed and Held  CBC  (heparin)  Once,   R    Comments: Baseline for heparin therapy IF NOT ALREADY DRAWN.  Notify MD if PLT < 100 K.    Signed and Held   Signed and Held  Creatinine, serum  (heparin)  Once,   R    Comments: Baseline for heparin therapy IF NOT ALREADY DRAWN.    Signed and Held          Vitals/Pain Today's Vitals   10/06/18 1846 10/06/18 1850 10/06/18 2000 10/06/18 2100  BP: 127/64  120/89 (!) 107/59  Pulse: (!) 115 (!) 107 (!) 107 95  Resp:  19 (!) 24 (!) 23  Temp:      SpO2: 98% 95% 98% 100%  Weight:      Height:      PainSc:        Isolation Precautions No active isolations  Medications Medications  azithromycin (ZITHROMAX) 500 mg in sodium chloride 0.9 % 250 mL IVPB (500 mg Intravenous New Bag/Given 10/06/18 2045)  cefTRIAXone (ROCEPHIN) 1 g in sodium chloride 0.9 % 100 mL IVPB (has no administration in time range)  azithromycin (ZITHROMAX) 250 mg in dextrose 5 % 125 mL IVPB (has no administration in time range)   ipratropium-albuterol (DUONEB) 0.5-2.5 (3) MG/3ML nebulizer solution 3 mL (has no administration in time range)  guaiFENesin-dextromethorphan (ROBITUSSIN DM) 100-10 MG/5ML syrup 5 mL (has no administration in time range)  acetaminophen (TYLENOL) tablet 650 mg (has no administration in time range)  albuterol (VENTOLIN HFA) 108 (90 Base) MCG/ACT inhaler 2 puff (2 puffs Inhalation Given 10/06/18 1755)  predniSONE (DELTASONE) tablet 60 mg (60 mg Oral Given 10/06/18 1757)  cefTRIAXone (ROCEPHIN) 1 g in sodium chloride 0.9 % 100 mL IVPB (0 g Intravenous Stopped 10/06/18 2040)    Mobility walks Low fall risk   Focused Assessments Respiratory/Cough   R Recommendations: See Admitting Provider Note  Report given to:   Additional Notes:  Patient has narcolepsy and some short term memory impairment.

## 2018-10-06 NOTE — ED Provider Notes (Signed)
[facility] Emergency Department Provider Note   ____________________________________________   First MD Initiated Contact with Patient 10/06/18 1550     (approximate)  I have reviewed the triage vital signs and the nursing notes.   HISTORY  Chief Complaint Cough, Sore Throat, and Weakness    HPI Sandra Stout is a 73 y.o. female with past medical history of anxiety and pneumonia presents to the ED complaining of generalized weakness and muscle aches.  Patient reports that she started feeling bad 2 days ago with chills, headache, sore throat, and myalgias.  She states she has a cough at baseline, but this is become more frequent and productive.  She has also felt somewhat out of breath and generally weak, but denies any chest pain.  She has not noticed any pain or swelling in her legs.  She does admit to smoking about 1 pack/day of cigarettes, denies alcohol or drug abuse.  She reports feeling weak enough earlier in the day today that she stumbled and fell to the ground, did not hit her head or lose consciousness.        Past Medical History:  Diagnosis Date  . ADD (attention deficit disorder)   . Anxiety   . Hemorrhoid   . Hypokalemia   . Narcolepsy   . Panic attacks   . Pneumonia     Patient Active Problem List   Diagnosis Date Noted  . Community acquired pneumonia 10/06/2018  . Acute respiratory failure with hypoxia (HCC) 10/06/2018  . Depression, recurrent (HCC) 02/03/2018  . Chronic back pain 08/17/2014  . GERD (gastroesophageal reflux disease) 08/17/2014  . Chronic fatigue 08/17/2014  . ADD (attention deficit disorder)   . Anxiety   . 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 TUBAL LIGATION      Prior to Admission medications   Medication Sig Start Date End Date Taking? Authorizing Provider  ALPRAZolam Prudy Feeler(XANAX) 0.5 MG tablet Take 0.5 mg by mouth 2 (two) times daily.   06/20/14  Yes [provider]  methylphenidate (RITALIN) 20 MG tablet Take 20 mg by mouth 4 (four) times daily.   Yes [provider]    Allergies Patient has no known allergies.  Family History  Problem Relation Age of Onset  . Lung disease Mother   . Thyroid disease Sister   . Lung disease Sister   . Cancer Sister        Bladder  . Heart disease Father   . Breast cancer Neg Hx     Social History Social History   Tobacco Use  . Smoking status: Current Every Day Smoker    Packs/day: 0.50    Types: Cigarettes  . Smokeless tobacco: Never Used  Substance Use Topics  . Alcohol use: No  . Drug use: No    Review of Systems  Constitutional: Positive for chills, negative for fever Eyes: No visual changes. ENT: Positive for sore throat Cardiovascular: Denies chest pain. Respiratory: Positive for shortness of breath and cough. Gastrointestinal: No abdominal pain.  No nausea, no vomiting.  No diarrhea.  No constipation. Genitourinary: Negative for dysuria. Musculoskeletal: Negative for back pain.  Positive for myalgias. Skin: Negative for rash. Neurological: Positive for headaches, negative for focal weakness or numbness.  ____________________________________________   PHYSICAL EXAM:  VITAL SIGNS: ED Triage Vitals  Enc Vitals Group     BP      Pulse      Resp  Temp      Temp src      SpO2      Weight      Height      Head Circumference      Peak Flow      Pain Score      Pain Loc      Pain Edu?      Excl. in [place]?     Constitutional: Alert and oriented. Eyes: Conjunctivae are normal. Head: Atraumatic. Nose: No congestion/rhinnorhea. Mouth/Throat: Mucous membranes are moist.  Oropharynx clear without erythema, exudates, or edema. Neck: Normal ROM Cardiovascular: Normal rate, regular rhythm. Grossly normal heart sounds. Respiratory: Normal respiratory effort.  No retractions.  Scattered wheezing. Gastrointestinal: Soft and nontender.  No distention. Genitourinary: deferred Musculoskeletal: No lower extremity tenderness nor edema. Neurologic:  Normal speech and language. No gross focal neurologic deficits are appreciated. Skin:  Skin is warm, dry and intact. No rash noted. Psychiatric: Mood and affect are normal. Speech and behavior are normal.  ____________________________________________   LABS (all labs ordered are listed, but only abnormal results are displayed)  Labs Reviewed  CBC WITH DIFFERENTIAL/PLATELET - Abnormal; Notable for the following components:      Result Value   Monocytes Absolute 1.3 (*)    All other components within normal limits  BASIC METABOLIC PANEL - Abnormal; Notable for the following components:   Sodium 134 (*)    Glucose, Bld 117 (*)    All other components within normal limits  SARS CORONAVIRUS 2 (HOSPITAL ORDER, [place] [facility])  BRAIN NATRIURETIC PEPTIDE  PROCALCITONIN  URINALYSIS, COMPLETE (UACMP) WITH MICROSCOPIC  PROCALCITONIN  TROPONIN I (HIGH SENSITIVITY)  TROPONIN I (HIGH SENSITIVITY)   ____________________________________________  EKG  ED ECG REPORT I, Blake Divine, the attending physician, personally viewed and interpreted this ECG.   Date: 10/06/2018  EKG Time: 16:13  Rate: 103  Rhythm: sinus tachycardia  Axis: Normal  Intervals:none  ST&T Change: None    PROCEDURES  Procedure(s) performed (including Critical Care):  Procedures   ____________________________________________   INITIAL IMPRESSION / ASSESSMENT AND PLAN / ED COURSE       73 year old female presenting with constellation of symptoms including sore throat, body aches, and shortness of breath over the past few days.  Oropharynx clear, doubt strep pharyngitis.  EKG without ischemic changes and troponin within normal limits, doubt ACS.  Symptoms appear most likely infectious in origin given body aches and malaise, she does have abnormal chest x-ray potentially  consistent with viral pneumonia, however coronavirus testing is negative.  Will cover for community-acquired pneumonia.  While patient does not have oxygen desaturations, she is very unsteady on her feet when ambulating and not likely to do well managing her symptoms at home.  Case discussed with hospitalist, who accepts patient for admission.      ____________________________________________   FINAL CLINICAL IMPRESSION(S) / ED DIAGNOSES  Final diagnoses:  Community acquired pneumonia of right lung, unspecified part of lung  Generalized weakness  COPD exacerbation [facility])     ED Discharge Orders    None       Note:  This document was prepared using Dragon voice recognition software and may include unintentional dictation errors.   Blake Divine, MD 10/06/18 2157

## 2018-10-06 NOTE — ED Triage Notes (Signed)
Pt arrived by EMS who states that she was at her daughters house and she has had SOB, headache, weakness, and a sore throat since Saturday night.

## 2018-10-06 NOTE — H&P (Signed)
[employer] - [place] [facility]   PATIENT NAME: Sandra Stout    MR#:  [id number]  DATE OF BIRTH:  11-20-1945  DATE OF ADMISSION:  10/06/2018  PRIMARY CARE PHYSICIAN: Dorcas CarrowJohnson, Megan P, DO   REQUESTING/REFERRING PHYSICIAN: Larinda ButteryJessup  CHIEF COMPLAINT:   Chief Complaint  Patient presents with  . Cough  . Sore Throat  . Weakness    HISTORY OF PRESENT ILLNESS: Sandra Stout  is a 73 y.o. female with a known history of attention deficit disorder, anxiety, hypokalemia, narcolepsy, panic attacks-came to hospital with generalized weakness and worsening shortness of breath with chills for last few days and getting worse. She was noted to have pneumonia on chest x-ray.  She was found hypoxic, tachypneic, tachycardic in ER.  Her COVID-19 test was negative. Given to hospitalist team for treatment of pneumonia.  PAST MEDICAL HISTORY:   Past Medical History:  Diagnosis Date  . ADD (attention deficit disorder)   . Anxiety   . Hemorrhoid   . Hypokalemia   . Narcolepsy   . Panic attacks   . Pneumonia     PAST SURGICAL HISTORY:  Past Surgical History:  Procedure Laterality Date  . TUBAL LIGATION      SOCIAL HISTORY:  Social History   Tobacco Use  . Smoking status: Current Every Day Smoker    Packs/day: 0.50    Types: Cigarettes  . Smokeless tobacco: Never Used  Substance Use Topics  . Alcohol use: No    FAMILY HISTORY:  Family History  Problem Relation Age of Onset  . Lung disease Mother   . Thyroid disease Sister   . Lung disease Sister   . Cancer Sister        Bladder  . Heart disease Father   . Breast cancer Neg Hx     DRUG ALLERGIES: No Known Allergies  REVIEW OF SYSTEMS:   CONSTITUTIONAL: No fever,have fatigue or weakness.  EYES: No blurred or double vision.  EARS, NOSE, AND THROAT: No tinnitus or ear pain.  RESPIRATORY: have cough, shortness of breath, no wheezing or hemoptysis.  CARDIOVASCULAR: No chest pain, orthopnea, edema.   GASTROINTESTINAL: No nausea, vomiting, diarrhea or abdominal pain.  GENITOURINARY: No dysuria, hematuria.  ENDOCRINE: No polyuria, nocturia,  HEMATOLOGY: No anemia, easy bruising or bleeding SKIN: No rash or lesion. MUSCULOSKELETAL: No joint pain or arthritis.   NEUROLOGIC: No tingling, numbness, weakness.  PSYCHIATRY: No anxiety or depression.   MEDICATIONS AT HOME:  Prior to Admission medications   Medication Sig Start Date End Date Taking? Authorizing Provider  ALPRAZolam Prudy Feeler(XANAX) 0.5 MG tablet Take 0.5 mg by mouth 2 (two) times daily.  06/20/14  Yes [provider]  methylphenidate (RITALIN) 20 MG tablet Take 20 mg by mouth 4 (four) times daily.   Yes [provider]      PHYSICAL EXAMINATION:   VITAL SIGNS: Blood pressure 120/89, pulse (!) 107, temperature 98.4 F (36.9 C), resp. rate (!) 24, height 5\' 2"  (1.575 m), weight 53.5 kg, SpO2 98 %.  GENERAL:  73 y.o.-year-old patient lying in the bed with no acute distress.  EYES: Pupils equal, round, reactive to light and accommodation. No scleral icterus. Extraocular muscles intact.  HEENT: Head atraumatic, normocephalic. Oropharynx and nasopharynx clear.  NECK:  Supple, no jugular venous distention. No thyroid enlargement, no tenderness.  LUNGS: Normal breath sounds bilaterally, no wheezing, some crepitation. No use of accessory muscles of respiration.  CARDIOVASCULAR: S1, S2 normal. No murmurs, rubs, or gallops.  ABDOMEN:  Soft, nontender, nondistended. Bowel sounds present. No organomegaly or mass.  EXTREMITIES: No pedal edema, cyanosis, or clubbing.  NEUROLOGIC: Cranial nerves II through XII are intact. Muscle strength 5/5 in all extremities. Sensation intact. Gait not checked.  PSYCHIATRIC: The patient is alert and oriented x 3.  SKIN: No obvious rash, lesion, or ulcer.   LABORATORY PANEL:   CBC Recent Labs  Lab 10/06/18 1614  WBC 9.9  HGB 12.8  HCT 38.6  PLT 299  MCV 93.5  MCH 31.0  MCHC 33.2   RDW 13.4  LYMPHSABS 1.3  MONOABS 1.3*  EOSABS 0.0  BASOSABS 0.0   ------------------------------------------------------------------------------------------------------------------  Chemistries  Recent Labs  Lab 10/06/18 1614  NA 134*  K 3.5  CL 98  CO2 25  GLUCOSE 117*  BUN 19  CREATININE 0.73  CALCIUM 9.0   ------------------------------------------------------------------------------------------------------------------ estimated creatinine clearance is 48.8 mL/min (by C-G formula based on SCr of 0.73 mg/dL). ------------------------------------------------------------------------------------------------------------------ No results for input(s): TSH, T4TOTAL, T3FREE, THYROIDAB in the last 72 hours.  Invalid input(s): FREET3   Coagulation profile No results for input(s): INR, PROTIME in the last 168 hours. ------------------------------------------------------------------------------------------------------------------- No results for input(s): DDIMER in the last 72 hours. -------------------------------------------------------------------------------------------------------------------  Cardiac Enzymes No results for input(s): CKMB, TROPONINI, MYOGLOBIN in the last 168 hours.  Invalid input(s): CK ------------------------------------------------------------------------------------------------------------------ Invalid input(s): POCBNP  ---------------------------------------------------------------------------------------------------------------  Urinalysis    Component Value Date/Time   COLORURINE STRAW (A) 02/20/2016 2127   APPEARANCEUR CD 02/03/2018 1557   LABSPEC 1.014 02/20/2016 2127   LABSPEC 1.017 02/03/2014 1130   PHURINE 6.0 02/20/2016 2127   GLUCOSEU Negative 02/03/2018 1557   GLUCOSEU Negative 02/03/2014 1130   HGBUR MODERATE (A) 02/20/2016 2127   BILIRUBINUR Negative 02/03/2018 1557   BILIRUBINUR Negative 02/03/2014 1130   KETONESUR 5 (A)  02/20/2016 2127   PROTEINUR Negative 02/03/2018 1557   PROTEINUR NEGATIVE 02/20/2016 2127   NITRITE NPOS 02/03/2018 1557   NITRITE NEGATIVE 02/20/2016 2127   LEUKOCYTESUR UR1 02/03/2018 1557   LEUKOCYTESUR Negative 02/03/2014 1130     RADIOLOGY: Dg Chest Portable 1 View  Result Date: 10/06/2018 CLINICAL DATA:  Shortness of breath EXAM: PORTABLE CHEST 1 VIEW COMPARISON:  February 20, 2016 FINDINGS: There is patchy airspace opacity seen predominantly at the right lower lung and peripheral right upper lung. There is superimposed chronic interstitial thickening at the bilateral lower lung bases as on prior exam. The cardiomediastinal silhouette is unchanged. There is atherosclerotic calcification the aortic knob with a tortuous descending aorta. No acute osseous abnormality. IMPRESSION: 1. Patchy airspace opacities within the right lung which are nonspecific, but concerning for atypical infection, which includes viral pneumonia. 2. Superimposed chronic interstitial lung changes at both lung bases. Electronically Signed   By: Jonna ClarkBindu  Avutu M.D.   On: 10/06/2018 16:26    EKG: Orders placed or performed during the hospital encounter of 10/06/18  . ED EKG  . ED EKG  . EKG 12-Lead  . EKG 12-Lead    IMPRESSION AND PLAN:  *Acute respiratory failure with hypoxia Secondary to community-acquired pneumonia  Currently supplemental oxygen, encourage incentive spirometer use and nebulizers as needed.  Try to taper oxygen as her pneumonia clears up.  *Community-acquired pneumonia Ceftriaxone and azithromycin. Follow-up procalcitonin. COVID-19 test is negative. Incentive spirometer.  *Active smoking Encouraged to quit smoking and offered nicotine patch, counseling provided for 4 minutes.  *Anxiety and ADD Continue home medications.    All the records are reviewed and case discussed with ED provider. Management plans discussed with the patient, family  and they are in agreement.  CODE  STATUS: full. Code Status History    Date Active Date Inactive Code Status Order ID Comments User Context   02/21/2016 0329 02/22/2016 1526 Full Code [id number]  Harvie Bridge, DO Inpatient   Advance Care Planning Activity       TOTAL TIME TAKING CARE OF THIS PATIENT: 50 minutes.    Vaughan Basta M.D on 10/06/2018   Between 7am to 6pm - Pager - [phone]  After 6pm go to [email or web address] - password [email or web address] [facility]  Office  [phone]  CC: Primary care physician; Valerie Roys, DO   Note: This dictation was prepared with Dragon dictation along with smaller phrase technology. Any transcriptional errors that result from this process are unintentional.

## 2018-10-07 DIAGNOSIS — G8929 Other chronic pain: Secondary | ICD-10-CM | POA: Diagnosis not present

## 2018-10-07 DIAGNOSIS — Z8349 Family history of other endocrine, nutritional and metabolic diseases: Secondary | ICD-10-CM | POA: Diagnosis not present

## 2018-10-07 DIAGNOSIS — R269 Unspecified abnormalities of gait and mobility: Secondary | ICD-10-CM | POA: Diagnosis not present

## 2018-10-07 DIAGNOSIS — J9601 Acute respiratory failure with hypoxia: Secondary | ICD-10-CM | POA: Diagnosis not present

## 2018-10-07 DIAGNOSIS — Z79899 Other long term (current) drug therapy: Secondary | ICD-10-CM | POA: Diagnosis not present

## 2018-10-07 DIAGNOSIS — M549 Dorsalgia, unspecified: Secondary | ICD-10-CM | POA: Diagnosis not present

## 2018-10-07 DIAGNOSIS — K219 Gastro-esophageal reflux disease without esophagitis: Secondary | ICD-10-CM | POA: Diagnosis not present

## 2018-10-07 DIAGNOSIS — R05 Cough: Secondary | ICD-10-CM | POA: Diagnosis not present

## 2018-10-07 DIAGNOSIS — Z9181 History of falling: Secondary | ICD-10-CM | POA: Diagnosis not present

## 2018-10-07 DIAGNOSIS — J440 Chronic obstructive pulmonary disease with acute lower respiratory infection: Secondary | ICD-10-CM | POA: Diagnosis not present

## 2018-10-07 DIAGNOSIS — J189 Pneumonia, unspecified organism: Secondary | ICD-10-CM | POA: Diagnosis not present

## 2018-10-07 DIAGNOSIS — G47419 Narcolepsy without cataplexy: Secondary | ICD-10-CM | POA: Diagnosis not present

## 2018-10-07 DIAGNOSIS — J441 Chronic obstructive pulmonary disease with (acute) exacerbation: Secondary | ICD-10-CM | POA: Diagnosis not present

## 2018-10-07 LAB — BASIC METABOLIC PANEL
Anion gap: 7 (ref 5–15)
BUN: 17 mg/dL (ref 8–23)
CO2: 25 mmol/L (ref 22–32)
Calcium: 9.1 mg/dL (ref 8.9–10.3)
Chloride: 104 mmol/L (ref 98–111)
Creatinine, Ser: 0.68 mg/dL (ref 0.44–1.00)
GFR calc Af Amer: >60 mL/min (ref >60)
GFR calc non Af Amer: >60 mL/min (ref >60)
Glucose, Bld: 204 mg/dL — ABNORMAL HIGH (ref 70–99)
Potassium: 3.4 mmol/L — ABNORMAL LOW (ref 3.5–5.1)
Sodium: 136 mmol/L (ref 135–145)

## 2018-10-07 LAB — CBC
HCT: 39.5 % (ref 36.0–46.0)
Hemoglobin: 13.2 g/dL (ref 12.0–15.0)
MCH: 31.4 pg (ref 26.0–34.0)
MCHC: 33.4 g/dL (ref 30.0–36.0)
MCV: 93.8 fL (ref 80.0–100.0)
Platelets: 295 10*3/uL (ref 150–400)
RBC: 4.21 MIL/uL (ref 3.87–5.11)
RDW: 13.2 % (ref 11.5–15.5)
WBC: 7.5 10*3/uL (ref 4.0–10.5)
nRBC: 0 % (ref 0.0–0.2)

## 2018-10-07 LAB — MAGNESIUM: Magnesium: 2.1 mg/dL (ref 1.7–2.4)

## 2018-10-07 LAB — PROCALCITONIN: Procalcitonin: 0.25 ng/mL

## 2018-10-07 MED ORDER — AZITHROMYCIN 250 MG PO TABS: 250.0000 mg | ORAL_TABLET | Freq: Every day | ORAL | Status: DC

## 2018-10-07 MED ORDER — ENOXAPARIN SODIUM 40 MG/0.4ML ~~LOC~~ SOLN: 40.0000 mg | SUBCUTANEOUS | Status: DC

## 2018-10-07 MED ORDER — LEVOFLOXACIN 500 MG PO TABS: 500.0000 mg | ORAL_TABLET | Freq: Every day | ORAL | 0 refills | Status: AC

## 2018-10-07 MED ORDER — POTASSIUM CHLORIDE CRYS ER 20 MEQ PO TBCR
40.0000 meq | EXTENDED_RELEASE_TABLET | Freq: Once | ORAL | Status: AC
  Administered 2018-10-07: 40 meq via ORAL
  Filled 2018-10-07: qty 2

## 2018-10-07 NOTE — TOC Transition Note (Addendum)
Transition of Care [facility]) - CM/SW Discharge Note   Patient Details  Name: Sandra Stout MRN: [id number] Date of Birth: Mar 23, 1945  [facility] CM/SW Contact:  Su Hilt, RN Phone Number: 10/07/2018, 3:05 PM   Clinical Narrative:     Patient lives alone but daughter lives down the street, her grand daughter also helps her.  She refuses [facility] services and said if she changes her mind she will let he PCP know, she stated she has had PT many times and they told her there is nothing they can do for her She has a RW in the room to take home.  Her daughter will   provide transportation She gets her medications from [facility] and can afford her meds  She has no other needs         Patient Goals and CMS Choice        Discharge Placement                       Discharge Plan and Services                                     Social Determinants of Health (SDOH) Interventions     Readmission Risk Interventions No flowsheet data found.

## 2018-10-07 NOTE — Evaluation (Signed)
Physical Therapy Evaluation Patient Details Name: Sandra Stout MRN: [id number] DOB: 14-Jan-1946 Today's Date: 10/07/2018   History of Present Illness  Pt is a 73 y.o. female presenting to hospital 10/06/18 with generalized weakness, muscle aches, cough, SOB, HA, and sore throat; also s/p fall to ground.  Pt admitted with acute respiratory failure with hypoxia secondary community acquired PNA.  PMH includes anxiety, PNA, ADD, narcolepsy, panic attacks, chronic back pain.  Clinical Impression  Prior to hospital admission, pt was independent with functional mobility.  Pt lives alone in 1 level home with 3-4 steps to enter with B railings.  Currently pt is independent with bed mobility, SBA with transfers using youth sized RW, SBA ambulating 200 feet with youth sized RW (pt appearing unsteady without UE support ambulating but improved balance and safety noted with use of walker); and pt able to safely navigate 4 steps with B railings.  Pt appearing anxious during session and appearing to perseverate on her h/o anxiety and narcolepsy and how it impacts her life (therapist provided supportive listening; nurse notified).  No c/o dizziness during session.  Generalized weakness noted.  Pt would benefit from skilled PT to address noted impairments and functional limitations (see below for any additional details).  Upon hospital discharge, recommend pt discharge with HHPT and support of family.    Follow Up Recommendations Home health PT    Equipment Recommendations  Rolling walker with 5" wheels(youth sized) (Care management notified)   Recommendations for Other Services       Precautions / Restrictions Precautions Precautions: Fall Restrictions Weight Bearing Restrictions: No      Mobility  Bed Mobility Overal bed mobility: Independent             General bed mobility comments: No difficulties noted supine to/from sit  Transfers Overall transfer level: Needs assistance Equipment used:  None;Rolling walker (2 wheeled) Transfers: Sit to/from [facility] Sit to Stand: Min guard;Supervision Stand pivot transfers: Min guard;Supervision       General transfer comment: pt requiring single UE support for balance with transfers (CGA for safety) but pt SBA with transfers using youth sized RW (initial vc's for walker use given); SBA toilet transfer  Ambulation/Gait Ambulation/Gait assistance: Min guard;Supervision Gait Distance (Feet): (10 feet to bathroom no AD; 200 feet with walker) Assistive device: None(youth sized RW)   Gait velocity: mildly decreased   General Gait Details: pt initially unsteady and reaching for objects for balance ambulating to bathroom (CGA for safety) but pt appeared steady ambulating with youth sized RW (initial vc's for walker use given)  Stairs Stairs: Yes Stairs assistance: Supervision Stair Management: Two rails;Step to pattern;Alternating pattern;Forwards Number of Stairs: 4 General stair comments: step to pattern ascending steps and alternating pattern descending steps; steady  Wheelchair Mobility    Modified Rankin (Stroke Patients Only)       Balance Overall balance assessment: Needs assistance Sitting-balance support: No upper extremity supported;Feet supported Sitting balance-Leahy Scale: Normal Sitting balance - Comments: steady sitting reaching outside BOS   Standing balance support: No upper extremity supported Standing balance-Leahy Scale: Good Standing balance comment: steady standing washing hands at sink, reaching to throw away paper towel, and leaning forward and to L side to adjust pillows on bed                             Pertinent Vitals/Pain Pain Assessment: No/denies pain  Vitals (HR and O2 on room air) stable  and WFL throughout treatment session.    Home Living Family/patient expects to be discharged to:: Private residence Living Arrangements: Alone Available Help at Discharge:  Family Type of Home: Mobile home Home Access: Stairs to enter Entrance Stairs-Rails: Right;Left;Can reach both Entrance Stairs-Number of Steps: 3-4 Home Layout: One level Home Equipment: None      Prior Function Level of Independence: Independent               Hand Dominance        Extremity/Trunk Assessment   Upper Extremity Assessment Upper Extremity Assessment: Generalized weakness    Lower Extremity Assessment Lower Extremity Assessment: Generalized weakness    Cervical / Trunk Assessment Cervical / Trunk Assessment: Normal  Communication   Communication: No difficulties  Cognition Arousal/Alertness: Awake/alert Behavior During Therapy: Anxious Overall Cognitive Status: Within Functional Limits for tasks assessed                                        General Comments   Nursing cleared pt for participation in physical therapy.  Pt agreeable to PT session.    Exercises  Gait training with youth sized RW   Assessment/Plan    PT Assessment Patient needs continued PT services  PT Problem List Decreased strength;Decreased balance;Decreased mobility;Decreased activity tolerance;Decreased knowledge of use of DME       PT Treatment Interventions DME instruction;Gait training;Stair training;Functional mobility training;Therapeutic activities;Therapeutic exercise;Balance training;Patient/family education    PT Goals (Current goals can be found in the Care Plan section)  Acute Rehab PT Goals Patient Stated Goal: to go home and get stronger PT Goal Formulation: With patient Time For Goal Achievement: 10/21/18 Potential to Achieve Goals: Good    Frequency Min 2X/week   Barriers to discharge        Co-evaluation               AM-PAC PT "6 Clicks" Mobility  Outcome Measure Help needed turning from your back to your side while in a flat bed without using bedrails?: None Help needed moving from lying on your back to sitting on the  side of a flat bed without using bedrails?: None Help needed moving to and from a bed to a chair (including a wheelchair)?: A Little Help needed standing up from a chair using your arms (e.g., wheelchair or bedside chair)?: A Little Help needed to walk in hospital room?: A Little Help needed climbing 3-5 steps with a railing? : A Little 6 Click Score: 20    End of Session Equipment Utilized During Treatment: Gait belt Activity Tolerance: Patient tolerated treatment well Patient left: in bed;with call bell/phone within reach;with bed alarm set Nurse Communication: Mobility status;Precautions PT Visit Diagnosis: Unsteadiness on feet (R26.81);Muscle weakness (generalized) (M62.81);History of falling (Z91.81)    Time: [id number] PT Time Calculation (min) (ACUTE ONLY): 24 min   Charges:   PT Evaluation $PT Eval Low Complexity: 1 Low PT Treatments $Gait Training: 8-22 mins       Hendricks LimesEmily Amirah Goerke, PT 10/07/18, 3:28 PM [phone]

## 2018-10-07 NOTE — Discharge Summary (Signed)
[facility] [place] [facility]   PATIENT NAME: Sandra Stout    MR#:  [id number]  DATE OF BIRTH:  1945-03-30  DATE OF ADMISSION:  10/06/2018 ADMITTING PHYSICIAN: Altamese DillingVaibhavkumar Vachhani, MD  DATE OF DISCHARGE: No discharge date for patient encounter.  PRIMARY CARE PHYSICIAN: Olevia PerchesJohnson, Megan P, DO    ADMISSION DIAGNOSIS:  COPD exacerbation (HCC) [J44.1] Generalized weakness [R53.1] Community acquired pneumonia of right lung, unspecified part of lung [J18.9]  DISCHARGE DIAGNOSIS:  Principal Problem:   Community acquired pneumonia Active Problems:   Acute respiratory failure with hypoxia (HCC)   Pneumonia   SECONDARY DIAGNOSIS:   Past Medical History:  Diagnosis Date  . ADD (attention deficit disorder)   . Anxiety   . Hemorrhoid   . Hypokalemia   . Narcolepsy   . Panic attacks   . Pneumonia     HOSPITAL COURSE:   [age] year old female with past medical history of ADHD, narcolepsy, history of panic attacks who presented to the hospital due to weakness and shortness of breath and noted to have pneumonia.  1.  Pneumonia-source of patient's weakness and shortness of breath. -This was noted on the chest x-ray on admission.  Patient was treated with IV ceftriaxone, Zithromax.  She is afebrile, hemodynamically stable, has a normal white cell count.  Blood cultures are negative. - She will be discharged on oral Levaquin for a few days.  2.  History of ADHD-patient will continue Ritalin  3.  Anxiety-continue patient's Xanax   DISCHARGE CONDITIONS:    CONSULTS OBTAINED:    Stable.   DRUG ALLERGIES:  No Known Allergies  DISCHARGE MEDICATIONS:   Allergies as of 10/07/2018   No Known Allergies     Medication List    TAKE these medications   levofloxacin 500 MG tablet Commonly known as: LEVAQUIN Take 1 tablet (500 mg total) by mouth daily for 5 days.   methylphenidate 20 MG tablet Commonly known as: RITALIN Take 20 mg by mouth 4 (four) times  daily.   Xanax 0.5 MG tablet Generic drug: ALPRAZolam Take 0.5 mg by mouth 2 (two) times daily.         DISCHARGE INSTRUCTIONS:   DIET:  Regular diet  DISCHARGE CONDITION:  Stable  ACTIVITY:  Activity as tolerated  OXYGEN:  Home Oxygen: No.   Oxygen Delivery: room air  DISCHARGE LOCATION:  home   If you experience worsening of your admission symptoms, develop shortness of breath, life threatening emergency, suicidal or homicidal thoughts you must seek medical attention immediately by calling 911 or calling your MD immediately  if symptoms less severe.  You Must read complete instructions/literature along with all the possible adverse reactions/side effects for all the Medicines you take and that have been prescribed to you. Take any new Medicines after you have completely understood and accpet all the possible adverse reactions/side effects.   Please note  You were cared for by a hospitalist during your hospital stay. If you have any questions about your discharge medications or the care you received while you were in the hospital after you are discharged, you can call the unit and asked to speak with the hospitalist on call if the hospitalist that took care of you is not available. Once you are discharged, your primary care physician will handle any further medical issues. Please note that NO REFILLS for any discharge medications will be authorized once you are discharged, as it is imperative that you return to your primary care physician (or establish  a relationship with a primary care physician if you do not have one) for your aftercare needs so that they can reassess your need for medications and monitor your lab values.     Today   No acute events overnight. Feels better. Not Hypoxic.  Afebrile. Will d/c home on Oral abx today.   VITAL SIGNS:  Blood pressure 108/69, pulse 94, temperature 97.6 F (36.4 C), temperature source Oral, resp. rate 17, height 5\' 2"  (1.575  m), weight 46.3 kg, SpO2 95 %.  I/O:    Intake/Output Summary (Last 24 hours) at 10/07/2018 1458 Last data filed at 10/07/2018 0913 Gross per 24 hour  Intake 590 ml  Output -  Net 590 ml    PHYSICAL EXAMINATION:  GENERAL:  73 y.o.-year-old thin patient lying in the bed with no acute distress.  EYES: Pupils equal, round, reactive to light and accommodation. No scleral icterus. Extraocular muscles intact.  HEENT: Head atraumatic, normocephalic. Oropharynx and nasopharynx clear.  NECK:  Supple, no jugular venous distention. No thyroid enlargement, no tenderness.  LUNGS: Normal breath sounds bilaterally, no wheezing, rales,rhonchi. No use of accessory muscles of respiration.  CARDIOVASCULAR: S1, S2 normal. No murmurs, rubs, or gallops.  ABDOMEN: Soft, non-tender, non-distended. Bowel sounds present. No organomegaly or mass.  EXTREMITIES: No pedal edema, cyanosis, or clubbing.  NEUROLOGIC: Cranial nerves II through XII are intact. No focal motor or sensory defecits b/l.  PSYCHIATRIC: The patient is alert and oriented x 3. SKIN: No obvious rash, lesion, or ulcer.   DATA REVIEW:   CBC Recent Labs  Lab 10/07/18 0443  WBC 7.5  HGB 13.2  HCT 39.5  PLT 295    Chemistries  Recent Labs  Lab 10/07/18 0443  NA 136  K 3.4*  CL 104  CO2 25  GLUCOSE 204*  BUN 17  CREATININE 0.68  CALCIUM 9.1  MG 2.1    Cardiac Enzymes No results for input(s): TROPONINI in the last 168 hours.  Microbiology Results  Results for orders placed or performed during the hospital encounter of 10/06/18  SARS Coronavirus 2 [facility](Hospital order, Performed in [facility] hospital lab) Nasopharyngeal Nasopharyngeal Swab     Status: None   Collection Time: 10/06/18  4:31 PM   Specimen: Nasopharyngeal Swab  Result Value Ref Range Status   SARS Coronavirus 2 NEGATIVE NEGATIVE Final    Comment: (NOTE) If result is NEGATIVE SARS-CoV-2 target nucleic acids are NOT DETECTED. The SARS-CoV-2 RNA is generally  detectable in upper and lower  respiratory specimens during the acute phase of infection. The lowest  concentration of SARS-CoV-2 viral copies this assay can detect is 250  copies / mL. A negative result does not preclude SARS-CoV-2 infection  and should not be used as the sole basis for treatment or other  patient management decisions.  A negative result may occur with  improper specimen collection / handling, submission of specimen other  than nasopharyngeal swab, presence of viral mutation(s) within the  areas targeted by this assay, and inadequate number of viral copies  (<250 copies / mL). A negative result must be combined with clinical  observations, patient history, and epidemiological information. If result is POSITIVE SARS-CoV-2 target nucleic acids are DETECTED. The SARS-CoV-2 RNA is generally detectable in upper and lower  respiratory specimens dur ing the acute phase of infection.  Positive  results are indicative of active infection with SARS-CoV-2.  Clinical  correlation with patient history and other diagnostic information is  necessary to determine patient infection status.  Positive results do  not rule out bacterial infection or co-infection with other viruses. If result is PRESUMPTIVE POSTIVE SARS-CoV-2 nucleic acids MAY BE PRESENT.   A presumptive positive result was obtained on the submitted specimen  and confirmed on repeat testing.  While 2019 novel coronavirus  (SARS-CoV-2) nucleic acids may be present in the submitted sample  additional confirmatory testing may be necessary for epidemiological  and / or clinical management purposes  to differentiate between  SARS-CoV-2 and other Sarbecovirus currently known to infect humans.  If clinically indicated additional testing with an alternate test  methodology [phone]) is advised. The SARS-CoV-2 RNA is generally  detectable in upper and lower respiratory sp ecimens during the acute  phase of infection. The  expected result is Negative. Fact Sheet for Patients:  StrictlyIdeas.no Fact Sheet for Healthcare Providers: BankingDealers.co.za This test is not yet approved or cleared by the [place] FDA and has been authorized for detection and/or diagnosis of SARS-CoV-2 by FDA under an Emergency Use Authorization (EUA).  This EUA will remain in effect (meaning this test can be used) for the duration of the COVID-19 declaration under Section 564(b)(1) of the Act, 21 U.S.C. section 360bbb-3(b)(1), unless the authorization is terminated or revoked sooner. Performed at [facility], [address] [place], [place] [address]     RADIOLOGY:  Dg Chest Portable 1 View  Result Date: 10/06/2018 CLINICAL DATA:  Shortness of breath EXAM: PORTABLE CHEST 1 VIEW COMPARISON:  February 20, 2016 FINDINGS: There is patchy airspace opacity seen predominantly at the right lower lung and peripheral right upper lung. There is superimposed chronic interstitial thickening at the bilateral lower lung bases as on prior exam. The cardiomediastinal silhouette is unchanged. There is atherosclerotic calcification the aortic knob with a tortuous descending aorta. No acute osseous abnormality. IMPRESSION: 1. Patchy airspace opacities within the right lung which are nonspecific, but concerning for atypical infection, which includes viral pneumonia. 2. Superimposed chronic interstitial lung changes at both lung bases. Electronically Signed   By: Prudencio Pair M.D.   On: 10/06/2018 16:26      Management plans discussed with the patient, family and they are in agreement.  CODE STATUS:     Code Status Orders  (From admission, onward)         Start     Ordered   10/06/18 2235  Full code  Continuous     10/06/18 2235        TOTAL TIME TAKING CARE OF THIS PATIENT: 40 minutes.    Henreitta Leber M.D on 10/07/2018 at 2:58 PM  Between 7am to 6pm - Pager -  [phone]  After 6pm go to [email or web address] - [employer] [facility]  Office  [phone]  CC: Primary care physician; Valerie Roys, DO

## 2018-10-07 NOTE — Progress Notes (Signed)
Pharmacy Electrolyte Monitoring Consult:  Pharmacy consulted to assist in monitoring and replacing electrolytes in this 73 y.o. female admitted on 10/06/2018 with Cough, Sore Throat, and Weakness   Labs:  Sodium (mmol/L)  Date Value  10/07/2018 136  02/03/2018 142  02/03/2014 145   Potassium (mmol/L)  Date Value  10/07/2018 3.4 (L)  02/03/2014 2.6 (L)   Magnesium (mg/dL)  Date Value  02/22/2016 2.0   Calcium (mg/dL)  Date Value  10/07/2018 9.1   Calcium, Total (mg/dL)  Date Value  02/03/2014 8.5   Albumin (g/dL)  Date Value  02/03/2018 4.6  02/03/2014 3.4    Assessment/Plan: Will order potassium 45mEq PO x 1.   Will check BMP with am labs.   Will replace to maintain electrolytes within normal limits.   Pharmacy will continue to monitor and adjust per consult.   Samika Vetsch L 10/07/2018 11:06 AM

## 2018-10-07 NOTE — Progress Notes (Signed)
Writer went over discharge instructions, gave script for antibiotic, patient verbalized understanding.  Patient transported to medical mall entrance where daughter was waiting. Patient had walker and personal possessions with her upon discharge.

## 2018-10-07 NOTE — Progress Notes (Signed)
PHARMACIST - PHYSICIAN COMMUNICATION  CONCERNING: Antibiotic IV to Oral Route Change Policy  RECOMMENDATION: This patient is receiving azithromycin by the intravenous route.  Based on criteria approved by the Pharmacy and Therapeutics Committee, the antibiotic(s) is/are being converted to the equivalent oral dose form(s).   DESCRIPTION: These criteria include:  Patient being treated for a respiratory tract infection, urinary tract infection, cellulitis or clostridium difficile associated diarrhea if on metronidazole  The patient is not neutropenic and does not exhibit a GI malabsorption state  The patient is eating (either orally or via tube) and/or has been taking other orally administered medications for a least 24 hours  The patient is improving clinically and has a Tmax < 100.5  If you have questions about this conversion, please contact the Pharmacy Department at [phone].   MLS, RPh,  8.12.20 1054

## 2018-10-07 NOTE — Plan of Care (Signed)
Patient adequate for discharge.

## 2018-10-07 NOTE — Care Management CC44 (Signed)
Condition Code 44 Documentation Completed  Patient Details  Name: NOVALEIGH KOHLMAN MRN: [id number] Date of Birth: 30-Nov-1945   Condition Code 44 given:  Yes Patient signature on Condition Code 44 notice:  Yes Documentation of 2 MD's agreement:  Yes Code 44 added to claim:  Yes    Su Hilt, RN 10/07/2018, 2:52 PM

## 2018-10-15 ENCOUNTER — Ambulatory Visit (INDEPENDENT_AMBULATORY_CARE_PROVIDER_SITE_OTHER): Payer: Medicare Other | Admitting: Family Medicine

## 2018-10-15 ENCOUNTER — Other Ambulatory Visit: Payer: Self-pay

## 2018-10-15 ENCOUNTER — Encounter: Payer: Self-pay | Admitting: Family Medicine

## 2018-10-15 VITALS — BP 102/69 | HR 108 | Temp 98.8°F | Wt 109.0 lb

## 2018-10-15 DIAGNOSIS — F339 Major depressive disorder, recurrent, unspecified: Secondary | ICD-10-CM

## 2018-10-15 DIAGNOSIS — B370 Candidal stomatitis: Secondary | ICD-10-CM | POA: Diagnosis not present

## 2018-10-15 DIAGNOSIS — R296 Repeated falls: Secondary | ICD-10-CM | POA: Diagnosis not present

## 2018-10-15 DIAGNOSIS — F419 Anxiety disorder, unspecified: Secondary | ICD-10-CM

## 2018-10-15 DIAGNOSIS — G47419 Narcolepsy without cataplexy: Secondary | ICD-10-CM | POA: Diagnosis not present

## 2018-10-15 DIAGNOSIS — J9601 Acute respiratory failure with hypoxia: Secondary | ICD-10-CM | POA: Diagnosis not present

## 2018-10-15 DIAGNOSIS — J189 Pneumonia, unspecified organism: Secondary | ICD-10-CM

## 2018-10-15 MED ORDER — NYSTATIN 100000 UNIT/ML MT SUSP: 5.0000 mL | Freq: Four times a day (QID) | OROMUCOSAL | 0 refills | Status: DC

## 2018-10-15 MED ORDER — VENLAFAXINE HCL ER 75 MG PO CP24: ORAL_CAPSULE | ORAL | 2 refills | Status: DC

## 2018-10-15 NOTE — Progress Notes (Signed)
BP 102/69   Pulse (!) 108   Temp 98.8 F (37.1 C) (Oral)   Wt 109 lb (49.4 kg)   LMP  (LMP Unknown)   SpO2 93%   BMI 19.94 kg/m    Subjective:    Patient ID: Sandra Stout, female    DOB: [birth year], 73 y.o.   MRN: [id number]  HPI: Sandra Stout is a 73 y.o. female  Chief Complaint  Patient presents with  . Hospitalization Follow-up   Transition of Care Hospital Follow up.   [facility] Physician: Dr. Cherlynn KaiserSainani D/C Date: 10/07/18  Records Requested: 10/15/18 Records Received: 10/15/18 Records Reviewed: 10/15/18  Diagnoses on Discharge: Community Acquired Pneumonia  Date of interactive Contact within 48 hours of discharge: 10/07/18 Contact was through: phone  Date of 7 day or 14 day face-to-face visit: 10/15/18  within 14 days  Outpatient Encounter Medications as of 10/15/2018  Medication Sig  . ALPRAZolam (XANAX) 0.5 MG tablet Take 0.5 mg by mouth 2 (two) times daily.   . methylphenidate (RITALIN) 20 MG tablet Take 20 mg by mouth 4 (four) times daily.  . naproxen (NAPROSYN) 500 MG tablet   . nystatin (MYCOSTATIN) 100000 UNIT/ML suspension Take 5 mLs (500,000 Units total) by mouth 4 (four) times daily.  [name]. venlafaxine XR (EFFEXOR-XR) 75 MG 24 hr capsule 1 pill daily for 2 weeks, then increase to 2 tabs daily   No facility-administered encounter medications on file as of 10/15/2018.   Per Hospitalist: " HOSPITAL COURSE:   73 year old female with past medical history of ADHD, narcolepsy, history of panic attacks who presented to the hospital due to weakness and shortness of breath and noted to have pneumonia.  1.  Pneumonia-source of patient's weakness and shortness of breath. -This was noted on the chest x-ray on admission.  Patient was treated with IV ceftriaxone, Zithromax.  She is afebrile, hemodynamically stable, has a normal white cell count.  Blood cultures are negative. - She will be discharged on oral Levaquin for a few days.  2.  History of  ADHD-patient will continue Ritalin  3.  Anxiety-continue patient's Xanax"  Diagnostic Tests Reviewed:  CLINICAL DATA:  Shortness of breath  EXAM: PORTABLE CHEST 1 VIEW  COMPARISON:  February 20, 2016  FINDINGS: There is patchy airspace opacity seen predominantly at the right lower lung and peripheral right upper lung. There is superimposed chronic interstitial thickening at the bilateral lower lung bases as on prior exam. The cardiomediastinal silhouette is unchanged. There is atherosclerotic calcification the aortic knob with a tortuous descending aorta. No acute osseous abnormality.  IMPRESSION: 1. Patchy airspace opacities within the right lung which are nonspecific, but concerning for atypical infection, which includes viral pneumonia. 2. Superimposed chronic interstitial lung changes at both lung bases.  Disposition: Home  Consults:  None  Discharge Instructions: Follow up here  Disease/illness Education: Given today  Home Health/Community Services Discussions/Referrals: Declined by patient   Establishment or re-establishment of referral orders for community resources:  N/A  Discussion with other health care providers: None  Assessment and Support of treatment regimen adherence: Good  Appointments Coordinated with:  Patient   Education for self-management, independent living, and ADLs: Given today  Sandra Stout presents today for hospital follow up. She finished her levaquin a couple of days ago. No SOB. No coughing or wheezing. + headaches. She has been sleeping well. She notes that she has been having a film on her tongue and feels like it's prickly. She has fallen 2x but  does not want any PT right now. She notes that she is very unsteady on her feet. She has known narcolepsy and follows with neurology. Their notes reviewed today.   ANXIETY/DEPRESSION Duration:exacerbated Anxious mood: yes  Excessive worrying: yes Irritability: no  Sweating: no Nausea: yes  Palpitations:no Hyperventilation: no Panic attacks: no Agoraphobia: no  Obscessions/compulsions: no Depressed mood: yes Depression screen [facility] 2/9 10/15/2018 02/03/2018 08/17/2014  Decreased Interest 3 3 3   Down, Depressed, Hopeless 3 3 3   PHQ - 2 Score 6 6 6   Altered sleeping 2 3 0  Tired, decreased energy 3 3 3   Change in appetite 3 1 0  Feeling bad or failure about yourself  3 3 0  Trouble concentrating 3 3 2   Moving slowly or fidgety/restless 3 3 3   Suicidal thoughts 0 0 0  PHQ-9 Score 23 22 14   Difficult doing work/chores Extremely dIfficult Extremely dIfficult Very difficult   GAD 7 : Generalized Anxiety Score 10/15/2018 02/03/2018  Nervous, Anxious, on Edge 3 3  Control/stop worrying 3 3  Worry too much - different things 3 3  Trouble relaxing 3 3  Restless 3 0  Easily annoyed or irritable 2 3  Afraid - awful might happen 2 3  Total GAD 7 Score 19 18  Anxiety Difficulty Very difficult -   Anhedonia: no Weight changes: no Insomnia: no   Hypersomnia: no Fatigue/loss of energy: yes Feelings of worthlessness: yes Feelings of guilt: yes Impaired concentration/indecisiveness: yes Suicidal ideations: no  Crying spells: yes Recent Stressors/Life Changes: yes  Relevant past medical, surgical, family and social history reviewed and updated as indicated. Interim medical history since our last visit reviewed. Allergies and medications reviewed and updated.  Review of Systems  Constitutional: Positive for fatigue. Negative for activity change, appetite change, chills, diaphoresis, fever and unexpected weight change.  HENT: Negative.   Eyes: Negative.   Respiratory: Negative.   Cardiovascular: Negative.   Genitourinary: Negative.   Musculoskeletal: Positive for gait problem. Negative for arthralgias, back pain, joint swelling, myalgias, neck pain and neck stiffness.  Skin: Negative.   Neurological: Positive for weakness. Negative for dizziness, tremors, seizures, syncope,  facial asymmetry, speech difficulty, light-headedness, numbness and headaches.  Psychiatric/Behavioral: Positive for decreased concentration and dysphoric mood. Negative for agitation, behavioral problems, confusion, hallucinations, self-injury, sleep disturbance and suicidal ideas. The patient is nervous/anxious. The patient is not hyperactive.     Per HPI unless specifically indicated above     Objective:    BP 102/69   Pulse (!) 108   Temp 98.8 F (37.1 C) (Oral)   Wt 109 lb (49.4 kg)   LMP  (LMP Unknown)   SpO2 93%   BMI 19.94 kg/m   Wt Readings from Last 3 Encounters:  10/15/18 109 lb (49.4 kg)  10/06/18 102 lb (46.3 kg)  02/03/18 115 lb (52.2 kg)    Physical Exam Vitals signs and nursing note reviewed.  Constitutional:      General: She is not in acute distress.    Appearance: Normal appearance. She is cachectic. She is ill-appearing. She is not toxic-appearing or diaphoretic.  HENT:     Head: Normocephalic and atraumatic.     Right Ear: External ear normal.     Left Ear: External ear normal.     Nose: Nose normal.     Mouth/Throat:     Mouth: Mucous membranes are moist.     Pharynx: Oropharyngeal exudate (white film and cracked tongue) present.  Eyes:  General: No scleral icterus.       Right eye: No discharge.        Left eye: No discharge.     Extraocular Movements: Extraocular movements intact.     Conjunctiva/sclera: Conjunctivae normal.     Pupils: Pupils are equal, round, and reactive to light.  Neck:     Musculoskeletal: Normal range of motion and neck supple.  Cardiovascular:     Rate and Rhythm: Normal rate and regular rhythm.     Pulses: Normal pulses.     Heart sounds: Normal heart sounds. No murmur. No friction rub. No gallop.   Pulmonary:     Effort: Pulmonary effort is normal. No respiratory distress.     Breath sounds: Normal breath sounds. No stridor. No wheezing, rhonchi or rales.  Chest:     Chest wall: No tenderness.  Musculoskeletal:  Normal range of motion.  Skin:    General: Skin is warm and dry.     Capillary Refill: Capillary refill takes less than 2 seconds.     Coloration: Skin is not jaundiced or pale.     Findings: No bruising, erythema, lesion or rash.  Neurological:     General: No focal deficit present.     Mental Status: She is alert and oriented to person, place, and time. Mental status is at baseline.  Psychiatric:        Mood and Affect: Mood is anxious.        Behavior: Behavior normal.        Thought Content: Thought content normal.        Judgment: Judgment normal.     Results for orders placed or performed during the hospital encounter of 10/06/18  SARS Coronavirus 2 [facility](Hospital order, Performed in [facility] lab) Nasopharyngeal Nasopharyngeal Swab   Specimen: Nasopharyngeal Swab  Result Value Ref Range   SARS Coronavirus 2 NEGATIVE NEGATIVE  CBC with Differential  Result Value Ref Range   WBC 9.9 4.0 - 10.5 K/uL   RBC 4.13 3.87 - 5.11 MIL/uL   Hemoglobin 12.8 12.0 - 15.0 g/dL   HCT 21.338.6 08.636.0 - 57.846.0 %   MCV 93.5 80.0 - 100.0 fL   MCH 31.0 26.0 - 34.0 pg   MCHC 33.2 30.0 - 36.0 g/dL   RDW 46.913.4 62.911.5 - 52.815.5 %   Platelets 299 150 - 400 K/uL   nRBC 0.0 0.0 - 0.2 %   Neutrophils Relative % 72 %   Neutro Abs 7.1 1.7 - 7.7 K/uL   Lymphocytes Relative 13 %   Lymphs Abs 1.3 0.7 - 4.0 K/uL   Monocytes Relative 14 %   Monocytes Absolute 1.3 (H) 0.1 - 1.0 K/uL   Eosinophils Relative 0 %   Eosinophils Absolute 0.0 0.0 - 0.5 K/uL   Basophils Relative 0 %   Basophils Absolute 0.0 0.0 - 0.1 K/uL   Immature Granulocytes 1 %   Abs Immature Granulocytes 0.05 0.00 - 0.07 K/uL  Basic metabolic panel  Result Value Ref Range   Sodium 134 (L) 135 - 145 mmol/L   Potassium 3.5 3.5 - 5.1 mmol/L   Chloride 98 98 - 111 mmol/L   CO2 25 22 - 32 mmol/L   Glucose, Bld 117 (H) 70 - 99 mg/dL   BUN 19 8 - 23 mg/dL   Creatinine, Ser 4.130.73 0.44 - 1.00 mg/dL   Calcium 9.0 8.9 - 24.410.3 mg/dL   GFR calc non  Af Amer >60 >60 mL/min   GFR calc  Af Amer >60 >60 mL/min   Anion gap 11 5 - 15  Brain natriuretic peptide  Result Value Ref Range   B Natriuretic Peptide 78.0 0.0 - 100.0 pg/mL  Procalcitonin - Baseline  Result Value Ref Range   Procalcitonin 0.32 ng/mL  Procalcitonin  Result Value Ref Range   Procalcitonin 0.25 ng/mL  Basic metabolic panel  Result Value Ref Range   Sodium 136 135 - 145 mmol/L   Potassium 3.4 (L) 3.5 - 5.1 mmol/L   Chloride 104 98 - 111 mmol/L   CO2 25 22 - 32 mmol/L   Glucose, Bld 204 (H) 70 - 99 mg/dL   BUN 17 8 - 23 mg/dL   Creatinine, Ser 1.610.68 0.44 - 1.00 mg/dL   Calcium 9.1 8.9 - 09.610.3 mg/dL   GFR calc non Af Amer >60 >60 mL/min   GFR calc Af Amer >60 >60 mL/min   Anion gap 7 5 - 15  CBC  Result Value Ref Range   WBC 7.5 4.0 - 10.5 K/uL   RBC 4.21 3.87 - 5.11 MIL/uL   Hemoglobin 13.2 12.0 - 15.0 g/dL   HCT 04.539.5 40.936.0 - 81.146.0 %   MCV 93.8 80.0 - 100.0 fL   MCH 31.4 26.0 - 34.0 pg   MCHC 33.4 30.0 - 36.0 g/dL   RDW 91.413.2 78.211.5 - 95.615.5 %   Platelets 295 150 - 400 K/uL   nRBC 0.0 0.0 - 0.2 %  Magnesium  Result Value Ref Range   Magnesium 2.1 1.7 - 2.4 mg/dL  Troponin I (High Sensitivity)  Result Value Ref Range   Troponin I (High Sensitivity) 8 <18 ng/L  Troponin I (High Sensitivity)  Result Value Ref Range   Troponin I (High Sensitivity) 9 <18 ng/L      Assessment & Plan:   Problem List Items Addressed This Visit      Respiratory   RESOLVED: Community acquired pneumonia - Primary    Resolved. Lungs clear. Continue to monitor. Call with any concerns.       RESOLVED: Acute respiratory failure with hypoxia (HCC)    Resolved. Lungs clear. Continue to monitor. Call with any concerns.         Other   Anxiety    Follow[place] with neurology in [place]. They have continued her ritalin and her xanax, but want her to see psychiatrist. They will not increase her xanax dose. She has been unwilling to see psychiatry.       Relevant Medications    venlafaxine XR (EFFEXOR-XR) 75 MG 24 hr capsule   Other Relevant Orders   Referral to Chronic Care Management Services   Ambulatory referral to Psychiatry   Primary narcolepsy without cataplexy    Follows with neurology in [place]. They have continued her ritalin and her xanax, but want her to see psychiatrist. They will not increase her xanax dose. She has been unwilling to see psychiatry. Their notes reviewed today. New referral to psychiatry with goal of telepsychiatry placed today.      Depression, recurrent (HCC)    Has stopped her effexor for unclear reason. Maintained on xanax by neurology- but they want her to see psychiatry. She has been unwilling to do this. Not under good control. Will restart her effexor and see her back in 1-2 months for follow up on mood and physical.      Relevant Medications   venlafaxine XR (EFFEXOR-XR) 75 MG 24 hr capsule   Other Relevant Orders   Referral to Chronic Care  Management Services   Ambulatory referral to Psychiatry   Recurrent falls    Offered referral for home health for PT- she didn't want to do this at this time. Will call if she changes her mind.        Other Visit Diagnoses    Thrush       Will treat with nystatin mouth wash. Call with any concerns.    Relevant Medications   nystatin (MYCOSTATIN) 100000 UNIT/ML suspension       Follow up plan: Return As able for physical and follow up.

## 2018-10-15 NOTE — Patient Instructions (Signed)
Community-Acquired Pneumonia, Adult °Pneumonia is a type of lung infection that causes swelling in the airways of the lungs. Mucus and fluid may also build up inside the airways. This may cause coughing and difficulty breathing. °There are different types of pneumonia. One type can develop while a person is in a hospital. A different type is called community-acquired pneumonia. It develops in people who are not, and have not recently been, in the hospital or another type of health care facility. °What are the causes? °This condition may be caused by: °· Viruses. This is the most common cause of pneumonia. °· Bacteria. Community-acquired pneumonia is often caused by Streptococcus pneumoniae bacteria. These bacteria are often passed from one person to another by breathing in droplets from the cough or sneeze of an infected person. °· Fungi. This is the least common cause of pneumonia. °What increases the risk? °The following factors may make you more likely to develop this condition: °· Having a chronic disease, such as chronic obstructive pulmonary disease (COPD), asthma, congestive heart failure, cystic fibrosis, diabetes, or kidney disease. °· Having early-stage or late-stage HIV. °· Having sickle cell disease. °· Having had your spleen removed (splenectomy). °· Having poor dental hygiene. °· Having a medical condition that increases the risk of breathing in (aspirating) secretions from your own mouth and nose. °· Having a weakened body defense system (immune system). °· Being a smoker. °· Traveling to areas where pneumonia-causing germs commonly exist. °· Being around animal habitats or animals that have pneumonia-causing germs, including birds, bats, rabbits, cats, and farm animals. °What are the signs or symptoms? °Symptoms of this condition include: °· A dry cough. °· A wet (productive) cough. °· Fever. °· Sweating. °· Chest pain, especially when breathing deeply or coughing. °· Rapid breathing or difficulty  breathing. °· Shortness of breath. °· Shaking chills. °· Fatigue. °· Muscle aches. °How is this diagnosed? °This condition may be diagnosed based on: °· Your medical history. °· A physical exam. °You may also have tests, including: °· Chest X-rays. °· Tests of your blood oxygen level and other blood gases. °· Tests on blood, mucus (sputum), fluid around your lungs (pleural fluid), and urine. °If your pneumonia is severe, other tests may be done to find the exact cause of your illness. °How is this treated? °Treatment for this condition depends on many factors, such as the cause of your pneumonia, the medicines you take, and other medical conditions that you have. °For most adults, treatment and recovery from pneumonia may occur at home. In some cases, treatment must happen in a hospital. Treatment may include: °· Medicines that are given by mouth or through an IV, including: °? Antibiotic medicines, if the pneumonia was caused by bacteria. °? Antiviral medicines, if the pneumonia was caused by a virus. °· Being given extra oxygen. °· Respiratory therapy. °Although rare, treating severe pneumonia may include: °· Using a machine to help you breathe (mechanical ventilation). This is done if you are not breathing well on your own and you cannot maintain a safe blood oxygen level. °· Thoracentesis. This is a procedure to remove fluid from around one lung or both lungs to help you breathe better. °Follow these instructions at home: ° °Medicines °· Take over-the-counter and prescription medicines only as told by your health care provider. °? Only take cough medicine if you are losing sleep. Be aware that cough medicine can prevent your body's natural ability to remove mucus from your lungs. °· If you were prescribed an antibiotic   medicine, take it as told by your health care provider. Do not stop taking the antibiotic even if you start to feel better. °General instructions °· Sleep in a semi-upright position at night. Try  sleeping in a reclining chair, or place a few pillows under your head. °· Rest as needed and get at least 8 hours of sleep each night. °· Drink enough water to keep your urine pale yellow. This will help to thin out mucus secretions in your lungs. °· Eat a healthy diet that includes plenty of vegetables, fruits, whole grains, low-fat dairy products, and lean protein. °· Do not use any products that contain nicotine or tobacco, such as cigarettes, e-cigarettes, and chewing tobacco. If you need help quitting, ask your health care provider. °· Keep all follow-up visits as told by your health care provider. This is important. °How is this prevented? °You can lower your risk of developing community-acquired pneumonia by: °· Getting a pneumococcal vaccine. There are different types and schedules of pneumococcal vaccines. Ask your health care provider which option is best for you. Consider getting the vaccine if: °? You are older than 74 years of age. °? You are older than 74 years of age and are undergoing cancer treatment, have chronic lung disease, or have other medical conditions that affect your immune system. Ask your health care provider if this applies to you. °· Getting an influenza vaccine every year. Ask your health care provider which type of vaccine is best for you. °· Getting regular checkups from your dentist. °· Washing your hands often. If soap and water are not available, use hand sanitizer. °Contact a health care provider if: °· You have a fever. °· You are losing sleep because you cannot control your cough with cough medicine. °Get help right away if: °· You have worsening shortness of breath. °· You have increased chest pain. °· Your sickness becomes worse, especially if you are an older adult or have a weakened immune system. °· You cough up blood. °Summary °· Pneumonia is an infection of the lungs. °· Community-acquired pneumonia develops in people who have not been in the hospital. It can be caused  by bacteria, viruses, or fungi. °· This condition may be treated with antibiotics or antiviral medicines. °· Severe cases may require hospitalization, mechanical ventilation, and other procedures to drain fluid from the lungs. °This information is not intended to replace advice given to you by your health care provider. Make sure you discuss any questions you have with your health care provider. °Document Released: 02/11/2005 Document Revised: 10/09/2017 Document Reviewed: 10/09/2017 °Elsevier Patient Education © 2020 [employer] ° °

## 2018-10-15 NOTE — Assessment & Plan Note (Signed)
Resolved. Lungs clear. Continue to monitor. Call with any concerns.  

## 2018-10-15 NOTE — Assessment & Plan Note (Addendum)
Follows with neurology in [place]. They have continued her ritalin and her xanax, but want her to see psychiatrist. They will not increase her xanax dose. She has been unwilling to see psychiatry. Their notes reviewed today. New referral to psychiatry with goal of telepsychiatry placed today.

## 2018-10-15 NOTE — Assessment & Plan Note (Signed)
Follows with neurology in [place]. They have continued her ritalin and her xanax, but want her to see psychiatrist. They will not increase her xanax dose. She has been unwilling to see psychiatry.

## 2018-10-15 NOTE — Assessment & Plan Note (Signed)
Offered referral for home health for PT- she didn't want to do this at this time. Will call if she changes her mind.

## 2018-10-15 NOTE — Assessment & Plan Note (Signed)
>>  ASSESSMENT AND PLAN FOR DEPRESSION, RECURRENT (HCC) WRITTEN ON 10/15/2018  3:10 PM BY JOHNSON, MEGAN P, DO  Has stopped her effexor for unclear reason. Maintained on xanax by neurology- but they want her to see psychiatry. She has been unwilling to do this. Not under good control. Will restart her effexor and see her back in 1-2 months for follow up on mood and physical.

## 2018-10-15 NOTE — Assessment & Plan Note (Signed)
Has stopped her effexor for unclear reason. Maintained on xanax by neurology- but they want her to see psychiatry. She has been unwilling to do this. Not under good control. Will restart her effexor and see her back in 1-2 months for follow up on mood and physical.

## 2018-11-17 ENCOUNTER — Other Ambulatory Visit: Payer: Self-pay | Admitting: Family Medicine

## 2018-11-17 DIAGNOSIS — Z1231 Encounter for screening mammogram for malignant neoplasm of breast: Secondary | ICD-10-CM

## 2018-11-25 ENCOUNTER — Ambulatory Visit: Payer: Self-pay | Admitting: Licensed Clinical Social Worker

## 2018-11-25 ENCOUNTER — Telehealth: Payer: Self-pay

## 2018-11-25 NOTE — Chronic Care Management (AMB) (Signed)
  Care Management   Follow Up Note   11/25/2018 Name: Sandra Stout MRN: [id number] DOB: 20-Jul-1945  Referred by: Valerie Roys, DO Reason for referral : Care Coordination   Sandra Stout is a 73 y.o. year old female who is a primary care patient of Valerie Roys, DO. The care management team was consulted for assistance with care management and care coordination needs.    Review of patient status, including review of consultants reports, relevant laboratory and other test results, and collaboration with appropriate care team members and the patient's provider was performed as part of comprehensive patient evaluation and provision of chronic care management services.    LCSW completed CCM outreach attempt today but was unable to reach patient successfully. A HIPPA compliant voice message was left encouraging patient to return call once available. LCSW rescheduled CCM SW appointment as well.  A HIPPA compliant phone message was left for the patient providing contact information and requesting a return call.   [name], BSW, MSW, [facility] Care Management [email or web address] Phone: [phone]

## 2018-11-26 ENCOUNTER — Encounter: Payer: Medicare Other | Admitting: Family Medicine

## 2018-12-08 ENCOUNTER — Telehealth: Payer: Self-pay

## 2018-12-08 ENCOUNTER — Ambulatory Visit: Payer: Self-pay | Admitting: Licensed Clinical Social Worker

## 2018-12-08 NOTE — Chronic Care Management (AMB) (Signed)
  Care Management   Follow Up Note   12/08/2018 Name: SHEREENA BERQUIST MRN: [id number] DOB: January 16, 1946  Referred by: Valerie Roys, DO Reason for referral : Care Coordination   MAJESTY STEHLIN is a 73 y.o. year old female who is a primary care patient of Valerie Roys, DO. The care management team was consulted for assistance with care management and care coordination needs.    Review of patient status, including review of consultants reports, relevant laboratory and other test results, and collaboration with appropriate care team members and the patient's provider was performed as part of comprehensive patient evaluation and provision of chronic care management services.    LCSW completed CCM outreach attempt today but was unable to reach patient successfully. A HIPPA compliant voice message was left encouraging patient to return call once available. LCSW rescheduled CCM SW appointment as well.  A HIPPA compliant phone message was left for the patient providing contact information and requesting a return call.   [name], BSW, MSW, [facility] Care Management [email or web address] Phone: [phone]

## 2019-01-25 ENCOUNTER — Ambulatory Visit (INDEPENDENT_AMBULATORY_CARE_PROVIDER_SITE_OTHER): Payer: Medicare Other | Admitting: Licensed Clinical Social Worker

## 2019-01-25 DIAGNOSIS — F339 Major depressive disorder, recurrent, unspecified: Secondary | ICD-10-CM

## 2019-01-25 DIAGNOSIS — F419 Anxiety disorder, unspecified: Secondary | ICD-10-CM

## 2019-01-26 NOTE — Chronic Care Management (AMB) (Signed)
Chronic Care Management    Clinical Social Work General Note  01/26/2019 Name: Sandra Stout MRN: [id number] DOB: 16-Apr-1945  Sandra Stout is a 73 y.o. year old female who is a primary care patient of Sandra Roys, DO. The CCM was consulted to assist the patient with Mental Health Counseling and Resources.   Sandra Stout was given information about Chronic Care Management services today including:  1. CCM service includes personalized support from designated clinical staff supervised by her physician, including individualized plan of care and coordination with other care providers 2. 24/7 contact phone numbers for assistance for urgent and routine care needs. 3. Service will only be billed when office clinical staff spend 20 minutes or more in a month to coordinate care. 4. Only one practitioner may furnish and bill the service in a calendar month. 5. The patient may stop CCM services at any time (effective at the end of the month) by phone call to the office staff. 6. The patient will be responsible for cost sharing (co-pay) of up to 20% of the service fee (after annual deductible is met).  Patient agreed to services and verbal consent obtained.   Review of patient status, including review of consultants reports, relevant laboratory and other test results, and collaboration with appropriate care team members and the patient's provider was performed as part of comprehensive patient evaluation and provision of chronic care management services.    SDOH (Social Determinants of Health) screening performed today. See Care Plan Entry related to challenges with: Depression    Outpatient Encounter Medications as of 01/25/2019  Medication Sig  . ALPRAZolam (XANAX) 0.5 MG tablet Take 0.5 mg by mouth 2 (two) times daily.   . methylphenidate (RITALIN) 20 MG tablet Take 20 mg by mouth 4 (four) times daily.  . naproxen (NAPROSYN) 500 MG tablet   . nystatin (MYCOSTATIN) 100000 UNIT/ML suspension Take 5  mLs (500,000 Units total) by mouth 4 (four) times daily.  Sandra Stout venlafaxine XR (EFFEXOR-XR) 75 MG 24 hr capsule 1 pill daily for 2 weeks, then increase to 2 tabs daily   No facility-administered encounter medications on file as of 01/25/2019.     Goals Addressed    . I want to work on my depression (pt-stated)       Current Barriers:  . Chronic Mental Health needs related to Depression and Anxiety . Limited social support . ADL IADL limitations . Mental Health Concerns  . Social Isolation . Lacks knowledge of community resource: long term mental health support resources within her nearby area . Suicidal Ideation/Homicidal Ideation: No  Clinical Social Work Goal(s):  Sandra Stout Over the next 120 days, patient will work with SW  bi-monthly  by telephone or in person to reduce or manage symptoms related to depression . Over the next 120 days, patient will work with SW to address concerns related to gaining appropriate self-care education and coping skills.   Interventions: . Patient interviewed and appropriate assessments performed: brief mental health assessment . Patient interviewed and appropriate assessments performed . Provided patient with information about available mental health support resources within the area . Discussed plans with patient for ongoing care management follow up and provided patient with direct contact information for care management team . Assisted patient/caregiver with obtaining information about health plan benefits . Provided education and assistance to client regarding Advanced Directives. . A voluntary and extensive discussion about advanced care planning including explanation and discussion of advanced was undertaken with the patient. Explanation regarding  healthcare proxy and living will was reviewed and packet with forms with explanation of how to fill them out was given.   . Provided education to patient/caregiver about Hospice and/or Palliative Care services .  Encouraged patient to consider long term follow up and therapy/counseling. Patient reports that she will consider this after the new year. . Brief CBT provided during session . Patient is very fearful to g . Patient reports that her depression "comes in waves." She admits that she has a great fear of getting COVID. LCSW provided reflective listening to patient as this has caused her great anxiety. Patient does not have a car. Patient reports that her family does not understands her diagnoses which causes her depression too. Patient reports that her granddaughter just recently moved to [facility] and she was able to spend the holiday with her.    Patient Self Care Activities:  . Self administers medications as prescribed . Attends all scheduled provider appointments  Patient Coping Strengths:  . Hopefulness . Self Advocate  Patient Self Care Deficits:  . Lacks social connections  Initial goal documentation   Follow Up Plan: SW will follow up with patient by phone over the next quarter     Sandra Stout, [place], MSW, [email or web address] Phone: [phone]

## 2019-01-29 ENCOUNTER — Ambulatory Visit
Admission: RE | Admit: 2019-01-29 | Discharge: 2019-01-29 | Disposition: A | Discharge status: Home / Self Care | Payer: Medicare Other | Source: Ambulatory Visit | Attending: Family Medicine | Admitting: Family Medicine

## 2019-01-29 DIAGNOSIS — Z1231 Encounter for screening mammogram for malignant neoplasm of breast: Secondary | ICD-10-CM | POA: Diagnosis not present

## 2019-02-16 ENCOUNTER — Encounter: Payer: Self-pay | Admitting: Family Medicine

## 2019-04-05 ENCOUNTER — Ambulatory Visit: Payer: Self-pay

## 2019-04-05 NOTE — Chronic Care Management (AMB) (Signed)
  Care Management   Follow Up Note   04/05/2019 Name: Sandra Stout MRN: [id number] DOB: 1945-03-30  Referred by: [name], DO Reason for referral : Care Coordination   Sandra Stout is a 74 y.o. year old female who is a primary care patient of [name], DO. The care management team was consulted for assistance with care management and care coordination needs.    Review of patient status, including review of consultants reports, relevant laboratory and other test results, and collaboration with appropriate care team members and the patient's provider was performed as part of comprehensive patient evaluation and provision of chronic care management services.    LCSW completed CCM outreach attempt today but was unable to reach patient successfully. A HIPPA compliant voice message was left encouraging patient to return call once available. LCSW rescheduled CCM SW appointment as well.  A HIPPA compliant phone message was left for the patient providing contact information and requesting a return call.   Dickie La, BSW, MSW, LCSW [facility] Care Management [place]  [facility] [email or web address] Phone: [phone]

## 2019-06-07 ENCOUNTER — Ambulatory Visit: Payer: Self-pay

## 2019-06-07 NOTE — Chronic Care Management (AMB) (Signed)
  Care Management   Follow Up Note   06/07/2019 Name: OLETA GUNNOE MRN: [id number] DOB: 1946-02-25  Referred by: [name], DO Reason for referral : Care Coordination   CHAMILLE WERNTZ is a 74 y.o. year old female who is a primary care patient of [name], DO. The care management team was consulted for assistance with care management and care coordination needs.    Review of patient status, including review of consultants reports, relevant laboratory and other test results, and collaboration with appropriate care team members and the patient's provider was performed as part of comprehensive patient evaluation and provision of chronic care management services.    LCSW completed CCM outreach attempt today but was unable to reach patient successfully. A HIPPA compliant voice message was left encouraging patient to return call once available. LCSW rescheduled CCM SW appointment as well.  A HIPPA compliant phone message was left for the patient providing contact information and requesting a return call.   Dickie La, BSW, MSW, LCSW [facility] Care Management [place]  [facility] [email or web address] Phone: [phone]

## 2019-06-15 LAB — FECAL OCCULT BLOOD, IMMUNOCHEMICAL: IFOBT: NEGATIVE

## 2019-06-22 DIAGNOSIS — G47419 Narcolepsy without cataplexy: Secondary | ICD-10-CM | POA: Diagnosis not present

## 2019-06-28 DIAGNOSIS — Z03818 Encounter for observation for suspected exposure to other biological agents ruled out: Secondary | ICD-10-CM | POA: Diagnosis not present

## 2019-07-01 ENCOUNTER — Encounter: Payer: Self-pay | Admitting: Family Medicine

## 2019-07-01 ENCOUNTER — Encounter: Payer: Medicare Other | Admitting: Family Medicine

## 2019-07-01 ENCOUNTER — Ambulatory Visit (INDEPENDENT_AMBULATORY_CARE_PROVIDER_SITE_OTHER): Payer: Medicare Other | Admitting: Family Medicine

## 2019-07-01 ENCOUNTER — Telehealth: Payer: Medicare Other | Admitting: Family Medicine

## 2019-07-01 DIAGNOSIS — J0100 Acute maxillary sinusitis, unspecified: Secondary | ICD-10-CM | POA: Diagnosis not present

## 2019-07-01 MED ORDER — AMOXICILLIN-POT CLAVULANATE 875-125 MG PO TABS: 1.0000 | ORAL_TABLET | Freq: Two times a day (BID) | ORAL | 0 refills | Status: DC

## 2019-07-01 MED ORDER — PREDNISONE 50 MG PO TABS: 50.0000 mg | ORAL_TABLET | Freq: Every day | ORAL | 0 refills | Status: DC

## 2019-07-01 NOTE — Progress Notes (Deleted)
LMP  (LMP Unknown)    Subjective:    Patient ID: Sandra Stout, female    DOB: 1945-11-16, 74 y.o.   MRN: [id number]  HPI: Sandra Stout is a 74 y.o. female presenting on 07/01/2019 for comprehensive medical examination. Current medical complaints include:{Blank single:19197::"none","***"}  She currently lives with: Menopausal Symptoms: {Blank single:19197::"yes","no"}  Functional Status Survey:    Fall Risk  10/15/2018 08/17/2014  Falls in the past year? 1 Yes  Number falls in past yr: 1 2 or more  Injury with Fall? 0 Yes  Risk Factor Category  - High Fall Risk  Risk for fall due to : Impaired balance/gait;History of fall(s);Medication side effect Medication side effect  Follow up - Follow up appointment    Depression Screen Depression screen [facility] 2/9 10/15/2018 02/03/2018 08/17/2014  Decreased Interest 3 3 3   Down, Depressed, Hopeless 3 3 3   PHQ - 2 Score 6 6 6   Altered sleeping 2 3 0  Tired, decreased energy 3 3 3   Change in appetite 3 1 0  Feeling bad or failure about yourself  3 3 0  Trouble concentrating 3 3 2   Moving slowly or fidgety/restless 3 3 3   Suicidal thoughts 0 0 0  PHQ-9 Score 23 22 14   Difficult doing work/chores Extremely dIfficult Extremely dIfficult Very difficult     Advanced Directives Does patient have a HCPOA?    {Blank single:19197::"yes","no"} If yes, name and contact information:  Does patient have a living will or MOST form?  {Blank single:19197::"yes","no"}  Past Medical History:  Past Medical History:  Diagnosis Date  . Acute respiratory failure with hypoxia (HCC) 10/06/2018  . ADD (attention deficit disorder)   . Anxiety   . Community acquired pneumonia 10/06/2018  . Hemorrhoid   . Hypokalemia   . Narcolepsy   . Panic attacks   . Pneumonia     Surgical History:  Past Surgical History:  Procedure Laterality Date  . TUBAL LIGATION      Medications:  Current Outpatient Medications on File Prior to Visit  Medication Sig  .  ALPRAZolam (XANAX) 0.5 MG tablet Take 0.5 mg by mouth 2 (two) times daily.   . methylphenidate (RITALIN) 20 MG tablet Take 20 mg by mouth 4 (four) times daily.  . naproxen (NAPROSYN) 500 MG tablet   . nystatin (MYCOSTATIN) 100000 UNIT/ML suspension Take 5 mLs (500,000 Units total) by mouth 4 (four) times daily.  venlafaxine XR (EFFEXOR-XR) 75 MG 24 hr capsule 1 pill daily for 2 weeks, then increase to 2 tabs daily   No current facility-administered medications on file prior to visit.    Allergies:  No Known Allergies  Social History:  Social History   Socioeconomic History  . Marital status: Married    Spouse name: Not on file  . Number of children: Not on file  . Years of education: Not on file  . Highest education level: Not on file  Occupational History  . Not on file  Tobacco Use  . Smoking status: Current Every Day Smoker    Packs/day: 0.50    Types: Cigarettes  . Smokeless tobacco: Never Used  Substance and Sexual Activity  . Alcohol use: No  . Drug use: No  . Sexual activity: Never  Other Topics Concern  . Not on file  Social History Narrative  . Not on file   Social Determinants of Health   Financial Resource Strain:   . Difficulty of Paying Living Expenses:   Food  Insecurity:   . Worried About Programme researcher, broadcasting/film/video in the Last Year:   . Barista in the Last Year:   Transportation Needs:   . Freight forwarder (Medical):   [name] Lack of Transportation (Non-Medical):   Physical Activity:   . Days of Exercise per Week:   . Minutes of Exercise per Session:   Stress:   . Feeling of Stress :   Social Connections:   . Frequency of Communication with Friends and Family:   . Frequency of Social Gatherings with Friends and Family:   . Attends Religious Services:   . Active Member of Clubs or Organizations:   . Attends Banker Meetings:   [name] Marital Status:   Intimate Partner Violence:   . Fear of Current or Ex-Partner:   . Emotionally  Abused:   [name] Physically Abused:   . Sexually Abused:    Social History   Tobacco Use  Smoking Status Current Every Day Smoker  . Packs/day: 0.50  . Types: Cigarettes  Smokeless Tobacco Never Used   Social History   Substance and Sexual Activity  Alcohol Use No    Family History:  Family History  Problem Relation Age of Onset  . Lung disease Mother   . Thyroid disease Sister   . Lung disease Sister   . Cancer Sister        Bladder  . Heart disease Father   . Breast cancer Neg Hx     Past medical history, surgical history, medications, allergies, family history and social history reviewed with patient today and changes made to appropriate areas of the chart.   ROS  All other ROS negative except what is listed above and in the HPI.      Objective:    LMP  (LMP Unknown)   Wt Readings from Last 3 Encounters:  10/15/18 109 lb (49.4 kg)  10/06/18 102 lb (46.3 kg)  02/03/18 115 lb (52.2 kg)    Physical Exam  Cognitive Testing - 6-CIT  Correct? Score   What year is it? {YES NO:22349} {Numbers; 0-4:31231} Yes = 0    No = 4  What month is it? {YES NO:22349} {Numbers; 0-4:31231} Yes = 0    No = 3  Remember:     Floyde Parkins, [address], [place]     What time is it? {YES NO:22349} {Numbers; 0-4:31231} Yes = 0    No = 3  Count backwards from 20 to 1 {YES NO:22349} {Numbers; 0-4:31231} Correct = 0    1 error = 2   More than 1 error = 4  Say the months of the year in reverse. {YES NO:22349} {Numbers; 0-4:31231} Correct = 0    1 error = 2   More than 1 error = 4  What address did I ask you to remember? {YES NO:22349} {NUMBERS; 0-10:5044} Correct = 0  1 error = 2    2 error = 4    3 error = 6    4 error = 8    All wrong = 10       TOTAL SCORE  {Numbers; 5-99:35701}/77   Interpretation:  {Desc; normal/abnormal:11317::"Normal"}  Normal (0-7) Abnormal (8-28)   Results for orders placed or performed during the hospital encounter of 10/06/18  SARS Coronavirus 2 [facility]  order, Performed in [facility] hospital lab) Nasopharyngeal Nasopharyngeal Swab   Specimen: Nasopharyngeal Swab  Result Value Ref Range   SARS Coronavirus 2 NEGATIVE  NEGATIVE  CBC with Differential  Result Value Ref Range   WBC 9.9 4.0 - 10.5 K/uL   RBC 4.13 3.87 - 5.11 MIL/uL   Hemoglobin 12.8 12.0 - 15.0 g/dL   HCT 38.6 36.0 - 46.0 %   MCV 93.5 80.0 - 100.0 fL   MCH 31.0 26.0 - 34.0 pg   MCHC 33.2 30.0 - 36.0 g/dL   RDW 13.4 11.5 - 15.5 %   Platelets 299 150 - 400 K/uL   nRBC 0.0 0.0 - 0.2 %   Neutrophils Relative % 72 %   Neutro Abs 7.1 1.7 - 7.7 K/uL   Lymphocytes Relative 13 %   Lymphs Abs 1.3 0.7 - 4.0 K/uL   Monocytes Relative 14 %   Monocytes Absolute 1.3 (H) 0.1 - 1.0 K/uL   Eosinophils Relative 0 %   Eosinophils Absolute 0.0 0.0 - 0.5 K/uL   Basophils Relative 0 %   Basophils Absolute 0.0 0.0 - 0.1 K/uL   Immature Granulocytes 1 %   Abs Immature Granulocytes 0.05 0.00 - 0.07 K/uL  Basic metabolic panel  Result Value Ref Range   Sodium 134 (L) 135 - 145 mmol/L   Potassium 3.5 3.5 - 5.1 mmol/L   Chloride 98 98 - 111 mmol/L   CO2 25 22 - 32 mmol/L   Glucose, Bld 117 (H) 70 - 99 mg/dL   BUN 19 8 - 23 mg/dL   Creatinine, Ser 0.73 0.44 - 1.00 mg/dL   Calcium 9.0 8.9 - 10.3 mg/dL   GFR calc non Af Amer >60 >60 mL/min   GFR calc Af Amer >60 >60 mL/min   Anion gap 11 5 - 15  Brain natriuretic peptide  Result Value Ref Range   B Natriuretic Peptide 78.0 0.0 - 100.0 pg/mL  Procalcitonin - Baseline  Result Value Ref Range   Procalcitonin 0.32 ng/mL  Procalcitonin  Result Value Ref Range   Procalcitonin 0.25 ng/mL  Basic metabolic panel  Result Value Ref Range   Sodium 136 135 - 145 mmol/L   Potassium 3.4 (L) 3.5 - 5.1 mmol/L   Chloride 104 98 - 111 mmol/L   CO2 25 22 - 32 mmol/L   Glucose, Bld 204 (H) 70 - 99 mg/dL   BUN 17 8 - 23 mg/dL   Creatinine, Ser 0.68 0.44 - 1.00 mg/dL   Calcium 9.1 8.9 - 10.3 mg/dL   GFR calc non Af Amer >60 >60 mL/min   GFR calc  Af Amer >60 >60 mL/min   Anion gap 7 5 - 15  CBC  Result Value Ref Range   WBC 7.5 4.0 - 10.5 K/uL   RBC 4.21 3.87 - 5.11 MIL/uL   Hemoglobin 13.2 12.0 - 15.0 g/dL   HCT 39.5 36.0 - 46.0 %   MCV 93.8 80.0 - 100.0 fL   MCH 31.4 26.0 - 34.0 pg   MCHC 33.4 30.0 - 36.0 g/dL   RDW 13.2 11.5 - 15.5 %   Platelets 295 150 - 400 K/uL   nRBC 0.0 0.0 - 0.2 %  Magnesium  Result Value Ref Range   Magnesium 2.1 1.7 - 2.4 mg/dL  Troponin I (High Sensitivity)  Result Value Ref Range   Troponin I (High Sensitivity) 8 <18 ng/L  Troponin I (High Sensitivity)  Result Value Ref Range   Troponin I (High Sensitivity) 9 <18 ng/L      Assessment & Plan:   Problem List Items Addressed This Visit      Other  Anxiety   Primary narcolepsy without cataplexy   Depression, recurrent (HCC)    Other Visit Diagnoses    Routine general medical examination at a health care facility    -  Primary   Screening for cholesterol level       Tobacco abuse       Encounter for hepatitis C screening test for low risk patient           Preventative Services:  AAA screening:  Health Risk Assessment and Personalized Prevention Plan: Bone Mass Measurements: Breast Cancer Screening: CVD Screening:  Cervical Cancer Screening: Colon Cancer Screening:  Depression Screening:  Diabetes Screening:  Glaucoma Screening:  Hepatitis B vaccine: Hepatitis C screening:  HIV Screening: Flu Vaccine: Lung cancer Screening: Obesity Screening:  Pneumonia Vaccines (2): STI Screening:  Follow up plan: No follow-ups on file.   LABORATORY TESTING:  - Pap smear: {Blank single:19197::"pap done","not applicable","up to date","done elsewhere"}  IMMUNIZATIONS:   - Tdap: Tetanus vaccination status reviewed: {tetanus status:315746}. - Influenza: {Blank single:19197::"Up to date","Administered today","Postponed to flu season","Refused","Given elsewhere"} - Pneumovax: {Blank single:19197::"Up to date","Administered  today","Not applicable","Refused","Given elsewhere"} - Prevnar: {Blank single:19197::"Up to date","Administered today","Not applicable","Refused","Given elsewhere"} - Zostavax vaccine: {Blank single:19197::"Up to date","Administered today","Not applicable","Refused","Given elsewhere"}  SCREENING: -Mammogram: {Blank single:19197::"Up to date","Ordered today","Not applicable","Refused","Done elsewhere"}  - Colonoscopy: {Blank single:19197::"Up to date","Ordered today","Not applicable","Refused","Done elsewhere"}  - Bone Density: {Blank single:19197::"Up to date","Ordered today","Not applicable","Refused","Done elsewhere"}  -Hearing Test: {Blank single:19197::"Up to date","Ordered today","Not applicable","Refused","Done elsewhere"}  -Spirometry: {Blank single:19197::"Up to date","Ordered today","Not applicable","Refused","Done elsewhere"}   PATIENT COUNSELING:   Advised to take 1 mg of folate supplement per day if capable of pregnancy.   Sexuality: Discussed sexually transmitted diseases, partner selection, use of condoms, avoidance of unintended pregnancy  and contraceptive alternatives.   Advised to avoid cigarette smoking.  I discussed with the patient that most people either abstain from alcohol or drink within safe limits (<=14/week and <=4 drinks/occasion for males, <=7/weeks and <= 3 drinks/occasion for females) and that the risk for alcohol disorders and other health effects rises proportionally with the number of drinks per week and how often a drinker exceeds daily limits.  Discussed cessation/primary prevention of drug use and availability of treatment for abuse.   Diet: Encouraged to adjust caloric intake to maintain  or achieve ideal body weight, to reduce intake of dietary saturated fat and total fat, to limit sodium intake by avoiding high sodium foods and not adding table salt, and to maintain adequate dietary potassium and calcium preferably from fresh fruits, vegetables, and  low-fat dairy products.    stressed the importance of regular exercise  Injury prevention: Discussed safety belts, safety helmets, smoke detector, smoking near bedding or upholstery.   Dental health: Discussed importance of regular tooth brushing, flossing, and dental visits.    NEXT PREVENTATIVE PHYSICAL DUE IN 1 YEAR. No follow-ups on file.

## 2019-07-01 NOTE — Progress Notes (Signed)
LMP  (LMP Unknown)    Subjective:    Patient ID: Sandra Stout, female    DOB: 07-02-1945, 74 y.o.   MRN: [id number]  HPI: Sandra Stout is a 74 y.o. female  Chief Complaint  Patient presents with  . Urinary Tract Infection    X 1 week   Has not been feeling well. Was around her daughter who was sick with COVID symptoms. She started feeling sick a couple of days later with cough, headache, nausea. She felt like she may have a sinus infection. She was concerned that she had COVID. She went to be tested and was negative.   UPPER RESPIRATORY TRACT INFECTION Duration: about a week and a half Worst symptom: sinus drainage and coughing Fever: no Cough: yes Shortness of breath: yes Wheezing: yes Chest pain: no Chest tightness: yes Chest congestion: yes Nasal congestion: yes Runny nose: yes Post nasal drip: yes Sneezing: no Sore throat: no Swollen glands: yes Sinus pressure: yes Headache: yes Face pain: yes Toothache: yes Ear pain: yes  Ear pressure: no  Eyes red/itching:no Eye drainage/crusting: no  Vomiting: no Rash: no Fatigue: yes Sick contacts: yes Strep contacts: no  Context: stable Recurrent sinusitis: no Relief with OTC cold/cough medications: no  Treatments attempted: none    Relevant past medical, surgical, family and social history reviewed and updated as indicated. Interim medical history since our last visit reviewed. Allergies and medications reviewed and updated.  Review of Systems  Constitutional: Positive for fatigue. Negative for activity change, appetite change, chills, diaphoresis, fever and unexpected weight change.  HENT: Positive for congestion, postnasal drip, rhinorrhea, sinus pressure and sinus pain. Negative for dental problem, drooling, ear discharge, ear pain, facial swelling, hearing loss, mouth sores, nosebleeds, sneezing, sore throat, tinnitus, trouble swallowing and voice change.   Eyes: Negative.   Respiratory: Positive for cough and  shortness of breath. Negative for apnea, choking, chest tightness, wheezing and stridor.   Cardiovascular: Negative.   Gastrointestinal: Negative.   Musculoskeletal: Negative.   Psychiatric/Behavioral: Negative.     Per HPI unless specifically indicated above     Objective:    LMP  (LMP Unknown)   Wt Readings from Last 3 Encounters:  10/15/18 109 lb (49.4 kg)  10/06/18 102 lb (46.3 kg)  02/03/18 115 lb (52.2 kg)    Physical Exam Vitals and nursing note reviewed.  Pulmonary:     Effort: Pulmonary effort is normal. No respiratory distress.     Comments: Speaking in full sentences Neurological:     Mental Status: She is alert.  Psychiatric:        Mood and Affect: Mood normal.        Behavior: Behavior normal.        Thought Content: Thought content normal.        Judgment: Judgment normal.     Results for orders placed or performed during the hospital encounter of 10/06/18  SARS Coronavirus 2 [facility] order, Performed in [facility] hospital lab) Nasopharyngeal Nasopharyngeal Swab   Specimen: Nasopharyngeal Swab  Result Value Ref Range   SARS Coronavirus 2 NEGATIVE NEGATIVE  CBC with Differential  Result Value Ref Range   WBC 9.9 4.0 - 10.5 K/uL   RBC 4.13 3.87 - 5.11 MIL/uL   Hemoglobin 12.8 12.0 - 15.0 g/dL   HCT 94.1 74.0 - 81.4 %   MCV 93.5 80.0 - 100.0 fL   MCH 31.0 26.0 - 34.0 pg   MCHC 33.2 30.0 - 36.0 g/dL  RDW 13.4 11.5 - 15.5 %   Platelets 299 150 - 400 K/uL   nRBC 0.0 0.0 - 0.2 %   Neutrophils Relative % 72 %   Neutro Abs 7.1 1.7 - 7.7 K/uL   Lymphocytes Relative 13 %   Lymphs Abs 1.3 0.7 - 4.0 K/uL   Monocytes Relative 14 %   Monocytes Absolute 1.3 (H) 0.1 - 1.0 K/uL   Eosinophils Relative 0 %   Eosinophils Absolute 0.0 0.0 - 0.5 K/uL   Basophils Relative 0 %   Basophils Absolute 0.0 0.0 - 0.1 K/uL   Immature Granulocytes 1 %   Abs Immature Granulocytes 0.05 0.00 - 0.07 K/uL  Basic metabolic panel  Result Value Ref Range   Sodium 134 (L) 135  - 145 mmol/L   Potassium 3.5 3.5 - 5.1 mmol/L   Chloride 98 98 - 111 mmol/L   CO2 25 22 - 32 mmol/L   Glucose, Bld 117 (H) 70 - 99 mg/dL   BUN 19 8 - 23 mg/dL   Creatinine, Ser 2.03 0.44 - 1.00 mg/dL   Calcium 9.0 8.9 - 55.9 mg/dL   GFR calc non Af Amer >60 >60 mL/min   GFR calc Af Amer >60 >60 mL/min   Anion gap 11 5 - 15  Brain natriuretic peptide  Result Value Ref Range   B Natriuretic Peptide 78.0 0.0 - 100.0 pg/mL  Procalcitonin - Baseline  Result Value Ref Range   Procalcitonin 0.32 ng/mL  Procalcitonin  Result Value Ref Range   Procalcitonin 0.25 ng/mL  Basic metabolic panel  Result Value Ref Range   Sodium 136 135 - 145 mmol/L   Potassium 3.4 (L) 3.5 - 5.1 mmol/L   Chloride 104 98 - 111 mmol/L   CO2 25 22 - 32 mmol/L   Glucose, Bld 204 (H) 70 - 99 mg/dL   BUN 17 8 - 23 mg/dL   Creatinine, Ser 7.41 0.44 - 1.00 mg/dL   Calcium 9.1 8.9 - 63.8 mg/dL   GFR calc non Af Amer >60 >60 mL/min   GFR calc Af Amer >60 >60 mL/min   Anion gap 7 5 - 15  CBC  Result Value Ref Range   WBC 7.5 4.0 - 10.5 K/uL   RBC 4.21 3.87 - 5.11 MIL/uL   Hemoglobin 13.2 12.0 - 15.0 g/dL   HCT 45.3 64.6 - 80.3 %   MCV 93.8 80.0 - 100.0 fL   MCH 31.4 26.0 - 34.0 pg   MCHC 33.4 30.0 - 36.0 g/dL   RDW 21.2 24.8 - 25.0 %   Platelets 295 150 - 400 K/uL   nRBC 0.0 0.0 - 0.2 %  Magnesium  Result Value Ref Range   Magnesium 2.1 1.7 - 2.4 mg/dL  Troponin I (High Sensitivity)  Result Value Ref Range   Troponin I (High Sensitivity) 8 <18 ng/L  Troponin I (High Sensitivity)  Result Value Ref Range   Troponin I (High Sensitivity) 9 <18 ng/L      Assessment & Plan:   Problem List Items Addressed This Visit    None    Visit Diagnoses    Acute non-recurrent maxillary sinusitis    -  Primary   Will treat with augmentin and a burst of prednisone. Call with any concerns or if not getting better. Continue to monitor.    Relevant Medications   amoxicillin-clavulanate (AUGMENTIN) 875-125 MG tablet     predniSONE (DELTASONE) 50 MG tablet       Follow up plan:  No follow-ups on file.   . This visit was completed via telephone due to the restrictions of the COVID-19 pandemic. All issues as above were discussed and addressed but no physical exam was performed. If it was felt that the patient should be evaluated in the office, they were directed there. The patient verbally consented to this visit. Patient was unable to complete an audio/visual visit due to Lack of equipment. Due to the catastrophic nature of the COVID-19 pandemic, this visit was done through audio contact only. . Location of the patient: home . Location of the provider: work . Those involved with this call:  . Provider: Park Liter, DO . CMA: Tiffany Reel, CMA . Front Desk/Registration: Don Perking  . Time spent on call: 15 minutes on the phone discussing health concerns. 23 minutes total spent in review of patient's record and preparation of their chart.

## 2019-07-12 ENCOUNTER — Telehealth: Payer: Self-pay | Admitting: Family Medicine

## 2019-07-12 NOTE — Telephone Encounter (Signed)
appt

## 2019-07-12 NOTE — Telephone Encounter (Signed)
Routing to provider  

## 2019-07-12 NOTE — Telephone Encounter (Signed)
Copied from CRM [id number]. Topic: General - Other >> Jul 12, 2019  4:04 PM Dalphine Handing A wrote: Patient called to inform Dr .Laural Benes that she has finished antibiotics and she still has a lot of mucus and a cough and she would like to know if Dr. Laural Benes can call in anything stronger.

## 2019-07-13 ENCOUNTER — Encounter: Payer: Self-pay | Admitting: Family Medicine

## 2019-07-13 ENCOUNTER — Ambulatory Visit (INDEPENDENT_AMBULATORY_CARE_PROVIDER_SITE_OTHER): Payer: Medicare Other | Admitting: Family Medicine

## 2019-07-13 DIAGNOSIS — B370 Candidal stomatitis: Secondary | ICD-10-CM

## 2019-07-13 DIAGNOSIS — R0602 Shortness of breath: Secondary | ICD-10-CM

## 2019-07-13 DIAGNOSIS — F4321 Adjustment disorder with depressed mood: Secondary | ICD-10-CM | POA: Diagnosis not present

## 2019-07-13 MED ORDER — PREDNISONE 10 MG PO TABS: ORAL_TABLET | ORAL | 0 refills | Status: DC

## 2019-07-13 MED ORDER — FLUCONAZOLE 150 MG PO TABS: 150.0000 mg | ORAL_TABLET | Freq: Every day | ORAL | 0 refills | Status: DC

## 2019-07-13 NOTE — Telephone Encounter (Signed)
Pt has virtual apt on 07/13/2019

## 2019-07-13 NOTE — Progress Notes (Signed)
LMP  (LMP Unknown)    Subjective:    Patient ID: Sandra Stout, female    DOB: 1946/02/11, 74 y.o.   MRN: [id number]  HPI: Sandra Stout is a 74 y.o. female  Chief Complaint  Patient presents with  . Cough    with mucous   Just found out her husband passed away, has been very upset. They were separated, but still very tough.   UPPER RESPIRATORY TRACT INFECTION- negative COVID on Monday Duration: about a month Worst symptom: cough and mucus Fever: no Cough: yes Shortness of breath: yes Wheezing: yes Chest pain: no Chest tightness: no Chest congestion: yes Nasal congestion: yes Runny nose: yes Post nasal drip: yes Sneezing: no Sore throat: yes Swollen glands: no Sinus pressure: yes Headache: yes Face pain: no Toothache: no Ear pain: no  Ear pressure: no  Eyes red/itching:no Eye drainage/crusting: no  Vomiting: no Rash: no Fatigue: yes Sick contacts: no Strep contacts: no  Context: stable Recurrent sinusitis: no Relief with OTC cold/cough medications: no  Treatments attempted: anti-histamine and antibiotics   Relevant past medical, surgical, family and social history reviewed and updated as indicated. Interim medical history since our last visit reviewed. Allergies and medications reviewed and updated.  Review of Systems  Constitutional: Negative.   HENT: Negative.   Respiratory: Positive for cough, chest tightness, shortness of breath and wheezing. Negative for apnea, choking and stridor.   Cardiovascular: Negative.   Gastrointestinal: Negative.   Musculoskeletal: Negative.   Skin: Negative.   Neurological: Negative.   Psychiatric/Behavioral: Positive for dysphoric mood. Negative for agitation, behavioral problems, confusion, decreased concentration, hallucinations, self-injury, sleep disturbance and suicidal ideas. The patient is nervous/anxious. The patient is not hyperactive.     Per HPI unless specifically indicated above     Objective:    LMP   (LMP Unknown)   Wt Readings from Last 3 Encounters:  10/15/18 109 lb (49.4 kg)  10/06/18 102 lb (46.3 kg)  02/03/18 115 lb (52.2 kg)    Physical Exam Vitals and nursing note reviewed.  Pulmonary:     Effort: Pulmonary effort is normal. No respiratory distress.     Comments: Speaking in full sentences Neurological:     Mental Status: She is alert.  Psychiatric:        Mood and Affect: Mood normal.        Behavior: Behavior normal.        Thought Content: Thought content normal.        Judgment: Judgment normal.     Results for orders placed or performed during the hospital encounter of 10/06/18  SARS Coronavirus 2 [facility] order, Performed in [facility] hospital lab) Nasopharyngeal Nasopharyngeal Swab   Specimen: Nasopharyngeal Swab  Result Value Ref Range   SARS Coronavirus 2 NEGATIVE NEGATIVE  CBC with Differential  Result Value Ref Range   WBC 9.9 4.0 - 10.5 K/uL   RBC 4.13 3.87 - 5.11 MIL/uL   Hemoglobin 12.8 12.0 - 15.0 g/dL   HCT 81.0 17.5 - 10.2 %   MCV 93.5 80.0 - 100.0 fL   MCH 31.0 26.0 - 34.0 pg   MCHC 33.2 30.0 - 36.0 g/dL   RDW 58.5 27.7 - 82.4 %   Platelets 299 150 - 400 K/uL   nRBC 0.0 0.0 - 0.2 %   Neutrophils Relative % 72 %   Neutro Abs 7.1 1.7 - 7.7 K/uL   Lymphocytes Relative 13 %   Lymphs Abs 1.3 0.7 - 4.0 K/uL  Monocytes Relative 14 %   Monocytes Absolute 1.3 (H) 0.1 - 1.0 K/uL   Eosinophils Relative 0 %   Eosinophils Absolute 0.0 0.0 - 0.5 K/uL   Basophils Relative 0 %   Basophils Absolute 0.0 0.0 - 0.1 K/uL   Immature Granulocytes 1 %   Abs Immature Granulocytes 0.05 0.00 - 0.07 K/uL  Basic metabolic panel  Result Value Ref Range   Sodium 134 (L) 135 - 145 mmol/L   Potassium 3.5 3.5 - 5.1 mmol/L   Chloride 98 98 - 111 mmol/L   CO2 25 22 - 32 mmol/L   Glucose, Bld 117 (H) 70 - 99 mg/dL   BUN 19 8 - 23 mg/dL   Creatinine, Ser 5.27 0.44 - 1.00 mg/dL   Calcium 9.0 8.9 - 78.2 mg/dL   GFR calc non Af Amer >60 >60 mL/min   GFR calc Af  Amer >60 >60 mL/min   Anion gap 11 5 - 15  Brain natriuretic peptide  Result Value Ref Range   B Natriuretic Peptide 78.0 0.0 - 100.0 pg/mL  Procalcitonin - Baseline  Result Value Ref Range   Procalcitonin 0.32 ng/mL  Procalcitonin  Result Value Ref Range   Procalcitonin 0.25 ng/mL  Basic metabolic panel  Result Value Ref Range   Sodium 136 135 - 145 mmol/L   Potassium 3.4 (L) 3.5 - 5.1 mmol/L   Chloride 104 98 - 111 mmol/L   CO2 25 22 - 32 mmol/L   Glucose, Bld 204 (H) 70 - 99 mg/dL   BUN 17 8 - 23 mg/dL   Creatinine, Ser 4.23 0.44 - 1.00 mg/dL   Calcium 9.1 8.9 - 53.6 mg/dL   GFR calc non Af Amer >60 >60 mL/min   GFR calc Af Amer >60 >60 mL/min   Anion gap 7 5 - 15  CBC  Result Value Ref Range   WBC 7.5 4.0 - 10.5 K/uL   RBC 4.21 3.87 - 5.11 MIL/uL   Hemoglobin 13.2 12.0 - 15.0 g/dL   HCT 14.4 31.5 - 40.0 %   MCV 93.8 80.0 - 100.0 fL   MCH 31.4 26.0 - 34.0 pg   MCHC 33.4 30.0 - 36.0 g/dL   RDW 86.7 61.9 - 50.9 %   Platelets 295 150 - 400 K/uL   nRBC 0.0 0.0 - 0.2 %  Magnesium  Result Value Ref Range   Magnesium 2.1 1.7 - 2.4 mg/dL  Troponin I (High Sensitivity)  Result Value Ref Range   Troponin I (High Sensitivity) 8 <18 ng/L  Troponin I (High Sensitivity)  Result Value Ref Range   Troponin I (High Sensitivity) 9 <18 ng/L      Assessment & Plan:   Problem List Items Addressed This Visit    None    Visit Diagnoses    SOB (shortness of breath)    -  Primary   Will get CXR. Will treat with prednisone taper. Just finished augmentin 2 days ago. Call with any conerns or if not getting better.    Relevant Orders   DG Chest 2 View   Grief       Support given. Continue to monitor. Call with any concerns.    Thrush       Will treat with diflucan. Call with any concerns. Continue to monitor.    Relevant Medications   fluconazole (DIFLUCAN) 150 MG tablet       Follow up plan: Return if symptoms worsen or fail to improve.    [name]  This visit was completed via  telephone due to the restrictions of the COVID-19 pandemic. All issues as above were discussed and addressed but no physical exam was performed. If it was felt that the patient should be evaluated in the office, they were directed there. The patient verbally consented to this visit. Patient was unable to complete an audio/visual visit due to Lack of equipment. Due to the catastrophic nature of the COVID-19 pandemic, this visit was done through audio contact only. . Location of the patient: home . Location of the provider: work . Those involved with this call:  . Provider: Park Liter, DO . CMA: Lauretta Grill, RMA . Front Desk/Registration: Don Perking  . Time spent on call: 25 minutes on the phone discussing health concerns. 40 minutes total spent in review of patient's record and preparation of their chart.

## 2019-07-15 ENCOUNTER — Encounter: Payer: Medicare Other | Admitting: Family Medicine

## 2019-07-19 ENCOUNTER — Telehealth: Payer: Self-pay | Admitting: Family Medicine

## 2019-07-19 NOTE — Telephone Encounter (Signed)
Copied from CRM [id number]. Topic: General - Other >> Jul 15, 2019  3:39 PM Sandra Stout wrote: Reason for CRM: Patient is calling back to inform Dr. Laural Benes medication is helping

## 2019-07-19 NOTE — Telephone Encounter (Signed)
Routing to provider  

## 2019-08-09 ENCOUNTER — Ambulatory Visit (INDEPENDENT_AMBULATORY_CARE_PROVIDER_SITE_OTHER): Payer: Medicare Other | Admitting: Family Medicine

## 2019-08-09 ENCOUNTER — Encounter: Payer: Self-pay | Admitting: Family Medicine

## 2019-08-09 ENCOUNTER — Other Ambulatory Visit: Payer: Self-pay

## 2019-08-09 VITALS — BP 139/80 | HR 105 | Temp 98.1°F | Wt 97.0 lb

## 2019-08-09 DIAGNOSIS — Z136 Encounter for screening for cardiovascular disorders: Secondary | ICD-10-CM | POA: Diagnosis not present

## 2019-08-09 DIAGNOSIS — Z13220 Encounter for screening for lipoid disorders: Secondary | ICD-10-CM | POA: Diagnosis not present

## 2019-08-09 DIAGNOSIS — R5382 Chronic fatigue, unspecified: Secondary | ICD-10-CM | POA: Diagnosis not present

## 2019-08-09 DIAGNOSIS — N8110 Cystocele, unspecified: Secondary | ICD-10-CM

## 2019-08-09 DIAGNOSIS — F339 Major depressive disorder, recurrent, unspecified: Secondary | ICD-10-CM | POA: Diagnosis not present

## 2019-08-09 DIAGNOSIS — G8929 Other chronic pain: Secondary | ICD-10-CM

## 2019-08-09 DIAGNOSIS — K649 Unspecified hemorrhoids: Secondary | ICD-10-CM

## 2019-08-09 DIAGNOSIS — R300 Dysuria: Secondary | ICD-10-CM

## 2019-08-09 DIAGNOSIS — M545 Low back pain, unspecified: Secondary | ICD-10-CM

## 2019-08-09 DIAGNOSIS — N898 Other specified noninflammatory disorders of vagina: Secondary | ICD-10-CM

## 2019-08-09 LAB — WET PREP FOR TRICH, YEAST, CLUE
Clue Cell Exam: NEGATIVE
Trichomonas Exam: NEGATIVE
Yeast Exam: NEGATIVE

## 2019-08-09 MED ORDER — LIDOCAINE 4 % EX PTCH: 1.0000 | MEDICATED_PATCH | Freq: Two times a day (BID) | CUTANEOUS | 3 refills | Status: DC

## 2019-08-09 MED ORDER — DULOXETINE HCL 20 MG PO CPEP: 20.0000 mg | ORAL_CAPSULE | Freq: Every day | ORAL | 3 refills | Status: DC

## 2019-08-09 NOTE — Assessment & Plan Note (Signed)
Not under good control. Will start cymbalta and recheck 2 weeks. Call with any concerns.

## 2019-08-09 NOTE — Assessment & Plan Note (Signed)
>>  ASSESSMENT AND PLAN FOR DEPRESSION, RECURRENT (HCC) WRITTEN ON 08/09/2019  3:07 PM BY JOHNSON, MEGAN P, DO  Not under good control. Will start cymbalta and recheck 2 weeks. Call with any concerns.

## 2019-08-09 NOTE — Assessment & Plan Note (Signed)
Will get her x-ray of her back and start lidocaine patch and cymbalta. Recheck 2 weeks. Call with any concerns.
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
## 2019-08-10 ENCOUNTER — Ambulatory Visit: Payer: Medicare Other | Admitting: Family Medicine

## 2019-08-10 LAB — COMPREHENSIVE METABOLIC PANEL
ALT: 34 IU/L — ABNORMAL HIGH (ref 0–32)
AST: 47 IU/L — ABNORMAL HIGH (ref 0–40)
Albumin/Globulin Ratio: 2.1 (ref 1.2–2.2)
Albumin: 4.5 g/dL (ref 3.7–4.7)
Alkaline Phosphatase: 136 IU/L — ABNORMAL HIGH (ref 48–121)
BUN/Creatinine Ratio: 13 (ref 12–28)
BUN: 10 mg/dL (ref 8–27)
Bilirubin Total: 0.3 mg/dL (ref 0.0–1.2)
CO2: 22 mmol/L (ref 20–29)
Calcium: 10.4 mg/dL — ABNORMAL HIGH (ref 8.7–10.3)
Chloride: 102 mmol/L (ref 96–106)
Creatinine, Ser: 0.8 mg/dL (ref 0.57–1.00)
GFR calc Af Amer: 83 mL/min/{1.73_m2} (ref >59)
GFR calc non Af Amer: 72 mL/min/{1.73_m2} (ref >59)
Globulin, Total: 2.1 g/dL (ref 1.5–4.5)
Glucose: 90 mg/dL (ref 65–99)
Potassium: 4.3 mmol/L (ref 3.5–5.2)
Sodium: 142 mmol/L (ref 134–144)
Total Protein: 6.6 g/dL (ref 6.0–8.5)

## 2019-08-10 LAB — CBC WITH DIFFERENTIAL/PLATELET
Basophils Absolute: 0 10*3/uL (ref 0.0–0.2)
Basos: 1 %
EOS (ABSOLUTE): 0.1 10*3/uL (ref 0.0–0.4)
Eos: 1 %
Hematocrit: 44.1 % (ref 34.0–46.6)
Hemoglobin: 14.5 g/dL (ref 11.1–15.9)
Immature Grans (Abs): 0 10*3/uL (ref 0.0–0.1)
Immature Granulocytes: 0 %
Lymphocytes Absolute: 2.5 10*3/uL (ref 0.7–3.1)
Lymphs: 42 %
MCH: 31.7 pg (ref 26.6–33.0)
MCHC: 32.9 g/dL (ref 31.5–35.7)
MCV: 97 fL (ref 79–97)
Monocytes Absolute: 0.5 10*3/uL (ref 0.1–0.9)
Monocytes: 9 %
Neutrophils Absolute: 2.7 10*3/uL (ref 1.4–7.0)
Neutrophils: 47 %
Platelets: 384 10*3/uL (ref 150–450)
RBC: 4.57 x10E6/uL (ref 3.77–5.28)
RDW: 12.9 % (ref 11.7–15.4)
WBC: 5.8 10*3/uL (ref 3.4–10.8)

## 2019-08-10 LAB — LIPID PANEL W/O CHOL/HDL RATIO
Cholesterol, Total: 257 mg/dL — ABNORMAL HIGH (ref 100–199)
HDL: 70 mg/dL (ref >39)
LDL Chol Calc (NIH): 165 mg/dL — ABNORMAL HIGH (ref 0–99)
Triglycerides: 124 mg/dL (ref 0–149)
VLDL Cholesterol Cal: 22 mg/dL (ref 5–40)

## 2019-08-10 LAB — TSH: TSH: 2.13 u[IU]/mL (ref 0.450–4.500)

## 2019-08-11 ENCOUNTER — Other Ambulatory Visit: Payer: Self-pay

## 2019-08-11 ENCOUNTER — Ambulatory Visit
Admission: RE | Admit: 2019-08-11 | Discharge: 2019-08-11 | Disposition: A | Discharge status: Home / Self Care | Payer: Medicare Other | Attending: Family Medicine | Admitting: Family Medicine

## 2019-08-11 ENCOUNTER — Ambulatory Visit
Admission: RE | Admit: 2019-08-11 | Discharge: 2019-08-11 | Disposition: A | Discharge status: Home / Self Care | Payer: Medicare Other | Source: Ambulatory Visit | Attending: Family Medicine | Admitting: Family Medicine

## 2019-08-11 DIAGNOSIS — G8929 Other chronic pain: Secondary | ICD-10-CM | POA: Insufficient documentation

## 2019-08-11 DIAGNOSIS — M545 Low back pain: Secondary | ICD-10-CM | POA: Insufficient documentation

## 2019-08-11 LAB — UA/M W/RFLX CULTURE, ROUTINE
Bilirubin, UA: NEGATIVE
Glucose, UA: NEGATIVE
Ketones, UA: NEGATIVE
Nitrite, UA: NEGATIVE
Protein,UA: NEGATIVE
RBC, UA: NEGATIVE
Specific Gravity, UA: 1.015 (ref 1.005–1.030)
Urobilinogen, Ur: 0.2 mg/dL (ref 0.2–1.0)
pH, UA: 7 (ref 5.0–7.5)

## 2019-08-11 LAB — URINE CULTURE, REFLEX

## 2019-08-11 LAB — MICROSCOPIC EXAMINATION
Bacteria, UA: NONE SEEN
RBC, Urine: NONE SEEN /hpf (ref 0–2)

## 2019-08-18 ENCOUNTER — Encounter: Payer: Self-pay | Admitting: *Deleted

## 2019-08-24 ENCOUNTER — Ambulatory Visit: Payer: Medicare Other | Admitting: Family Medicine

## 2019-09-08 ENCOUNTER — Ambulatory Visit: Payer: Self-pay

## 2019-09-08 NOTE — Chronic Care Management (AMB) (Signed)
  Care Management   Follow Up Note   09/08/2019 Name: ZENNA TRAISTER MRN: [id number] DOB: 07-14-1945  Referred by: [name], DO Reason for referral : Care Coordination   KRYSTYN PICKING is a 74 y.o. year old female who is a primary care patient of [name], DO. The care management team was consulted for assistance with care management and care coordination needs.    Review of patient status, including review of consultants reports, relevant laboratory and other test results, and collaboration with appropriate care team members and the patient's provider was performed as part of comprehensive patient evaluation and provision of chronic care management services.    LCSW completed CCM outreach attempt today but was unable to reach patient successfully. A HIPPA compliant voice message was left encouraging patient to return call once available. LCSW rescheduled CCM SW appointment as well.  A HIPPA compliant phone message was left for the patient providing contact information and requesting a return call.   Dickie La, BSW, MSW, LCSW [facility] [place]  [facility] [email or web address] Phone: [phone]

## 2019-09-09 ENCOUNTER — Ambulatory Visit (INDEPENDENT_AMBULATORY_CARE_PROVIDER_SITE_OTHER): Payer: Medicare Other | Admitting: Family Medicine

## 2019-09-09 ENCOUNTER — Other Ambulatory Visit: Payer: Self-pay

## 2019-09-09 ENCOUNTER — Encounter: Payer: Self-pay | Admitting: Family Medicine

## 2019-09-09 DIAGNOSIS — F339 Major depressive disorder, recurrent, unspecified: Secondary | ICD-10-CM | POA: Diagnosis not present

## 2019-09-09 MED ORDER — DULOXETINE HCL 40 MG PO CPEP: 40.0000 mg | ORAL_CAPSULE | Freq: Every day | ORAL | 2 refills | Status: DC

## 2019-09-09 MED ORDER — NAPROXEN 500 MG PO TABS: 500.0000 mg | ORAL_TABLET | Freq: Two times a day (BID) | ORAL | 3 refills | Status: DC

## 2019-09-09 NOTE — Progress Notes (Signed)
BP 119/77   Pulse 96   Temp 97.8 F (36.6 C) (Temporal)   Wt 99 lb (44.9 kg)   LMP  (LMP Unknown)   SpO2 96%   BMI 18.11 kg/m    Subjective:    Patient ID: Sandra Stout, female    DOB: 1945/08/03, 74 y.o.   MRN: [id number]  HPI: Sandra Stout is a 74 y.o. female  Chief Complaint  Patient presents with  . Back Pain  . Depression  . Medication Refill    naproxen, cymbalta   DEPRESSION- notes that she felt better for the first couple of weeks on the medicine. She notes that she isn't sure if it helped in the 2nd couple of weeks.  Mood status: slightly better Satisfied with current treatment?: no Symptom severity: severe  Duration of current treatment : 1 month Side effects: no Medication compliance: good compliance Psychotherapy/counseling: no  Depressed mood: yes Anxious mood: yes Anhedonia: no Significant weight loss or gain: no Insomnia: yes hard to fall asleep Fatigue: yes Feelings of worthlessness or guilt: yes Impaired concentration/indecisiveness: yes Suicidal ideations: no Hopelessness: yes Crying spells: yes  Patient refused PHQ9 today Depression screen [facility] 2/9 08/09/2019 07/13/2019 10/15/2018 02/03/2018 08/17/2014  Decreased Interest 3 1 3 3 3   Down, Depressed, Hopeless 3 1 3 3 3   PHQ - 2 Score 6 2 6 6 6   Altered sleeping 2 1 2 3  0  Tired, decreased energy 3 3 3 3 3   Change in appetite 2 1 3 1  0  Feeling bad or failure about yourself  3 1 3 3  0  Trouble concentrating 3 3 3 3 2   Moving slowly or fidgety/restless 3 1 3 3 3   Suicidal thoughts 0 0 0 0 0  PHQ-9 Score 22 12 23 22 14   Difficult doing work/chores - Somewhat difficult Extremely dIfficult Extremely dIfficult Very difficult   BACK PAIN- doing a little better on the cymbalta, feeling a bit more like herself  Relevant past medical, surgical, family and social history reviewed and updated as indicated. Interim medical history since our last visit reviewed. Allergies and medications reviewed and  updated.  Review of Systems  Constitutional: Negative.   Respiratory: Negative.   Cardiovascular: Negative.   Gastrointestinal: Negative.   Musculoskeletal: Positive for back pain, gait problem and myalgias. Negative for arthralgias, joint swelling, neck pain and neck stiffness.  Skin: Negative.   Neurological: Positive for weakness. Negative for dizziness, tremors, seizures, syncope, facial asymmetry, speech difficulty, light-headedness, numbness and headaches.  Psychiatric/Behavioral: Positive for dysphoric mood. Negative for agitation, behavioral problems, confusion, decreased concentration, hallucinations, self-injury, sleep disturbance and suicidal ideas. The patient is nervous/anxious. The patient is not hyperactive.     Per HPI unless specifically indicated above     Objective:    BP 119/77   Pulse 96   Temp 97.8 F (36.6 C) (Temporal)   Wt 99 lb (44.9 kg)   LMP  (LMP Unknown)   SpO2 96%   BMI 18.11 kg/m   Wt Readings from Last 3 Encounters:  09/09/19 99 lb (44.9 kg)  08/09/19 97 lb (44 kg)  10/15/18 109 lb (49.4 kg)    Physical Exam Vitals and nursing note reviewed.  Constitutional:      General: She is not in acute distress.    Appearance: Normal appearance. She is not ill-appearing, toxic-appearing or diaphoretic.  HENT:     Head: Normocephalic and atraumatic.     Right Ear: External ear normal.  Left Ear: External ear normal.     Nose: Nose normal.     Mouth/Throat:     Mouth: Mucous membranes are moist.     Pharynx: Oropharynx is clear.  Eyes:     General: No scleral icterus.       Right eye: No discharge.        Left eye: No discharge.     Extraocular Movements: Extraocular movements intact.     Conjunctiva/sclera: Conjunctivae normal.     Pupils: Pupils are equal, round, and reactive to light.  Cardiovascular:     Rate and Rhythm: Normal rate and regular rhythm.     Pulses: Normal pulses.     Heart sounds: Normal heart sounds. No murmur heard.    No friction rub. No gallop.   Pulmonary:     Effort: Pulmonary effort is normal. No respiratory distress.     Breath sounds: Normal breath sounds. No stridor. No wheezing, rhonchi or rales.  Chest:     Chest wall: No tenderness.  Musculoskeletal:        General: Normal range of motion.     Cervical back: Normal range of motion and neck supple.  Skin:    General: Skin is warm and dry.     Capillary Refill: Capillary refill takes less than 2 seconds.     Coloration: Skin is not jaundiced or pale.     Findings: No bruising, erythema, lesion or rash.  Neurological:     General: No focal deficit present.     Mental Status: She is alert and oriented to person, place, and time. Mental status is at baseline.  Psychiatric:        Mood and Affect: Mood is anxious.        Behavior: Behavior normal.        Thought Content: Thought content normal.        Judgment: Judgment normal.     Results for orders placed or performed in visit on 08/09/19  WET PREP FOR TRICH, YEAST, CLUE   Specimen: Vaginal; Sterile Swab   Sterile Swab  Result Value Ref Range   Trichomonas Exam Negative Negative   Yeast Exam Negative Negative   Clue Cell Exam Negative Negative  Microscopic Examination   Urine  Result Value Ref Range   WBC, UA 0-5 0 - 5 /hpf   RBC None seen 0 - 2 /hpf   Epithelial Cells (non renal) 0-10 0 - 10 /hpf   Bacteria, UA None seen None seen/Few  Urine Culture, Reflex   Urine  Result Value Ref Range   Urine Culture, Routine Final report    Organism ID, Bacteria Comment   UA/M w/rflx Culture, Routine   Specimen: Urine   Urine  Result Value Ref Range   Specific Gravity, UA 1.015 1.005 - 1.030   pH, UA 7.0 5.0 - 7.5   Color, UA Yellow Yellow   Appearance Ur Clear Clear   Leukocytes,UA Trace (A) Negative   Protein,UA Negative Negative/Trace   Glucose, UA Negative Negative   Ketones, UA Negative Negative   RBC, UA Negative Negative   Bilirubin, UA Negative Negative    Urobilinogen, Ur 0.2 0.2 - 1.0 mg/dL   Nitrite, UA Negative Negative   Microscopic Examination See below:    Urinalysis Reflex Comment   CBC with Differential/Platelet  Result Value Ref Range   WBC 5.8 3.4 - 10.8 x10E3/uL   RBC 4.57 3.77 - 5.28 x10E6/uL   Hemoglobin 14.5 11.1 - 15.9  g/dL   Hematocrit 34.2 87.6 - 46.6 %   MCV 97 79 - 97 fL   MCH 31.7 26.6 - 33.0 pg   MCHC 32.9 31 - 35 g/dL   RDW 81.1 57.2 - 62.0 %   Platelets 384 150 - 450 x10E3/uL   Neutrophils 47 Not Estab. %   Lymphs 42 Not Estab. %   Monocytes 9 Not Estab. %   Eos 1 Not Estab. %   Basos 1 Not Estab. %   Neutrophils Absolute 2.7 1 - 7 x10E3/uL   Lymphocytes Absolute 2.5 0 - 3 x10E3/uL   Monocytes Absolute 0.5 0 - 0 x10E3/uL   EOS (ABSOLUTE) 0.1 0.0 - 0.4 x10E3/uL   Basophils Absolute 0.0 0 - 0 x10E3/uL   Immature Granulocytes 0 Not Estab. %   Immature Grans (Abs) 0.0 0.0 - 0.1 x10E3/uL  Comprehensive metabolic panel  Result Value Ref Range   Glucose 90 65 - 99 mg/dL   BUN 10 8 - 27 mg/dL   Creatinine, Ser 3.55 0.57 - 1.00 mg/dL   GFR calc non Af Amer 72 >59 mL/min/1.73   GFR calc Af Amer 83 >59 mL/min/1.73   BUN/Creatinine Ratio 13 12 - 28   Sodium 142 134 - 144 mmol/L   Potassium 4.3 3.5 - 5.2 mmol/L   Chloride 102 96 - 106 mmol/L   CO2 22 20 - 29 mmol/L   Calcium 10.4 (H) 8.7 - 10.3 mg/dL   Total Protein 6.6 6.0 - 8.5 g/dL   Albumin 4.5 3.7 - 4.7 g/dL   Globulin, Total 2.1 1.5 - 4.5 g/dL   Albumin/Globulin Ratio 2.1 1.2 - 2.2   Bilirubin Total 0.3 0.0 - 1.2 mg/dL   Alkaline Phosphatase 136 (H) 48 - 121 IU/L   AST 47 (H) 0 - 40 IU/L   ALT 34 (H) 0 - 32 IU/L  Lipid Panel w/o Chol/HDL Ratio  Result Value Ref Range   Cholesterol, Total 257 (H) 100 - 199 mg/dL   Triglycerides 974 0 - 149 mg/dL   HDL 70 >16 mg/dL   VLDL Cholesterol Cal 22 5 - 40 mg/dL   LDL Chol Calc (NIH) 384 (H) 0 - 99 mg/dL  TSH  Result Value Ref Range   TSH 2.130 0.450 - 4.500 uIU/mL      Assessment & Plan:   Problem  List Items Addressed This Visit      Other   Depression, recurrent (HCC)    Doing better on the cymabalta. Will increase to 40mg  and rechcek 1 month. Call with any concerns.          Follow up plan: Return in about 4 weeks (around 10/07/2019).

## 2019-09-09 NOTE — Assessment & Plan Note (Signed)
>>  ASSESSMENT AND PLAN FOR DEPRESSION, RECURRENT (HCC) WRITTEN ON 09/09/2019  1:54 PM BY JOHNSON, MEGAN P, DO  Doing better on the cymabalta. Will increase to 40mg  and rechcek 1 month. Call with any concerns.

## 2019-09-09 NOTE — Assessment & Plan Note (Signed)
Doing better on the cymabalta. Will increase to 40mg  and rechcek 1 month. Call with any concerns.

## 2019-10-27 ENCOUNTER — Telehealth: Payer: Self-pay | Admitting: Family Medicine

## 2019-10-27 NOTE — Telephone Encounter (Signed)
Copied from CRM [id number]. Topic: General - Inquiry >> Oct 27, 2019 10:49 AM Adrian Prince D wrote: Reason for CRM: Patient would like to know if she should get the Covid Vaccine. She is a bit concerned and wants to make sure it is okay with her doctor.Please advise the patient

## 2019-10-27 NOTE — Telephone Encounter (Signed)
Pt aware of Dr. Johnson's message.  

## 2019-10-27 NOTE — Telephone Encounter (Signed)
Absolutely! If she'd like to talk to me about the options, let me know, but it would be really bad if she caught it and I completely recommend it.

## 2019-11-12 ENCOUNTER — Telehealth: Payer: Self-pay

## 2019-11-15 ENCOUNTER — Telehealth: Payer: Self-pay | Admitting: Licensed Clinical Social Worker

## 2019-11-15 NOTE — Telephone Encounter (Signed)
°  Chronic Care Management    Clinical Social Work General Follow Up Note  11/15/2019 Name: Sandra Stout MRN: [id number] DOB: Jul 22, 1945  Sandra Stout is a 74 y.o. year old female who is a primary care patient of Sandra Carrow, DO. The CCM team was consulted for assistance with Mental Health Counseling and Resources.   Review of patient status, including review of consultants reports, relevant laboratory and other test results, and collaboration with appropriate care team members and the patient's provider was performed as part of comprehensive patient evaluation and provision of chronic care management services.    LCSW completed CCM outreach attempt today but was unable to reach patient successfully. A HIPPA compliant voice message was left encouraging patient to return call once available. LCSW will ask Scheduling Care Guide to reschedule CCM SW appointment with patient as well.   Outpatient Encounter Medications as of 11/15/2019  Medication Sig   ALPRAZolam (XANAX) 0.5 MG tablet Take 0.5 mg by mouth 2 (two) times daily.    DULoxetine 40 MG CPEP Take 40 mg by mouth daily.   Lidocaine 4 % PTCH Apply 1 patch topically every 12 (twelve) hours.   methylphenidate (RITALIN) 20 MG tablet Take 20 mg by mouth 4 (four) times daily.    naproxen (NAPROSYN) 500 MG tablet Take 1 tablet (500 mg total) by mouth 2 (two) times daily with a meal.   Omega-3 Fatty Acids (FISH OIL) 1000 MG CAPS Take by mouth.   Vitamins-Lipotropics (BALANCED B-50) TABS Take by mouth.   No facility-administered encounter medications on file as of 11/15/2019.    Follow Up Plan: Scheduling Care Guide will reach out to patient to reschedule appointment.   Sandra Stout, BSW, MSW, LCSW [facility] [place]   [facility] [email or web address] Phone: [phone]

## 2019-11-17 NOTE — Telephone Encounter (Signed)
Pt has been r/s  

## 2019-11-29 ENCOUNTER — Ambulatory Visit: Payer: Medicare Other

## 2019-11-30 ENCOUNTER — Encounter: Payer: Self-pay | Admitting: Nurse Practitioner

## 2019-11-30 ENCOUNTER — Ambulatory Visit (INDEPENDENT_AMBULATORY_CARE_PROVIDER_SITE_OTHER): Payer: Medicare Other | Admitting: Nurse Practitioner

## 2019-11-30 VITALS — Ht 62.0 in

## 2019-11-30 DIAGNOSIS — F339 Major depressive disorder, recurrent, unspecified: Secondary | ICD-10-CM | POA: Diagnosis not present

## 2019-11-30 MED ORDER — DULOXETINE HCL 60 MG PO CPEP: 60.0000 mg | ORAL_CAPSULE | Freq: Every day | ORAL | 3 refills | Status: DC

## 2019-11-30 NOTE — Progress Notes (Signed)
Ht 5\' 2"  (1.575 m)    LMP  (LMP Unknown)    BMI 18.11 kg/m    Subjective:    Patient ID: , female    DOB: [birth year], 74 y.o.   MRN: [id number]  HPI: Sandra Stout is a 74 y.o. female presenting with exacerbated mood.  Chief Complaint  Patient presents with   Depression    (pt that lost her only brother in law and her grandson was killed and she is having a really rough time)   DEPRESSION Mood status: uncontrolled Satisfied with current treatment?: no Symptom severity: severe  Duration of current treatment : chronic Side effects: no Medication compliance: excellent compliance Psychotherapy/counseling: yes Previous psychiatric medications: cymbalta Depressed mood: yes Anxious mood: yes Anhedonia: yes Significant weight loss or gain: no Insomnia: no  Fatigue: yes Feelings of worthlessness or guilt: yes Impaired concentration/indecisiveness: yes Suicidal ideations: no Hopelessness: no Crying spells: yes Depression screen [facility] 2/9 11/30/2019 08/09/2019 07/13/2019 10/15/2018 02/03/2018  Decreased Interest 2 3 1 3 3   Down, Depressed, Hopeless 3 3 1 3 3   PHQ - 2 Score 5 6 2 6 6   Altered sleeping 2 2 1 2 3   Tired, decreased energy 1 3 3 3 3   Change in appetite 1 2 1 3 1   Feeling bad or failure about yourself  1 3 1 3 3   Trouble concentrating 2 3 3 3 3   Moving slowly or fidgety/restless 0 3 1 3 3   Suicidal thoughts 0 0 0 0 0  PHQ-9 Score 12 22 12 23 22   Difficult doing work/chores Not difficult at all - Somewhat difficult Extremely dIfficult Extremely dIfficult   No Known Allergies  Outpatient Encounter Medications as of 11/30/2019  Medication Sig   ALPRAZolam (XANAX) 0.5 MG tablet Take 0.5 mg by mouth 2 (two) times daily.    DULoxetine 40 MG CPEP Take 40 mg by mouth daily.   Lidocaine 4 % PTCH Apply 1 patch topically every 12 (twelve) hours.   methylphenidate (RITALIN) 20 MG tablet Take 20 mg by mouth 4 (four) times daily.    naproxen (NAPROSYN) 500 MG  tablet Take 1 tablet (500 mg total) by mouth 2 (two) times daily with a meal.   Omega-3 Fatty Acids (FISH OIL) 1000 MG CAPS Take by mouth.   Vitamins-Lipotropics (BALANCED B-50) TABS Take by mouth.   No facility-administered encounter medications on file as of 11/30/2019.   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 Interim medical history since our last visit reviewed.  Review of Systems  Constitutional: Positive for activity change, appetite change and fatigue. Negative for diaphoresis and fever.  Gastrointestinal: Negative.   Musculoskeletal: Negative.   Skin: Negative.   Neurological: Negative.   Psychiatric/Behavioral: Positive for decreased concentration and sleep disturbance. Negative for agitation, behavioral problems, confusion, hallucinations, self-injury and suicidal ideas. The patient is nervous/anxious. The  patient is not hyperactive.     Per HPI unless specifically indicated above     Objective:    Ht 5\' 2"  (1.575 m)    LMP  (LMP Unknown)    BMI 18.11 kg/m   Wt Readings from Last 3 Encounters:  09/09/19 99 lb (44.9 kg)  08/09/19 97 lb (44 kg)  10/15/18 109 lb (49.4 kg)    Physical Exam Physical exam unable to be performed due to lack of equipment.    Assessment & Plan:   Problem List Items Addressed This Visit    None       Follow up plan: No follow-ups on  file.  This visit was completed via telephone due to the restrictions of the COVID-19 pandemic. All issues as above were discussed and addressed but no physical exam was performed. If it was felt that the patient should be evaluated in the office, they were directed there. The patient verbally consented to this visit. Patient was unable to complete an audio/visual visit due to Lack of equipment.  Location of the patient: home  Location of the provider: work  Those involved with this call:   Provider: 10/17/18, DNP  CMA: Mardene Celeste, CMA  Front Desk/Registration: Laurian Brim   Time spent on call: 15 minutes on the phone discussing health concerns. 30 minutes total spent in review of patient's record and preparation of their chart.  I verified patient identity using two factors (patient name and date of birth). Patient consents verbally to being seen via telemedicine visit today.

## 2019-11-30 NOTE — Assessment & Plan Note (Signed)
Chronic, exacerbated due to recent losses.  No SI/HI.  Will increase duloxetine to 60 mg daily and follow-up in 1 month.  Also encouraged close follow-up with psychiatrist and counselor-has a visit with them early next week.  Patient to call or return to clinic with any concerns in the meantime.

## 2019-11-30 NOTE — Assessment & Plan Note (Signed)
>>  ASSESSMENT AND PLAN FOR DEPRESSION, RECURRENT (HCC) WRITTEN ON 11/30/2019  5:24 PM BY MARTINEZ, JESSICA A, NP  Chronic, exacerbated due to recent losses.  No SI/HI.  Will increase duloxetine to 60 mg daily and follow-up in 1 month.  Also encouraged close follow-up with psychiatrist and counselor-has a visit with them early next week.  Patient to call or return to clinic with any concerns in the meantime.

## 2019-11-30 NOTE — Patient Instructions (Signed)
Managing Loss, Adult People experience loss in many different ways throughout their lives. Events such as moving, changing jobs, and losing friends can create a sense of loss. The loss may be as serious as a major health change, divorce, death of a pet, or death of a loved one. All of these types of loss are likely to create a physical and emotional reaction known as grief. Grief is the result of a major change or an absence of something or someone that you count on. Grief is a normal reaction to loss. A variety of factors can affect your grieving experience, including:  The nature of your loss.  Your relationship to what or whom you lost.  Your understanding of grief and how to manage it.  Your support system. How to manage lifestyle changes Keep to your normal routine as much as possible.  If you have trouble focusing or doing normal activities, it is acceptable to take some time away from your normal routine.  Spend time with friends and loved ones.  Eat a healthy diet, get plenty of sleep, and rest when you feel tired. How to recognize changes  The way that you deal with your grief will affect your ability to function as you normally do. When grieving, you may experience these changes:  Numbness, shock, sadness, anxiety, anger, denial, and guilt.  Thoughts about death.  Unexpected crying.  A physical sensation of emptiness in your stomach.  Problems sleeping and eating.  Tiredness (fatigue).  Loss of interest in normal activities.  Dreaming about or imagining seeing the person who died.  A need to remember what or whom you lost.  Difficulty thinking about anything other than your loss for a period of time.  Relief. If you have been expecting the loss for a while, you may feel a sense of relief when it happens. Follow these instructions at home:  Activity Express your feelings in healthy ways, such as:  Talking with others about your loss. It may be helpful to find  others who have had a similar loss, such as a support group.  Writing down your feelings in a journal.  Doing physical activities to release stress and emotional energy.  Doing creative activities like painting, sculpting, or playing or listening to music.  Practicing resilience. This is the ability to recover and adjust after facing challenges. Reading some resources that encourage resilience may help you to learn ways to practice those behaviors. General instructions  Be patient with yourself and others. Allow the grieving process to happen, and remember that grieving takes time. ? It is likely that you may never feel completely done with some grief. You may find a way to move on while still cherishing memories and feelings about your loss. ? Accepting your loss is a process. It can take months or longer to adjust.  Keep all follow-up visits as told by your health care provider. This is important. Where to find support To get support for managing loss:  Ask your health care provider for help and recommendations, such as grief counseling or therapy.  Think about joining a support group for people who are managing a loss. Where to find more information You can find more information about managing loss from:  American Society of Clinical Oncology: www.cancer.net  American Psychological Association: www.apa.org Contact a health care provider if:  Your grief is extreme and keeps getting worse.  You have ongoing grief that does not improve.  Your body shows symptoms of grief, such   as illness.  You feel depressed, anxious, or lonely. Get help right away if:  You have thoughts about hurting yourself or others. If you ever feel like you may hurt yourself or others, or have thoughts about taking your own life, get help right away. You can go to your nearest emergency department or call:  Your local emergency services (911 in the [place]  A suicide crisis helpline, such as the  National Suicide Prevention Lifeline at 1-800-273-8255. This is open 24 hours a day. Summary  Grief is the result of a major change or an absence of someone or something that you count on. Grief is a normal reaction to loss.  The depth of grief and the period of recovery depend on the type of loss and your ability to adjust to the change and process your feelings.  Processing grief requires patience and a willingness to accept your feelings and talk about your loss with people who are supportive.  It is important to find resources that work for you and to realize that people experience grief differently. There is not one grieving process that works for everyone in the same way.  Be aware that when grief becomes extreme, it can lead to more severe issues like isolation, depression, anxiety, or suicidal thoughts. Talk with your health care provider if you have any of these issues. This information is not intended to replace advice given to you by your health care provider. Make sure you discuss any questions you have with your health care provider. Document Revised: 04/17/2018 Document Reviewed: 06/27/2016 Elsevier Patient Education  2020 [employer]  

## 2019-12-06 ENCOUNTER — Ambulatory Visit (INDEPENDENT_AMBULATORY_CARE_PROVIDER_SITE_OTHER): Payer: Medicare Other | Admitting: Licensed Clinical Social Worker

## 2019-12-06 DIAGNOSIS — F339 Major depressive disorder, recurrent, unspecified: Secondary | ICD-10-CM

## 2019-12-06 DIAGNOSIS — F4321 Adjustment disorder with depressed mood: Secondary | ICD-10-CM | POA: Diagnosis not present

## 2019-12-06 DIAGNOSIS — F988 Other specified behavioral and emotional disorders with onset usually occurring in childhood and adolescence: Secondary | ICD-10-CM

## 2019-12-06 DIAGNOSIS — R0602 Shortness of breath: Secondary | ICD-10-CM

## 2019-12-06 DIAGNOSIS — F419 Anxiety disorder, unspecified: Secondary | ICD-10-CM

## 2019-12-06 NOTE — Chronic Care Management (AMB) (Signed)
Chronic Care Management    Clinical Social Work Follow Up Note  12/06/2019 Name: Sandra Stout MRN: [id number] DOB: 17-Apr-1945  Sandra Stout is a 74 y.o. year old female who is a primary care patient of [name], DO. The CCM team was consulted for assistance with Mental Health Counseling and Resources and Grief Counseling.   Review of patient status, including review of consultants reports, other relevant assessments, and collaboration with appropriate care team members and the patient's provider was performed as part of comprehensive patient evaluation and provision of chronic care management services.    SDOH (Social Determinants of Health) assessments performed: Yes    Outpatient Encounter Medications as of 12/06/2019  Medication Sig  . ALPRAZolam (XANAX) 0.5 MG tablet Take 0.5 mg by mouth 2 (two) times daily.   . DULoxetine (CYMBALTA) 60 MG capsule Take 1 capsule (60 mg total) by mouth daily.  . Lidocaine 4 % PTCH Apply 1 patch topically every 12 (twelve) hours.  . methylphenidate (RITALIN) 20 MG tablet Take 20 mg by mouth 4 (four) times daily.   . naproxen (NAPROSYN) 500 MG tablet Take 1 tablet (500 mg total) by mouth 2 (two) times daily with a meal.  . Omega-3 Fatty Acids (FISH OIL) 1000 MG CAPS Take by mouth.  . Vitamins-Lipotropics (BALANCED B-50) TABS Take by mouth.   No facility-administered encounter medications on file as of 12/06/2019.     Goals Addressed    .  I lost my grandson (pt-stated)        Current Barriers:  . Chronic Mental Health needs related to Grief, Depression and Anxiety . Limited social support . ADL IADL limitations . Mental Health Concerns  . Social Isolation . Lacks knowledge of community resource: long term mental health support resources within her nearby area . Suicidal Ideation/Homicidal Ideation: No  Clinical Social Work Goal(s):  [name] Over the next 120 days, patient will work with SW  bi-monthly  by telephone or in person to reduce or  manage symptoms related to depression . Over the next 120 days, patient will work with SW to address concerns related to gaining appropriate self-care education and coping skills.   Interventions: . Patient interviewed and appropriate assessments performed: brief mental health assessment . UPDATE- Patient's grandson committed suicide on 11/21/19 and is having great difficulty with this recent loss. PCP recently increased patient's Cymbalta to 60 mg. Grandson's funeral was on 12/04/19. Patient also lost a close friend as well recently.  . Provided patient with information about available mental health support resources within the area . Discussed plans with patient for ongoing care management follow up and provided patient with direct contact information for care management team . Assisted patient/caregiver with obtaining information about health plan benefits . Provided education and assistance to client regarding Advanced Directives. . A voluntary and extensive discussion about advanced care planning including explanation and discussion of advanced was undertaken with the patient. Explanation regarding healthcare proxy and living will was reviewed and packet with forms with explanation of how to fill them out was given.   . Provided education to patient/caregiver about Hospice and/or Palliative Care services. Patient is agreeable to grief counseling referral. Referral was successfully placed on 12/06/19.  . Encouraged patient to consider long term psychiatry and therapy/counseling follow up. Patient reports that she will consider this additional support. Grief counseling referral placed today but patient was hesitant about services initially.  . Brief CBT provided during session. . Patient reports that her depression  and anxiety "comes in waves." She admits that she has a great fear of getting COVID. LCSW provided reflective listening to patient as this has caused her great anxiety. Patient does not  have a car. Patient reports that her family does not understand her diagnoses which causes her depression too. Patient reports that her granddaughter just recently moved to [facility] and she was able to spend the holiday with her.  UPDATE- Patient suffered a great loss (grandson's suicide on 11/21/19).   Managing Loss, Adult People experience loss in many different ways throughout their lives. Events such as moving, changing jobs, and losing friends can create a sense of loss. The loss may be as serious as a major health change, divorce, death of a pet, or death of a loved one. All of these types of loss are likely to create a physical and emotional reaction known as grief. Grief is the result of a major change or an absence of something or someone that you count on. Grief is a normal reaction to loss. A variety of factors can affect your grieving experience, including:  The nature of your loss.  Your relationship to what or whom you lost.  Your understanding of grief and how to manage it.  Your support system. How to manage lifestyle changes Keep to your normal routine as much as possible.  If you have trouble focusing or doing normal activities, it is acceptable to take some time away from your normal routine.  Spend time with friends and loved ones.  Eat a healthy diet, get plenty of sleep, and rest when you feel tired. How to recognize changes  The way that you deal with your grief will affect your ability to function as you normally do. When grieving, you may experience these changes:  Numbness, shock, sadness, anxiety, anger, denial, and guilt.  Thoughts about death.  Unexpected crying.  A physical sensation of emptiness in your stomach.  Problems sleeping and eating.  Tiredness (fatigue).  Loss of interest in normal activities.  Dreaming about or imagining seeing the person who died.  A need to remember what or whom you lost.  Difficulty thinking about anything other  than your loss for a period of time.  Relief. If you have been expecting the loss for a while, you may feel a sense of relief when it happens. Follow these instructions at home:    Activity Express your feelings in healthy ways, such as:  Talking with others about your loss. It may be helpful to find others who have had a similar loss, such as a support group.  Writing down your feelings in a journal.  Doing physical activities to release stress and emotional energy.  Doing creative activities like painting, sculpting, or playing or listening to music.  Practicing resilience. This is the ability to recover and adjust after facing challenges. Reading some resources that encourage resilience may help you to learn ways to practice those behaviors. General instructions 1. Be patient with yourself and others. Allow the grieving process to happen, and remember that grieving takes time. ? It is likely that you may never feel completely done with some grief. You may find a way to move on while still cherishing memories and feelings about your loss. ? Accepting your loss is a process. It can take months or longer to adjust. 2. Keep all follow-up visits as told by your health care provider. This is important. Where to find support To get support for managing loss:  Ask  your health care provider for help and recommendations, such as grief counseling or therapy.  Think about joining a support group for people who are managing a loss. Where to find more information You can find more information about managing loss from:  American Society of Clinical Oncology: www.cancer.net  American Psychological Association: [employer] Contact a health care provider if:  Your grief is extreme and keeps getting worse.  You have ongoing grief that does not improve.  Your body shows symptoms of grief, such as illness.  You feel depressed, anxious, or lonely. Get help right away if:  You have thoughts  about hurting yourself or others. If you ever feel like you may hurt yourself or others, or have thoughts about taking your own life, get help right away. You can go to your nearest emergency department or call:  Your local emergency services (911 in the [place]  A suicide crisis helpline, such as the National Suicide Prevention Lifeline at (575) 835-0050. This is open 24 hours a day. Summary  Grief is the result of a major change or an absence of someone or something that you count on. Grief is a normal reaction to loss.  The depth of grief and the period of recovery depend on the type of loss and your ability to adjust to the change and process your feelings.  Processing grief requires patience and a willingness to accept your feelings and talk about your loss with people who are supportive.  It is important to find resources that work for you and to realize that people experience grief differently. There is not one grieving process that works for everyone in the same way.  Be aware that when grief becomes extreme, it can lead to more severe issues like isolation, depression, anxiety, or suicidal thoughts. Talk with your health care provider if you have any of these issues. This information is not intended to replace advice given to you by your health care provider. Make sure you discuss any questions you have with your health care provider. Document Revised: 04/17/2018 Document Reviewed: 06/27/2016 Elsevier Patient Education  2020 [employer].  Patient Self Care Activities:  . Self administers medications as prescribed . Attends all scheduled provider appointments  Patient Coping Strengths:  . Hopefulness . Self Advocate  Patient Self Care Deficits:  . Lacks social connections  Please see past updates related to this goal by clicking on the "Past Updates" button in the selected goal      Follow Up Plan: SW will follow up with patient by phone over the next 30 days  Dickie La, BSW, MSW, LCSW [facility] [place]  [facility] [email or web address] Phone: [phone]

## 2019-12-31 ENCOUNTER — Telehealth: Payer: Medicare Other

## 2020-01-04 ENCOUNTER — Ambulatory Visit: Payer: Medicare Other | Admitting: Family Medicine

## 2020-01-05 ENCOUNTER — Ambulatory Visit: Payer: Medicare Other | Admitting: Licensed Clinical Social Worker

## 2020-01-05 DIAGNOSIS — K219 Gastro-esophageal reflux disease without esophagitis: Secondary | ICD-10-CM

## 2020-01-05 DIAGNOSIS — F988 Other specified behavioral and emotional disorders with onset usually occurring in childhood and adolescence: Secondary | ICD-10-CM

## 2020-01-05 DIAGNOSIS — R296 Repeated falls: Secondary | ICD-10-CM

## 2020-01-05 DIAGNOSIS — F339 Major depressive disorder, recurrent, unspecified: Secondary | ICD-10-CM

## 2020-01-05 DIAGNOSIS — G8929 Other chronic pain: Secondary | ICD-10-CM

## 2020-01-05 DIAGNOSIS — F419 Anxiety disorder, unspecified: Secondary | ICD-10-CM

## 2020-01-05 DIAGNOSIS — F4321 Adjustment disorder with depressed mood: Secondary | ICD-10-CM

## 2020-01-05 NOTE — Chronic Care Management (AMB) (Signed)
Chronic Care Management    Clinical Social Work Follow Up Note  01/05/2020 Name: SHAILEY BUTTERBAUGH MRN: [id number] DOB: Jun 04, 1945  BERDINA CHEEVER is a 74 y.o. year old female who is a primary care patient of [name], DO. The CCM team was consulted for assistance with Mental Health Counseling and Resources.   Review of patient status, including review of consultants reports, other relevant assessments, and collaboration with appropriate care team members and the patient's provider was performed as part of comprehensive patient evaluation and provision of chronic care management services.    SDOH (Social Determinants of Health) assessments performed: Yes    Outpatient Encounter Medications as of 01/05/2020  Medication Sig  . ALPRAZolam (XANAX) 0.5 MG tablet Take 0.5 mg by mouth 2 (two) times daily.   . DULoxetine (CYMBALTA) 60 MG capsule Take 1 capsule (60 mg total) by mouth daily.  . Lidocaine 4 % PTCH Apply 1 patch topically every 12 (twelve) hours.  . methylphenidate (RITALIN) 20 MG tablet Take 20 mg by mouth 4 (four) times daily.   . naproxen (NAPROSYN) 500 MG tablet Take 1 tablet (500 mg total) by mouth 2 (two) times daily with a meal.  . Omega-3 Fatty Acids (FISH OIL) 1000 MG CAPS Take by mouth.  . Vitamins-Lipotropics (BALANCED B-50) TABS Take by mouth.   No facility-administered encounter medications on file as of 01/05/2020.     Goals Addressed    .  I lost my grandson (pt-stated)        Current Barriers:  . Chronic Mental Health needs related to Grief, Depression and Anxiety . Limited social support . ADL IADL limitations . Mental Health Concerns  . Social Isolation . Lacks knowledge of community resource: long term mental health support resources within her nearby area . Suicidal Ideation/Homicidal Ideation: No  Clinical Social Work Goal(s):  [name] Over the next 120 days, patient will work with SW  bi-monthly  by telephone or in person to reduce or manage symptoms  related to depression . Over the next 120 days, patient will work with SW to address concerns related to gaining appropriate self-care education and coping skills.   Interventions: . Patient interviewed and appropriate assessments performed: brief mental health assessment . Update- Patient is having difficulty with anxiety and fixation. Patient reports that she is very upset that she missed her PCP appointment yesterday. She states that she was informed that this appointment with over the phone but it ended up being an office visit that she missed. Patient shares that this miscommunication has triggered her anxiety and stress. LCSW provided emotional support and spent time deescalating and redirecting patient throughout the entire session.  . Patient's grandson committed suicide on 11/21/19 and is having great difficulty with this recent loss. PCP recently increased patient's Cymbalta to 60 mg. Grandson's funeral was on 12/04/19. Patient also lost a close friend as well recently.  [name] LCSW completed review of mental community resource on 01/05/20. Patient says she has a mental health appointment with a Dr. Armandina Gemma on 02/11/20 in [place], [place].  [name] Provided patient with information about available mental health support resources within the area . Discussed plans with patient for ongoing care management follow up and provided patient with direct contact information for care management team . Assisted patient/caregiver with obtaining information about health plan benefits . Provided education and assistance to client regarding Advanced Directives. . A voluntary and extensive discussion about advanced care planning including explanation and discussion of advanced  was undertaken with the patient. Explanation regarding healthcare proxy and living will was reviewed and packet with forms with explanation of how to fill them out was given.   . Provided education to patient/caregiver about Hospice and/or  Palliative Care services. Patient is agreeable to grief counseling referral. Referral was successfully placed on 12/06/19. Patient is receiving ongoing grief therapy sessions and has one scheduled tomorrow. Patient's daughter Toniann Fail is also receiving grief therapy there.  . Encouraged patient to consider long term psychiatry and therapy/counseling follow up. Patient reports that she will consider this additional support. Grief counseling referral placed today but patient was hesitant about services initially.  . Brief CBT provided during session. . Patient reports that her depression and anxiety "comes in waves." She admits that she has a great fear of getting COVID. LCSW provided reflective listening to patient as this has caused her great anxiety. Patient does not have a car. Patient reports that her family does not understand her diagnoses which causes her depression too. Patient reports that her granddaughter just recently moved to [facility] and she was able to spend the holiday with her.  UPDATE- Patient suffered a great loss (grandson's suicide on 11/21/19).  Managing Loss, Adult People experience loss in many different ways throughout their lives. Events such as moving, changing jobs, and losing friends can create a sense of loss. The loss may be as serious as a major health change, divorce, death of a pet, or death of a loved one. All of these types of loss are likely to create a physical and emotional reaction known as grief. Grief is the result of a major change or an absence of something or someone that you count on. Grief is a normal reaction to loss. A variety of factors can affect your grieving experience, including:  The nature of your loss.  Your relationship to what or whom you lost.  Your understanding of grief and how to manage it.  Your support system. How to manage lifestyle changes Keep to your normal routine as much as possible.  If you have trouble focusing or doing normal  activities, it is acceptable to take some time away from your normal routine.  Spend time with friends and loved ones.  Eat a healthy diet, get plenty of sleep, and rest when you feel tired. How to recognize changes  The way that you deal with your grief will affect your ability to function as you normally do. When grieving, you may experience these changes:  Numbness, shock, sadness, anxiety, anger, denial, and guilt.  Thoughts about death.  Unexpected crying.  A physical sensation of emptiness in your stomach.  Problems sleeping and eating.  Tiredness (fatigue).  Loss of interest in normal activities.  Dreaming about or imagining seeing the person who died.  A need to remember what or whom you lost.  Difficulty thinking about anything other than your loss for a period of time.  Relief. If you have been expecting the loss for a while, you may feel a sense of relief when it happens. Follow these instructions at home:    Activity Express your feelings in healthy ways, such as:  Talking with others about your loss. It may be helpful to find others who have had a similar loss, such as a support group.  Writing down your feelings in a journal.  Doing physical activities to release stress and emotional energy.  Doing creative activities like painting, sculpting, or playing or listening to music.  Practicing resilience.  This is the ability to recover and adjust after facing challenges. Reading some resources that encourage resilience may help you to learn ways to practice those behaviors. General instructions 1. Be patient with yourself and others. Allow the grieving process to happen, and remember that grieving takes time. ? It is likely that you may never feel completely done with some grief. You may find a way to move on while still cherishing memories and feelings about your loss. ? Accepting your loss is a process. It can take months or longer to adjust. 2. Keep all  follow-up visits as told by your health care provider. This is important. Where to find support To get support for managing loss:  Ask your health care provider for help and recommendations, such as grief counseling or therapy.  Think about joining a support group for people who are managing a loss. Where to find more information You can find more information about managing loss from:  American Society of Clinical Oncology: www.cancer.net  American Psychological Association: [employer] Contact a health care provider if:  Your grief is extreme and keeps getting worse.  You have ongoing grief that does not improve.  Your body shows symptoms of grief, such as illness.  You feel depressed, anxious, or lonely. Get help right away if:  You have thoughts about hurting yourself or others. If you ever feel like you may hurt yourself or others, or have thoughts about taking your own life, get help right away. You can go to your nearest emergency department or call:  Your local emergency services (911 in the [place]  A suicide crisis helpline, such as the National Suicide Prevention Lifeline at 252-804-0324. This is open 24 hours a day. Summary  Grief is the result of a major change or an absence of someone or something that you count on. Grief is a normal reaction to loss.  The depth of grief and the period of recovery depend on the type of loss and your ability to adjust to the change and process your feelings.  Processing grief requires patience and a willingness to accept your feelings and talk about your loss with people who are supportive.  It is important to find resources that work for you and to realize that people experience grief differently. There is not one grieving process that works for everyone in the same way.  Be aware that when grief becomes extreme, it can lead to more severe issues like isolation, depression, anxiety, or suicidal thoughts. Talk with your health  care provider if you have any of these issues. This information is not intended to replace advice given to you by your health care provider. Make sure you discuss any questions you have with your health care provider. Document Revised: 04/17/2018 Document Reviewed: 06/27/2016 Elsevier Patient Education  2020 [employer].   Patient Self Care Activities:  . Self administers medications as prescribed . Attends all scheduled provider appointments  Patient Coping Strengths:  . Hopefulness . Self Advocate  Patient Self Care Deficits:  . Lacks social connections  Please see past updates related to this goal by clicking on the "Past Updates" button in the selected goal      Follow Up Plan: SW will follow up with patient by phone over the next quarter  Dickie La, BSW, MSW, LCSW [facility] [place]  [facility] [email or web address] Phone: [phone]

## 2020-01-10 ENCOUNTER — Ambulatory Visit (INDEPENDENT_AMBULATORY_CARE_PROVIDER_SITE_OTHER): Payer: Medicare Other

## 2020-01-10 VITALS — Ht 62.0 in | Wt 100.0 lb

## 2020-01-10 DIAGNOSIS — Z0000 Encounter for general adult medical examination without abnormal findings: Secondary | ICD-10-CM

## 2020-01-10 NOTE — Patient Instructions (Signed)
[name] , Thank you for taking time to come for your Medicare Wellness Visit. I appreciate your ongoing commitment to your health goals. Please review the following plan we discussed and let me know if I can assist you in the future.   Screening recommendations/referrals: Colonoscopy: FOBT 06/15/2019 Mammogram: completed 01/29/2019, due 01/29/2020 Bone Density: decline Recommended yearly ophthalmology/optometry visit for glaucoma screening and checkup Recommended yearly dental visit for hygiene and checkup  Vaccinations: Influenza vaccine: decline Pneumococcal vaccine: completed 02/03/2018 Tdap vaccine: decline Shingles vaccine:  discussed   Covid-19: decline  Advanced directives: Advance directive discussed with you today.    Conditions/risks identified: smoking  Next appointment: Follow up in one year for your annual wellness visit    Preventive Care 65 Years and Older, Female Preventive care refers to lifestyle choices and visits with your health care provider that can promote health and wellness. What does preventive care include?  A yearly physical exam. This is also called an annual well check.  Dental exams once or twice a year.  Routine eye exams. Ask your health care provider how often you should have your eyes checked.  Personal lifestyle choices, including:  Daily care of your teeth and gums.  Regular physical activity.  Eating a healthy diet.  Avoiding tobacco and drug use.  Limiting alcohol use.  Practicing safe sex.  Taking low-dose aspirin every day.  Taking vitamin and mineral supplements as recommended by your health care provider. What happens during an annual well check? The services and screenings done by your health care provider during your annual well check will depend on your age, overall health, lifestyle risk factors, and family history of disease. Counseling  Your health care provider may ask you questions about your:  Alcohol  use.  Tobacco use.  Drug use.  Emotional well-being.  Home and relationship well-being.  Sexual activity.  Eating habits.  History of falls.  Memory and ability to understand (cognition).  Work and work Astronomer.  Reproductive health. Screening  You may have the following tests or measurements:  Height, weight, and BMI.  Blood pressure.  Lipid and cholesterol levels. These may be checked every 5 years, or more frequently if you are over 49 years old.  Skin check.  Lung cancer screening. You may have this screening every year starting at age 66 if you have a 30-pack-year history of smoking and currently smoke or have quit within the past 15 years.  Fecal occult blood test (FOBT) of the stool. You may have this test every year starting at age 52.  Flexible sigmoidoscopy or colonoscopy. You may have a sigmoidoscopy every 5 years or a colonoscopy every 10 years starting at age 58.  Hepatitis C blood test.  Hepatitis B blood test.  Sexually transmitted disease (STD) testing.  Diabetes screening. This is done by checking your blood sugar (glucose) after you have not eaten for a while (fasting). You may have this done every 1-3 years.  Bone density scan. This is done to screen for osteoporosis. You may have this done starting at age 28.  Mammogram. This may be done every 1-2 years. Talk to your health care provider about how often you should have regular mammograms. Talk with your health care provider about your test results, treatment options, and if necessary, the need for more tests. Vaccines  Your health care provider may recommend certain vaccines, such as:  Influenza vaccine. This is recommended every year.  Tetanus, diphtheria, and acellular pertussis (Tdap, Td) vaccine. You  may need a Td booster every 10 years.  Zoster vaccine. You may need this after age 33.  Pneumococcal 13-valent conjugate (PCV13) vaccine. One dose is recommended after age  42.  Pneumococcal polysaccharide (PPSV23) vaccine. One dose is recommended after age 59. Talk to your health care provider about which screenings and vaccines you need and how often you need them. This information is not intended to replace advice given to you by your health care provider. Make sure you discuss any questions you have with your health care provider. Document Released: 03/10/2015 Document Revised: 11/01/2015 Document Reviewed: 12/13/2014 Elsevier Interactive Patient Education  2017 [place] Prevention in the Home Falls can cause injuries. They can happen to people of all ages. There are many things you can do to make your home safe and to help prevent falls. What can I do on the outside of my home?  Regularly fix the edges of walkways and driveways and fix any cracks.  Remove anything that might make you trip as you walk through a door, such as a raised step or threshold.  Trim any bushes or trees on the path to your home.  Use bright outdoor lighting.  Clear any walking paths of anything that might make someone trip, such as rocks or tools.  Regularly check to see if handrails are loose or broken. Make sure that both sides of any steps have handrails.  Any raised decks and porches should have guardrails on the edges.  Have any leaves, snow, or ice cleared regularly.  Use sand or salt on walking paths during winter.  Clean up any spills in your garage right away. This includes oil or grease spills. What can I do in the bathroom?  Use night lights.  Install grab bars by the toilet and in the tub and shower. Do not use towel bars as grab bars.  Use non-skid mats or decals in the tub or shower.  If you need to sit down in the shower, use a plastic, non-slip stool.  Keep the floor dry. Clean up any water that spills on the floor as soon as it happens.  Remove soap buildup in the tub or shower regularly.  Attach bath mats securely with double-sided  non-slip rug tape.  Do not have throw rugs and other things on the floor that can make you trip. What can I do in the bedroom?  Use night lights.  Make sure that you have a light by your bed that is easy to reach.  Do not use any sheets or blankets that are too big for your bed. They should not hang down onto the floor.  Have a firm chair that has side arms. You can use this for support while you get dressed.  Do not have throw rugs and other things on the floor that can make you trip. What can I do in the kitchen?  Clean up any spills right away.  Avoid walking on wet floors.  Keep items that you use a lot in easy-to-reach places.  If you need to reach something above you, use a strong step stool that has a grab bar.  Keep electrical cords out of the way.  Do not use floor polish or wax that makes floors slippery. If you must use wax, use non-skid floor wax.  Do not have throw rugs and other things on the floor that can make you trip. What can I do with my stairs?  Do not leave any items  on the stairs.  Make sure that there are handrails on both sides of the stairs and use them. Fix handrails that are broken or loose. Make sure that handrails are as long as the stairways.  Check any carpeting to make sure that it is firmly attached to the stairs. Fix any carpet that is loose or worn.  Avoid having throw rugs at the top or bottom of the stairs. If you do have throw rugs, attach them to the floor with carpet tape.  Make sure that you have a light switch at the top of the stairs and the bottom of the stairs. If you do not have them, ask someone to add them for you. What else can I do to help prevent falls?  Wear shoes that:  Do not have high heels.  Have rubber bottoms.  Are comfortable and fit you well.  Are closed at the toe. Do not wear sandals.  If you use a stepladder:  Make sure that it is fully opened. Do not climb a closed stepladder.  Make sure that both  sides of the stepladder are locked into place.  Ask someone to hold it for you, if possible.  Clearly mark and make sure that you can see:  Any grab bars or handrails.  First and last steps.  Where the edge of each step is.  Use tools that help you move around (mobility aids) if they are needed. These include:  Canes.  Walkers.  Scooters.  Crutches.  Turn on the lights when you go into a dark area. Replace any light bulbs as soon as they burn out.  Set up your furniture so you have a clear path. Avoid moving your furniture around.  If any of your floors are uneven, fix them.  If there are any pets around you, be aware of where they are.  Review your medicines with your doctor. Some medicines can make you feel dizzy. This can increase your chance of falling. Ask your doctor what other things that you can do to help prevent falls. This information is not intended to replace advice given to you by your health care provider. Make sure you discuss any questions you have with your health care provider. Document Released: 12/08/2008 Document Revised: 07/20/2015 Document Reviewed: 03/18/2014 Elsevier Interactive Patient Education  2017 [employer].

## 2020-01-10 NOTE — Progress Notes (Signed)
I connected with Sandra Stout today by telephone and verified that I am speaking with the correct person using two identifiers. Location patient: home Location provider: work Persons participating in the virtual visit: Sandra Stout, Sandra Ponder LPN.   I discussed the limitations, risks, security and privacy concerns of performing an evaluation and management service by telephone and the availability of in person appointments. I also discussed with the patient that there may be a patient responsible charge related to this service. The patient expressed understanding and verbally consented to this telephonic visit.    Interactive audio and video telecommunications were attempted between this provider and patient, however failed, due to patient having technical difficulties OR patient did not have access to video capability.  We continued and completed visit with audio only.     Vital signs may be patient reported or missing.  Subjective:   [name] is a 74 y.o. female who presents for Medicare Annual (Subsequent) preventive examination.  Review of Systems     Cardiac Risk Factors include: advanced age (>39men, >63 women);sedentary lifestyle     Objective:    Today's Vitals   01/10/20 1511 01/10/20 1512  Weight: 100 lb (45.4 kg)   Height:  (1.575 m)   PainSc:  7    Body mass index is 18.29 kg/m.  Advanced Directives 01/10/2020 10/06/2018 10/06/2018 02/21/2016 02/20/2016  Does Patient Have a Medical Advance Directive? No No No No No  Would patient like information on creating a medical advance directive? - No - Patient declined No - Patient declined No - Patient declined -    Current Medications (verified) Outpatient Encounter Medications as of 01/10/2020  Medication Sig  . ALPRAZolam (XANAX) 0.5 MG tablet Take 0.5 mg by mouth 2 (two) times daily.   [name] co-enzyme Q-10 50 MG capsule Take 50 mg by mouth daily.  . DULoxetine (CYMBALTA) 60 MG capsule Take 1 capsule (60 mg  total) by mouth daily.  . methylphenidate (RITALIN) 20 MG tablet Take 20 mg by mouth 4 (four) times daily.   . Omega-3 Fatty Acids (FISH OIL) 1000 MG CAPS Take by mouth.  . Vitamins-Lipotropics (BALANCED B-50) TABS Take by mouth.  . Lidocaine 4 % PTCH Apply 1 patch topically every 12 (twelve) hours. (Patient not taking: Reported on 01/10/2020)  . naproxen (NAPROSYN) 500 MG tablet Take 1 tablet (500 mg total) by mouth 2 (two) times daily with a meal. (Patient not taking: Reported on 01/10/2020)   No facility-administered encounter medications on file as of 01/10/2020.    Allergies (verified) Patient has no known allergies.   History: Past Medical History:  Diagnosis Date  . Acute respiratory failure with hypoxia (HCC) 10/06/2018  . ADD (attention deficit disorder)   . Anxiety   . Community acquired pneumonia 10/06/2018  . Hemorrhoid   . Hypokalemia   . Narcolepsy   . Panic attacks   . Pneumonia    Past Surgical History:  Procedure Laterality Date  . TUBAL LIGATION     Family History  Problem Relation Age of Onset  . Lung disease Mother   . Thyroid disease Sister   . Lung disease Sister   . Cancer Sister        Bladder  . Heart disease Father   . Breast cancer Neg Hx    Social History   Socioeconomic History  . Marital status: Married    Spouse name: Not on file  . Number of children: Not on file  . Years  of education: Not on file  . Highest education level: Not on file  Occupational History  . Occupation: retired  Tobacco Use  . Smoking status: Current Every Day Smoker    Packs/day: 0.50    Types: Cigarettes  . Smokeless tobacco: Never Used  Substance and Sexual Activity  . Alcohol use: No  . Drug use: No  . Sexual activity: Not Currently  Other Topics Concern  . Not on file  Social History Narrative  . Not on file   Social Determinants of Health   Financial Resource Strain: Medium Risk  . Difficulty of Paying Living Expenses: Somewhat hard  Food  Insecurity: No Food Insecurity  . Worried About Programme researcher, broadcasting/film/video in the Last Year: Never true  . Ran Out of Food in the Last Year: Never true  Transportation Needs: No Transportation Needs  . Lack of Transportation (Medical): No  . Lack of Transportation (Non-Medical): No  Physical Activity: Inactive  . Days of Exercise per Week: 0 days  . Minutes of Exercise per Session: 0 min  Stress: Stress Concern Present  . Feeling of Stress : Very much  Social Connections:   . Frequency of Communication with Friends and Family: Not on file  . Frequency of Social Gatherings with Friends and Family: Not on file  . Attends Religious Services: Not on file  . Active Member of Clubs or Organizations: Not on file  . Attends Banker Meetings: Not on file  . Marital Status: Not on file    Tobacco Counseling Ready to quit: No Counseling given: Not Answered   Clinical Intake:  Pre-visit preparation completed: Yes  Pain : 0-10 Pain Score: 7  Pain Type: Chronic pain Pain Location: Back Pain Orientation: Lower Pain Descriptors / Indicators: Aching, Throbbing Pain Onset: More than a month ago Pain Frequency: Constant     Nutritional Status: BMI <19  Underweight Nutritional Risks: None Diabetes: No  How often do you need to have someone help you when you read instructions, pamphlets, or other written materials from your doctor or pharmacy?: 1 - Never  Diabetic? no  Interpreter Needed?: No  Information entered by :: NAllen LPN   Activities of Daily Living In your present state of health, do you have any difficulty performing the following activities: 01/10/2020 11/30/2019  Hearing? N N  Comment slight decrease -  Vision? Y N  Difficulty concentrating or making decisions? Y N  Walking or climbing stairs? Y N  Dressing or bathing? N N  Doing errands, shopping? Y N  Comment has someone with her at all times -  Preparing Food and eating ? N -  Using the Toilet? N -  In  the past six months, have you accidently leaked urine? N -  Do you have problems with loss of bowel control? N -  Managing your Medications? N -  Managing your Finances? N -  Housekeeping or managing your Housekeeping? N -  Some recent data might be hidden    Patient Care Team: [name], DO as PCP - General (Family Medicine) Gustavus Bryant, LCSW as Social Worker (Licensed Clinical Social Worker)  Indicate any recent [employer] you may have received from other than [facility] providers in the past year (date may be approximate).     Assessment:   This is a routine wellness examination for [place].  Hearing/Vision screen  Hearing Screening   125Hz  250Hz  500Hz  1000Hz  2000Hz  3000Hz  4000Hz  6000Hz  8000Hz   Right ear:  Left ear:           Vision Screening Comments: Regular eye exams, [facility]  Dietary issues and exercise activities discussed: Current Exercise Habits: The patient does not participate in regular exercise at present  Goals    .  I lost my grandson (pt-stated)      Current Barriers:  . Chronic Mental Health needs related to Grief, Depression and Anxiety . Limited social support . ADL IADL limitations . Mental Health Concerns  . Social Isolation . Lacks knowledge of community resource: long term mental health support resources within her nearby area . Suicidal Ideation/Homicidal Ideation: No  Clinical Social Work Goal(s):  [name] Over the next 120 days, patient will work with SW  bi-monthly  by telephone or in person to reduce or manage symptoms related to depression . Over the next 120 days, patient will work with SW to address concerns related to gaining appropriate self-care education and coping skills.   Interventions: . Patient interviewed and appropriate assessments performed: brief mental health assessment . Update- Patient is having difficulty with anxiety and fixation. Patient reports that she is very upset that she missed her PCP appointment  yesterday. She states that she was informed that this appointment with over the phone but it ended up being an office visit that she missed. Patient shares that this miscommunication has triggered her anxiety and stress. LCSW provided emotional support and spent time deescalating and redirecting patient throughout the entire session.  . Patient's grandson committed suicide on 11/21/19 and is having great difficulty with this recent loss. PCP recently increased patient's Cymbalta to 60 mg. Grandson's funeral was on 12/04/19. Patient also lost a close friend as well recently.  [name] LCSW completed review of mental community resource on 01/05/20. Patient says she has a mental health appointment with a Dr. Armandina Gemma on 02/11/20 in [place], [place].  [name] Provided patient with information about available mental health support resources within the area . Discussed plans with patient for ongoing care management follow up and provided patient with direct contact information for care management team . Assisted patient/caregiver with obtaining information about health plan benefits . Provided education and assistance to client regarding Advanced Directives. . A voluntary and extensive discussion about advanced care planning including explanation and discussion of advanced was undertaken with the patient. Explanation regarding healthcare proxy and living will was reviewed and packet with forms with explanation of how to fill them out was given.   . Provided education to patient/caregiver about Hospice and/or Palliative Care services. Patient is agreeable to grief counseling referral. Referral was successfully placed on 12/06/19. Patient is receiving ongoing grief therapy sessions and has one scheduled tomorrow. Patient's daughter Toniann Fail is also receiving grief therapy there.  . Encouraged patient to consider long term psychiatry and therapy/counseling follow up. Patient reports that she will consider this additional  support. Grief counseling referral placed today but patient was hesitant about services initially.  . Brief CBT provided during session. . Patient reports that her depression and anxiety "comes in waves." She admits that she has a great fear of getting COVID. LCSW provided reflective listening to patient as this has caused her great anxiety. Patient does not have a car. Patient reports that her family does not understand her diagnoses which causes her depression too. Patient reports that her granddaughter just recently moved to [facility] and she was able to spend the holiday with her.  UPDATE- Patient suffered a great loss (grandson's suicide on 11/21/19).  Managing  Loss, Adult People experience loss in many different ways throughout their lives. Events such as moving, changing jobs, and losing friends can create a sense of loss. The loss may be as serious as a major health change, divorce, death of a pet, or death of a loved one. All of these types of loss are likely to create a physical and emotional reaction known as grief. Grief is the result of a major change or an absence of something or someone that you count on. Grief is a normal reaction to loss. A variety of factors can affect your grieving experience, including:  The nature of your loss.  Your relationship to what or whom you lost.  Your understanding of grief and how to manage it.  Your support system. How to manage lifestyle changes Keep to your normal routine as much as possible.  If you have trouble focusing or doing normal activities, it is acceptable to take some time away from your normal routine.  Spend time with friends and loved ones.  Eat a healthy diet, get plenty of sleep, and rest when you feel tired. How to recognize changes  The way that you deal with your grief will affect your ability to function as you normally do. When grieving, you may experience these changes:  Numbness, shock, sadness, anxiety, anger,  denial, and guilt.  Thoughts about death.  Unexpected crying.  A physical sensation of emptiness in your stomach.  Problems sleeping and eating.  Tiredness (fatigue).  Loss of interest in normal activities.  Dreaming about or imagining seeing the person who died.  A need to remember what or whom you lost.  Difficulty thinking about anything other than your loss for a period of time.  Relief. If you have been expecting the loss for a while, you may feel a sense of relief when it happens. Follow these instructions at home:    Activity Express your feelings in healthy ways, such as:  Talking with others about your loss. It may be helpful to find others who have had a similar loss, such as a support group.  Writing down your feelings in a journal.  Doing physical activities to release stress and emotional energy.  Doing creative activities like painting, sculpting, or playing or listening to music.  Practicing resilience. This is the ability to recover and adjust after facing challenges. Reading some resources that encourage resilience may help you to learn ways to practice those behaviors. General instructions 1. Be patient with yourself and others. Allow the grieving process to happen, and remember that grieving takes time. ? It is likely that you may never feel completely done with some grief. You may find a way to move on while still cherishing memories and feelings about your loss. ? Accepting your loss is a process. It can take months or longer to adjust. 2. Keep all follow-up visits as told by your health care provider. This is important. Where to find support To get support for managing loss:  Ask your health care provider for help and recommendations, such as grief counseling or therapy.  Think about joining a support group for people who are managing a loss. Where to find more information You can find more information about managing loss from:  American Society  of Clinical Oncology: www.cancer.net  American Psychological Association: [employer] Contact a health care provider if:  Your grief is extreme and keeps getting worse.  You have ongoing grief that does not improve.  Your body shows symptoms of  grief, such as illness.  You feel depressed, anxious, or lonely. Get help right away if:  You have thoughts about hurting yourself or others. If you ever feel like you may hurt yourself or others, or have thoughts about taking your own life, get help right away. You can go to your nearest emergency department or call:  Your local emergency services (911 in the [place]  A suicide crisis helpline, such as the National Suicide Prevention Lifeline at 289-272-5256. This is open 24 hours a day. Summary  Grief is the result of a major change or an absence of someone or something that you count on. Grief is a normal reaction to loss.  The depth of grief and the period of recovery depend on the type of loss and your ability to adjust to the change and process your feelings.  Processing grief requires patience and a willingness to accept your feelings and talk about your loss with people who are supportive.  It is important to find resources that work for you and to realize that people experience grief differently. There is not one grieving process that works for everyone in the same way.  Be aware that when grief becomes extreme, it can lead to more severe issues like isolation, depression, anxiety, or suicidal thoughts. Talk with your health care provider if you have any of these issues. This information is not intended to replace advice given to you by your health care provider. Make sure you discuss any questions you have with your health care provider. Document Revised: 04/17/2018 Document Reviewed: 06/27/2016 Elsevier Patient Education  2020 [employer].   Patient Self Care Activities:  . Self administers medications as  prescribed . Attends all scheduled provider appointments  Patient Coping Strengths:  . Hopefulness . Self Advocate  Patient Self Care Deficits:  . Lacks social connections  Please see past updates related to this goal by clicking on the "Past Updates" button in the selected goal      .  Patient Stated      01/10/2020, wants to handle stress better      Depression Screen PHQ 2/9 Scores 01/10/2020 11/30/2019 09/09/2019 08/09/2019 07/13/2019 10/15/2018 02/03/2018  PHQ - 2 Score 4 5 - PHQ- 9 Score 13 12 - Exception Documentation - - Patient refusal - - - -    Fall Risk Fall Risk  01/10/2020 11/30/2019 10/15/2018 08/17/2014  Falls in the past year? Yes  Comment fainted, has narcolepsy - - -  Number falls in past yr: or more  Injury with Fall? 0 0 0 Yes  Risk Factor Category  - - - High Fall Risk  Risk for fall due to : History of fall(s);Impaired balance/gait;Medication side effect History of fall(s) Impaired balance/gait;History of fall(s);Medication side effect Medication side effect  Follow up Falls evaluation completed;Education provided;Falls prevention discussed Falls evaluation completed - Follow up appointment    Any stairs in or around the home? Yes  If so, are there any without handrails? No  Home free of loose throw rugs in walkways, pet beds, electrical cords, etc? Yes  Adequate lighting in your home to reduce risk of falls? Yes   ASSISTIVE DEVICES UTILIZED TO PREVENT FALLS:  Life alert? No  Use of a cane, walker or w/c? No  Grab bars in the bathroom? No  Shower chair or bench in shower? No  Elevated toilet seat or a handicapped toilet?  No   TIMED UP AND GO:  Was the test performed? No .      Cognitive Function:     6CIT Screen 01/10/2020  What Year? 0 points  What month? 0 points  What time? 0 points  Count back from 20 0 points  Months in reverse 0 points  Repeat phrase 2 points  Total Score 2     Immunizations Immunization History  Administered Date(s) Administered  . Influenza, High Dose Seasonal PF 02/03/2018  . Influenza,inj,Quad PF,6+ Mos 02/22/2016  . Influenza-Unspecified 10/07/2012, 04/26/2014  . Pneumococcal Conjugate-13 02/03/2018  . Pneumococcal Polysaccharide-23 02/22/2016    TDAP status: Due, Education has been provided regarding the importance of this vaccine. Advised may receive this vaccine at local pharmacy or Health Dept. Aware to provide a copy of the vaccination record if obtained from local pharmacy or Health Dept. Verbalized acceptance and understanding. Flu Vaccine status: Declined, Education has been provided regarding the importance of this vaccine but patient still declined. Advised may receive this vaccine at local pharmacy or Health Dept. Aware to provide a copy of the vaccination record if obtained from local pharmacy or Health Dept. Verbalized acceptance and understanding. Pneumococcal vaccine status: Up to date Covid-19 vaccine status: Information provided on how to obtain vaccines.   Qualifies for Shingles Vaccine? Yes   Zostavax completed No   Shingrix Completed?: No.    Education has been provided regarding the importance of this vaccine. Patient has been advised to call insurance company to determine out of pocket expense if they have not yet received this vaccine. Advised may also receive vaccine at local pharmacy or Health Dept. Verbalized acceptance and understanding.  Screening Tests Health Maintenance  Topic Date Due  . Hepatitis C Screening  Never done  . COVID-19 Vaccine (1) Never done  . COLONOSCOPY  Never done  . DEXA SCAN  Never done  . INFLUENZA VACCINE  09/26/2019  . TETANUS/TDAP  11/29/2020 (Originally 06/13/1964)  . PNA vac Low Risk Adult  Completed    Health Maintenance  Health Maintenance Due  Topic Date Due  . Hepatitis C Screening  Never done  . COVID-19 Vaccine (1) Never done  . COLONOSCOPY  Never done  . DEXA  SCAN  Never done  . INFLUENZA VACCINE  09/26/2019    Colorectal cancer screening: Completed FOBT completed 06/15/2019. Repeat every 1 years Mammogram status: completed 01/29/2019 Bone Density status: decline  Lung Cancer Screening: (Low Dose CT Chest recommended if Age 68-80 years, 30 pack-year currently smoking OR have quit w/in 15years.) does not qualify.   Lung Cancer Screening Referral: no  Additional Screening:  Hepatitis C Screening: does qualify; due  Vision Screening: Recommended annual ophthalmology exams for early detection of glaucoma and other disorders of the eye. Is the patient up to date with their annual eye exam?  No  Who is the provider or what is the name of the office in which the patient attends annual eye exams? [facility] If pt is not established with a provider, would they like to be referred to a provider to establish care? No .   Dental Screening: Recommended annual dental exams for proper oral hygiene  Community Resource Referral / Chronic Care Management: CRR required this visit?  No   CCM required this visit?  No      Plan:     I have personally reviewed and noted the following in the patient's chart:   . Medical and social history . Use of alcohol, tobacco  or illicit drugs  . Current medications and supplements . Functional ability and status . Nutritional status . Physical activity . Advanced directives . List of other physicians . Hospitalizations, surgeries, and ER visits in previous 12 months . Vitals . Screenings to include cognitive, depression, and falls . Referrals and appointments  In addition, I have reviewed and discussed with patient certain preventive protocols, quality metrics, and best practice recommendations. A written personalized care plan for preventive services as well as general preventive health recommendations were provided to patient.     Barb Merino, LPN   29/93/[id number]   Nurse Notes:

## 2020-02-16 ENCOUNTER — Ambulatory Visit: Payer: Medicare Other | Admitting: Family Medicine

## 2020-02-17 ENCOUNTER — Ambulatory Visit: Payer: Medicare Other | Admitting: Family Medicine

## 2020-03-01 ENCOUNTER — Telehealth: Payer: Self-pay

## 2020-03-21 ENCOUNTER — Other Ambulatory Visit: Payer: Self-pay | Admitting: Family Medicine

## 2020-03-21 ENCOUNTER — Other Ambulatory Visit: Payer: Self-pay | Admitting: Nurse Practitioner

## 2020-03-21 DIAGNOSIS — Z1231 Encounter for screening mammogram for malignant neoplasm of breast: Secondary | ICD-10-CM

## 2020-03-22 ENCOUNTER — Telehealth: Payer: Self-pay | Admitting: Licensed Clinical Social Worker

## 2020-03-22 ENCOUNTER — Telehealth: Payer: Self-pay

## 2020-03-22 NOTE — Telephone Encounter (Signed)
  Chronic Care Management    Clinical Social Work General Follow Up Note  03/22/2020 Name: Sandra Stout MRN: [id number] DOB: [birth year]  Sandra Stout is a 75 y.o. year old female who is a primary care patient of [name], DO. The CCM team was consulted for assistance with Mental Health Counseling and Resources.   Review of patient status, including review of consultants reports, relevant laboratory and other test results, and collaboration with appropriate care team members and the patient's provider was performed as part of comprehensive patient evaluation and provision of chronic care management services.    LCSW completed CCM outreach attempt today but was unable to reach patient successfully. A HIPPA compliant voice message was left encouraging patient to return call once available. LCSW will ask Scheduling Care Guide to reschedule CCM SW appointment with patient as well.  Outpatient Encounter Medications as of 03/22/2020  Medication Sig  . ALPRAZolam (XANAX) 0.5 MG tablet Take 0.5 mg by mouth 2 (two) times daily.   [name] co-enzyme Q-10 50 MG capsule Take 50 mg by mouth daily.  . DULoxetine (CYMBALTA) 60 MG capsule Take 1 capsule (60 mg total) by mouth daily.  . Lidocaine 4 % PTCH Apply 1 patch topically every 12 (twelve) hours. (Patient not taking: Reported on 01/10/2020)  . methylphenidate (RITALIN) 20 MG tablet Take 20 mg by mouth 4 (four) times daily.   . naproxen (NAPROSYN) 500 MG tablet Take 1 tablet (500 mg total) by mouth 2 (two) times daily with a meal. (Patient not taking: Reported on 01/10/2020)  . Omega-3 Fatty Acids (FISH OIL) 1000 MG CAPS Take by mouth.  . Vitamins-Lipotropics (BALANCED B-50) TABS Take by mouth.   No facility-administered encounter medications on file as of 03/22/2020.    Follow Up Plan: Scheduling Care Guide will reach out to patient to reschedule appointment.   Dickie La, BSW, MSW, LCSW [facility] Care Management    [facility] [email or web address] Phone: [phone]

## 2020-04-04 ENCOUNTER — Telehealth: Payer: Self-pay

## 2020-04-04 NOTE — Chronic Care Management (AMB) (Signed)
  Care Management   Note  04/04/2020 Name: Sandra Stout MRN: [id number] DOB: [birth year]  [name] is a 75 y.o. year old female who is a primary care patient of [name], DO and is actively engaged with the care management team. I reached out to [name] by phone today to assist with re-scheduling a follow up visit with the Licensed Clinical Social Worker  Follow up plan: Unsuccessful telephone outreach attempt made. A HIPAA compliant phone message was left for the patient providing contact information and requesting a return call.  The care management team will reach out to the patient again over the next 7 days.  If patient returns call to provider office, please advise to call Embedded Care Management Care Guide Penne Lash  at [phone]  Penne Lash, RMA Care Guide, Embedded Care Coordination [facility]  [place], [place] [address] Direct Dial: [phone] [email or web address] Website: [email or web address]

## 2020-04-12 ENCOUNTER — Other Ambulatory Visit: Payer: Self-pay

## 2020-04-12 ENCOUNTER — Ambulatory Visit
Admission: RE | Admit: 2020-04-12 | Discharge: 2020-04-12 | Disposition: A | Discharge status: Home / Self Care | Payer: Medicare Other | Source: Ambulatory Visit | Attending: Family Medicine | Admitting: Family Medicine

## 2020-04-12 DIAGNOSIS — Z1231 Encounter for screening mammogram for malignant neoplasm of breast: Secondary | ICD-10-CM | POA: Diagnosis not present

## 2020-05-03 NOTE — Chronic Care Management (AMB) (Signed)
  Care Management   Note  05/03/2020 Name: [name] MRN: [id number] DOB: August 31, 1945  Sandra Stout is a 75 y.o. year old female who is a primary care patient of [name], DO and is actively engaged with the care management team. I reached out to [name] by phone today to assist with re-scheduling a follow up visit with the Licensed Clinical Social Worker  Follow up plan: Unsuccessful telephone outreach attempt made. The care management team will reach out to the patient again over the next 7 days.  If patient returns call to provider office, please advise to call Embedded Care Management Care Guide Penne Lash  at [phone]  Penne Lash, RMA Care Guide, Embedded Care Coordination [facility]  [place], [place] [address] Direct Dial: [phone] [email or web address] Website: [email or web address]

## 2020-05-16 NOTE — Chronic Care Management (AMB) (Signed)
  Care Management   Note  05/16/2020 Name: Sandra Stout MRN: [id number] DOB: October 24, 1945  Sandra Stout is a 75 y.o. year old female who is a primary care patient of [name], DO and is actively engaged with the care management team. I reached out to [name] by phone today to assist with re-scheduling a follow up visit with the Licensed Clinical Social Worker  Follow up plan: Telephone appointment with care management team member scheduled for:06/15/2020  Penne Lash, RMA Care Guide, Embedded Care Coordination [facility]  [place], [place] [address] Direct Dial: [phone] [email or web address] Website: [email or web address]

## 2020-06-15 ENCOUNTER — Ambulatory Visit (INDEPENDENT_AMBULATORY_CARE_PROVIDER_SITE_OTHER): Payer: Medicare Other | Admitting: Licensed Clinical Social Worker

## 2020-06-15 DIAGNOSIS — F339 Major depressive disorder, recurrent, unspecified: Secondary | ICD-10-CM

## 2020-06-15 DIAGNOSIS — F419 Anxiety disorder, unspecified: Secondary | ICD-10-CM

## 2020-06-16 NOTE — Chronic Care Management (AMB) (Signed)
    Clinical Social Work  Chronic Care Management   Phone Outreach    06/15/2020 Name: Sandra Stout MRN: [id number] DOB: 1945/06/04  Sandra Stout is a 75 y.o. year old female who is a primary care patient of [name], DO .   CCM LCSW reached out to patient today by phone to introduce self, assess needs and offer Care Management services and interventions.    Unable to keep phone appointment today and requested to reschedule.  Plan:Appointment was rescheduled CCM LCSW for 06/22/20  Review of patient status, including review of consultants reports, relevant laboratory and other test results, and collaboration with appropriate care team members and the patient's provider was performed as part of comprehensive patient evaluation and provision of care management services.    Jenel Lucks, MSW, LCSW [facility] Care Management [place]  [facility] [email or web address] Phone [phone] 8:22 AM

## 2020-06-22 ENCOUNTER — Ambulatory Visit: Payer: Self-pay | Admitting: Licensed Clinical Social Worker

## 2020-06-22 DIAGNOSIS — F339 Major depressive disorder, recurrent, unspecified: Secondary | ICD-10-CM

## 2020-06-22 DIAGNOSIS — F419 Anxiety disorder, unspecified: Secondary | ICD-10-CM

## 2020-06-26 NOTE — Chronic Care Management (AMB) (Signed)
Chronic Care Management    Clinical Social Work Note  06/26/2020 Name: Sandra Stout MRN: [id number] DOB: May 22, 1945  Sandra Stout is a 75 y.o. year old female who is a primary care patient of [name], DO. The CCM team was consulted to assist the patient with chronic disease management and/or care coordination needs related to: Mental Health Counseling and Resources.   Engaged with patient by telephone for follow up visit in response to provider referral for social work chronic care management and care coordination services.   Consent to Services:  The patient was given information about Chronic Care Management services, agreed to services, and gave verbal consent prior to initiation of services.  Please see initial visit note for detailed documentation.   Patient agreed to services and consent obtained.   Assessment: Patient is currently experiencing symptoms of  depression and anxiety which seems to be exacerbated by grief and financial strain. Pt is participating in medication management and grief therapy. Strategies to assist in management of mental health symptoms identified. See Care Plan below for interventions and patient self-care actives. Recent life changes /stressors: Financial Strain and Grief Recommendation: Patient may benefit from, and is in agreement to continued medication management and counseling.  Follow up Plan: Patient would like continued follow-up.  CCM LCSW will follow up with patient on 08/03/20. Patient will call office if needed prior to next encounter.   SDOH (Social Determinants of Health) assessments and interventions performed:    Advanced Directives Status: Not addressed in this encounter.  CCM Care Plan  No Known Allergies  Outpatient Encounter Medications as of 06/22/2020  Medication Sig  . ALPRAZolam (XANAX) 0.5 MG tablet Take 0.5 mg by mouth 2 (two) times daily.   [name] co-enzyme Q-10 50 MG capsule Take 50 mg by mouth daily.  . DULoxetine  (CYMBALTA) 60 MG capsule Take 1 capsule (60 mg total) by mouth daily.  . Lidocaine 4 % PTCH Apply 1 patch topically every 12 (twelve) hours. (Patient not taking: Reported on 01/10/2020)  . methylphenidate (RITALIN) 20 MG tablet Take 20 mg by mouth 4 (four) times daily.   . naproxen (NAPROSYN) 500 MG tablet Take 1 tablet (500 mg total) by mouth 2 (two) times daily with a meal. (Patient not taking: Reported on 01/10/2020)  . Omega-3 Fatty Acids (FISH OIL) 1000 MG CAPS Take by mouth.  . Vitamins-Lipotropics (BALANCED B-50) TABS Take by mouth.   No facility-administered encounter medications on file as of 06/22/2020.    Patient Active Problem List   Diagnosis Date Noted  . Recurrent falls 10/15/2018  . Depression, recurrent (HCC) 02/03/2018  . Chronic back pain 08/17/2014  . GERD (gastroesophageal reflux disease) 08/17/2014  . Anxiety   . 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 Chronic Mental Health needs related to Depression and Anxiety . Financial constraints related to housing and Mental Health Concerns   Needs Support, Education, and Care Coordination in order to meet unmet mental health needs Clinical Goal(s): Over the next 60 days, patient will work with SW to reduce or manage symptoms  of depression and grief  and increase knowledge and/or ability of: coping skills and self-management skills Clinical Interventions:  . Assessed patient's previous treatment, needs, coping skills, current treatment, support system and barriers to care-Pt reports continued difficulty managing depression triggered  by loss of grandson, in addition, to difficulty doing things she enjoyed in the past due to medical conditions. She has contacted [facility] for grief counseling and has obtained a psychiatrist in [place] (appointments are via phone) Pt states she is compliant with prescribed medications . Pt reports financial strain after having to fix multiple appliances (furnace and [facility]) within home. Currently, home allows limited items to be plugged in at the same time before breaker shuts off. Pt is open to resources to assist. She reports that she may be interested in housing resources in the future . CCM LCSW discussed with patient stages of grief. Extensive support and encouragement was provided. Pt was successful at identifying how stress negatively impacts her physical and mental health conditions.  . Other interventions: Active listening / Reflection utilized , Emotional Supportive Provided, Psychoeducation /Health Education, Brief CBT , and Provided basic mental health support, education   ; . Patient interviewed and appropriate assessments performed . Referred patient to community resources care guide team for assistance with management . Collaboration with PCP regarding development and update of comprehensive plan of care as evidenced by provider attestation and co-signature . Inter-disciplinary care team collaboration (see longitudinal plan of care) Patient Goals/Self-Care Activities: Over the next 60 days . Practice Gratitude Thinking to promote positive mood . - journal feelings and what helps to feel better or worse . - practice relaxation or meditation daily . - talk about feelings with a friend, family or spiritual advisor . - practice positive thinking and self-talk . Continue with therapy . Continue with compliance of taking medication         Jenel Lucks, MSW, LCSW [facility] Care Management [place]  [facility] [email or web address] Phone  [phone] 9:25 AM

## 2020-06-26 NOTE — Patient Instructions (Signed)
Visit Information  Goals Addressed              This Visit's Progress     Patient Stated   .  Manage Stress (pt-stated)   On track     Patient Goals/Self-Care Activities: Over the next 60 days . Practice Gratitude Thinking to promote positive mood . - journal feelings and what helps to feel better or worse . - practice relaxation or meditation daily . - talk about feelings with a friend, family or spiritual advisor . - practice positive thinking and self-talk . Continue with therapy . Continue with compliance of taking medication        Patient verbalizes understanding of instructions provided today.  Telephone follow up appointment with care management team member scheduled for: 08/03/20  Jenel Lucks, MSW, LCSW [facility] Care Management [facility]  [facility] [email or web address] Phone [phone] 9:26 AM

## 2020-06-28 DIAGNOSIS — Z79899 Other long term (current) drug therapy: Secondary | ICD-10-CM | POA: Diagnosis not present

## 2020-06-28 DIAGNOSIS — G47419 Narcolepsy without cataplexy: Secondary | ICD-10-CM | POA: Diagnosis not present

## 2020-06-30 IMAGING — DX PORTABLE CHEST - 1 VIEW
1 series · 1 of 1 positions shown · non-contrast
Comparison: February 20, 2016

CLINICAL DATA: Shortness of breath

EXAM:
PORTABLE CHEST 1 VIEW

[chest ap]
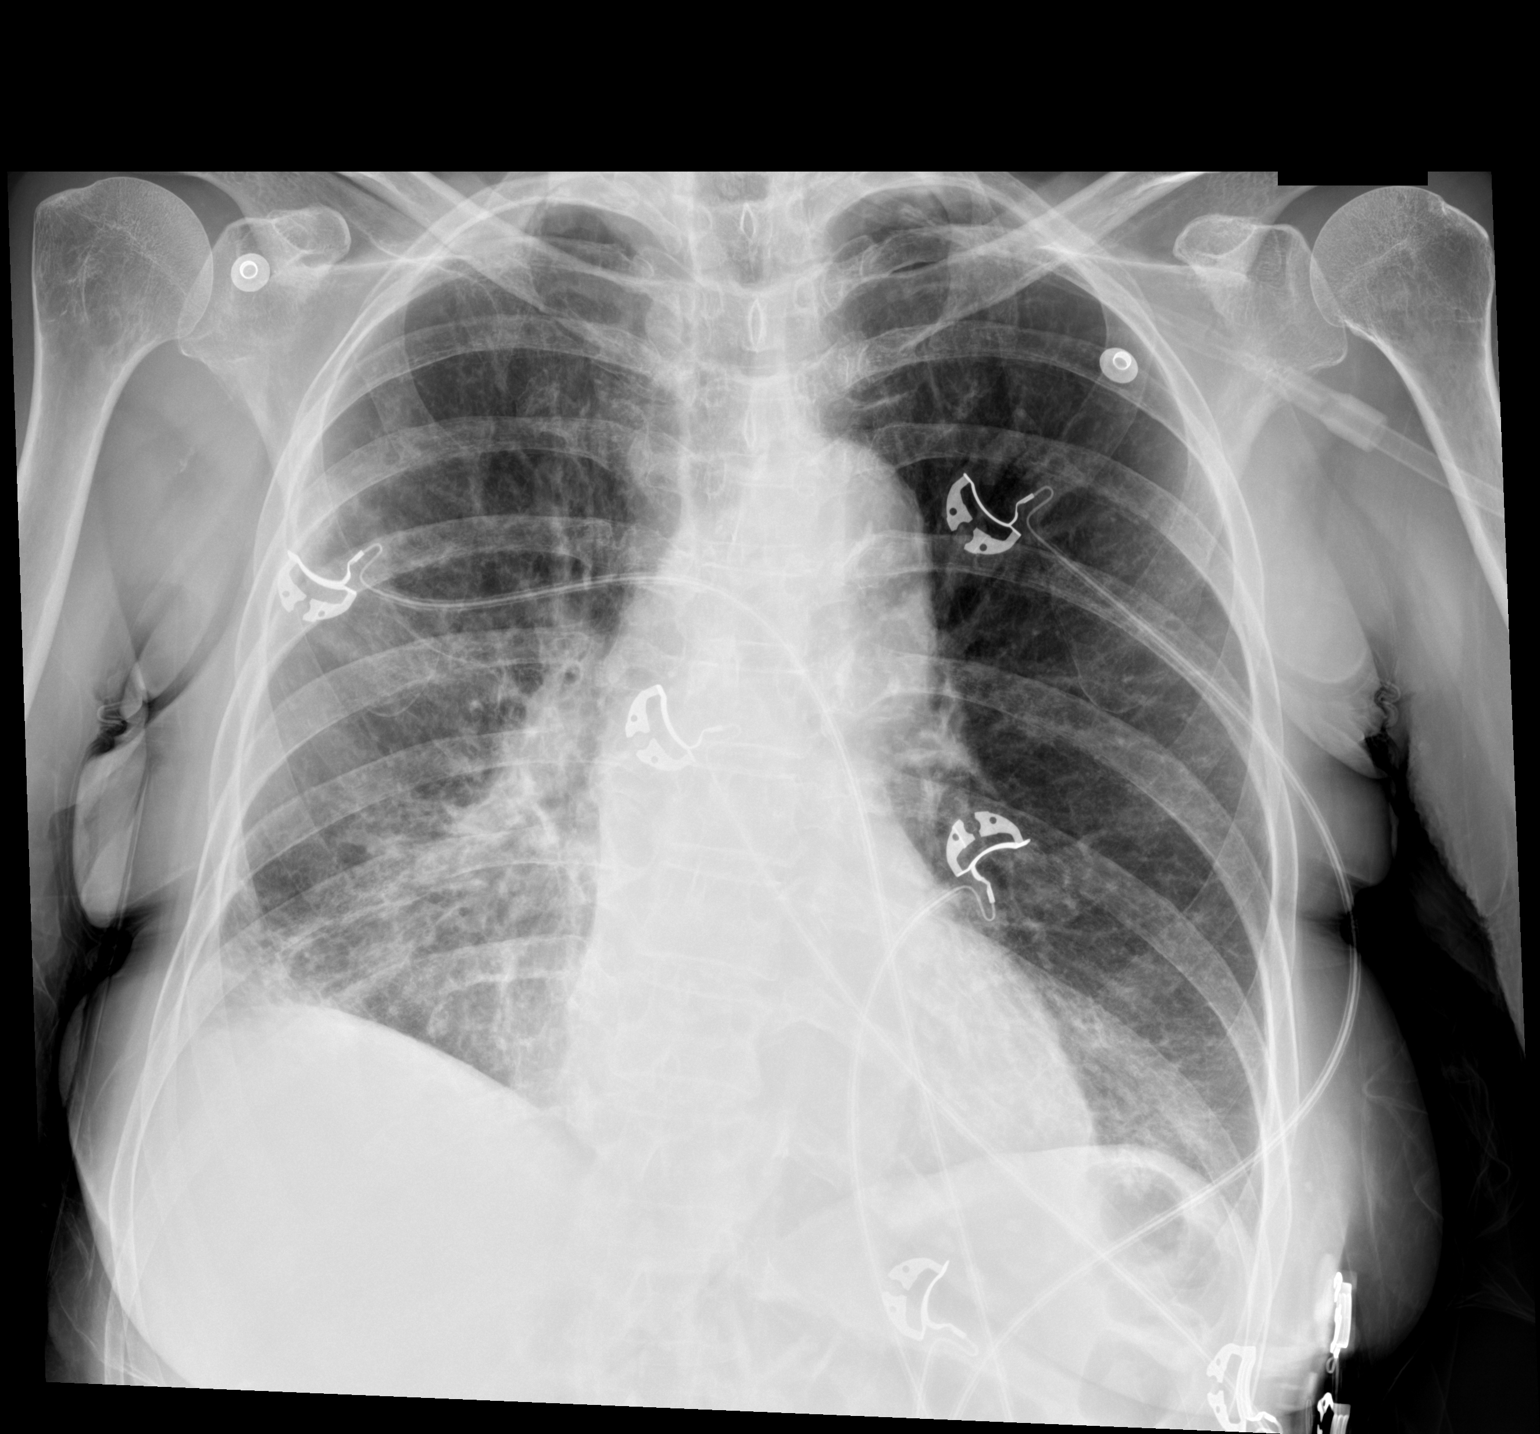

[1 of 1 positions shown; findings below may reference images not displayed]

FINDINGS: There is patchy airspace opacity seen predominantly at the right
lower lung and peripheral right upper lung. There is superimposed
chronic interstitial thickening at the bilateral lower lung bases as
on prior exam. The cardiomediastinal silhouette is unchanged. There
is atherosclerotic calcification the aortic knob with a tortuous
descending aorta. No acute osseous abnormality.
IMPRESSION: 1. Patchy airspace opacities within the right lung which are
nonspecific, but concerning for atypical infection, which includes
viral pneumonia.
2. Superimposed chronic interstitial lung changes at both lung
bases.

## 2020-08-03 ENCOUNTER — Ambulatory Visit (INDEPENDENT_AMBULATORY_CARE_PROVIDER_SITE_OTHER): Payer: Medicare Other | Admitting: Licensed Clinical Social Worker

## 2020-08-03 DIAGNOSIS — F339 Major depressive disorder, recurrent, unspecified: Secondary | ICD-10-CM | POA: Diagnosis not present

## 2020-08-03 DIAGNOSIS — F419 Anxiety disorder, unspecified: Secondary | ICD-10-CM
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
## 2020-08-03 NOTE — Patient Instructions (Signed)
Visit Information   Goals Addressed               This Visit's Progress     Patient Stated     Manage Stress (pt-stated)   On track     Patient Goals/Self-Care Activities: Over the next 60 days Practice Gratitude Thinking to promote positive mood - journal feelings and what helps to feel better or worse - practice relaxation or meditation daily - talk about feelings with a friend, family or spiritual advisor - practice positive thinking and self-talk Continue with therapy Continue with compliance of taking medication          Patient verbalizes understanding of instructions provided today and agrees to view in MyChart.   Telephone follow up appointment with care management team member scheduled for: 10/26/20  Jenel Lucks, MSW, LCSW [facility] Care Management [facility]  [facility] [email or web address] Phone [phone] 10:32 AM

## 2020-10-26 ENCOUNTER — Ambulatory Visit (INDEPENDENT_AMBULATORY_CARE_PROVIDER_SITE_OTHER): Payer: Medicare Other | Admitting: Licensed Clinical Social Worker

## 2020-10-26 DIAGNOSIS — F339 Major depressive disorder, recurrent, unspecified: Secondary | ICD-10-CM | POA: Diagnosis not present

## 2020-10-26 DIAGNOSIS — G8929 Other chronic pain: Secondary | ICD-10-CM

## 2020-10-26 DIAGNOSIS — R296 Repeated falls: Secondary | ICD-10-CM

## 2020-10-26 DIAGNOSIS — F419 Anxiety disorder, unspecified: Secondary | ICD-10-CM

## 2020-10-26 NOTE — Chronic Care Management (AMB) (Signed)
Chronic Care Management    Clinical Social Work Note  10/26/2020 Name: Sandra Stout MRN: [id number] DOB: 16-Jul-1945  Sandra Stout is a 75 y.o. year old female who is a primary care patient of [name], DO. The CCM team was consulted to assist the patient with chronic disease management and/or care coordination needs related to: Mental Health Counseling and Resources and Grief Counseling.   Engaged with patient by telephone for follow up visit in response to provider referral for social work chronic care management and care coordination services.   Consent to Services:  The patient was given information about Chronic Care Management services, agreed to services, and gave verbal consent prior to initiation of services.  Please see initial visit note for detailed documentation.   Patient agreed to services and consent obtained.   Consent to Services:  The patient was given information about Care Management services, agreed to services, and gave verbal consent prior to initiation of services.  Please see initial visit note for detailed documentation.   Patient agreed to services today and consent obtained.   Assessment: Engaged with patient by telephone in response to provider referral for social work care coordination services: Mental Health Counseling and Resources.    Patient continues to maintain positive progress with care plan goals. She reports difficulty managing grief and concerns about the anniversary of loss. Strategies to assist with management of symptoms discussed and identified. Patient continues to participate in med management. See Care Plan below for interventions and patient self-care activities.  Recent life changes or stressors: Grief  Recommendation: Patient may benefit from, and is in agreement work with LCSW to address care coordination needs and will continue to work with the clinical team to address health care and disease management related needs.   Follow up  Plan: Patient would like continued follow-up from CCM LCSW .  per patient's request will follow up in 12/28/20.  Will call office if needed prior to next encounter.   SDOH (Social Determinants of Health) assessments and interventions performed:  NA  Advanced Directives Status: Not addressed in this encounter.  CCM Care Plan  No Known Allergies  Outpatient Encounter Medications as of 10/26/2020  Medication Sig   ALPRAZolam (XANAX) 0.5 MG tablet Take 0.5 mg by mouth 2 (two) times daily.    co-enzyme Q-10 50 MG capsule Take 50 mg by mouth daily.   DULoxetine (CYMBALTA) 60 MG capsule Take 1 capsule (60 mg total) by mouth daily.   Lidocaine 4 % PTCH Apply 1 patch topically every 12 (twelve) hours. (Patient not taking: Reported on 01/10/2020)   methylphenidate (RITALIN) 20 MG tablet Take 20 mg by mouth 4 (four) times daily.    naproxen (NAPROSYN) 500 MG tablet Take 1 tablet (500 mg total) by mouth 2 (two) times daily with a meal. (Patient not taking: Reported on 01/10/2020)   Omega-3 Fatty Acids (FISH OIL) 1000 MG CAPS Take by mouth.   Vitamins-Lipotropics (BALANCED B-50) TABS Take by mouth.   No facility-administered encounter medications on file as of 10/26/2020.    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    Conditions to be addressed/monitored: Anxiety, Depression, and Grief ; Mental Health Concerns   Care Plan : Clinical Social Work  Updates made by Jenel Lucks  D, LCSW since 10/26/2020 12:00 AM     Problem: Management of Depression and Anxiety Symptoms      Goal: Coping Skills Enhanced   Start Date: 06/22/2020  This Visit's Progress: On track  Recent Progress: On track  Priority: High  Note:   Current barriers:   Chronic Mental Health needs related to  Depression and Anxiety Financial constraints related to housing and Mental Health Concerns  Needs Support, Education, and Care Coordination in order to meet unmet mental health needs Clinical Goal(s): Over the next 60 days, patient will work with SW to reduce or manage symptoms of depression and grief  and increase knowledge and/or ability of: coping skills and self-management skills Clinical Interventions:  Assessed patient's previous treatment, needs, coping skills, current treatment, support system and barriers to care Patient interviewed and appropriate assessments performed Patient endorses symptoms of depression and anxiety remaining the same. Patient states her symptoms fluctuate "day by day" Patient reports the upcoming anniversary of the loss of her grandson which triggers symptoms CCM LCSW discussed ways to establish a plan to cope with future triggers which could negatively impact pain and functioning. Patient has identified speaking with family assists in promotion of mood, as they often encourage one another Patient continues to participate in sessions with psychiatrist via phone monthly. Patient feels that he is very compassionate and has been a help with management of symptoms Patient reports compliance with medications for anxiety and to assist with sleep CCM LCSW discussed benefits of Gratitude Thinking to promote positive mood and productivity. Pt shared, "Im trying my best to think positive and focus on how things will get better" CBT and Mindfulness strategies were implemented during session Patient receives strong support from family to assist with running errands and maintaining home Patient agreed to schedule an appointment with PCP should health conditions worsen Other interventions: Active listening / Reflection utilized , Emotional Supportive Provided, Psychoeducation Estate agent, Brief CBT , and Provided basic mental health support, education    Collaboration with PCP  regarding development and update of comprehensive plan of care as evidenced by provider attestation and co-signature Inter-disciplinary care team collaboration (see longitudinal plan of care) Patient Goals/Self-Care Activities: Over the next 90 days Practice Gratitude Thinking to promote positive mood - journal feelings and what helps to feel better or worse - practice relaxation or meditation daily - talk about feelings with a friend, family or spiritual advisor - practice positive thinking and self-talk Continue with therapy Continue with compliance of taking medication        Jenel Lucks, MSW, LCSW [facility] Care Management [place]  [facility] [email or web address] Phone [phone] 1:33 PM

## 2020-10-26 NOTE — Patient Instructions (Signed)
Visit Information   Goals Addressed               This Visit's Progress     Patient Stated     Manage Stress (pt-stated)   On track     Patient Goals/Self-Care Activities: Over the next 60 days Practice Gratitude Thinking to promote positive mood - journal feelings and what helps to feel better or worse - practice relaxation or meditation daily - talk about feelings with a friend, family or spiritual advisor - practice positive thinking and self-talk Continue with therapy Continue with compliance of taking medication         Patient verbalizes understanding of instructions provided today and agrees to view in MyChart.   Telephone follow up appointment with care management team member scheduled for:12/28/20  Jenel Lucks, MSW, LCSW [facility] Care Management [facility]  [facility] [email or web address] Phone [phone] 1:46 PM

## 2020-12-28 ENCOUNTER — Ambulatory Visit (INDEPENDENT_AMBULATORY_CARE_PROVIDER_SITE_OTHER): Payer: Medicare Other | Admitting: Licensed Clinical Social Worker

## 2020-12-28 DIAGNOSIS — G47419 Narcolepsy without cataplexy: Secondary | ICD-10-CM

## 2020-12-28 DIAGNOSIS — F419 Anxiety disorder, unspecified: Secondary | ICD-10-CM

## 2020-12-28 DIAGNOSIS — F339 Major depressive disorder, recurrent, unspecified: Secondary | ICD-10-CM

## 2020-12-28 DIAGNOSIS — M545 Low back pain, unspecified: Secondary | ICD-10-CM

## 2020-12-28 DIAGNOSIS — G8929 Other chronic pain: Secondary | ICD-10-CM

## 2020-12-29 NOTE — Chronic Care Management (AMB) (Signed)
Chronic Care Management    Clinical Social Work Note  12/29/2020 Name: Sandra Stout MRN: [id number] DOB: [birth year]  Sandra Stout is a 75 y.o. year old female who is a primary care patient of [name], DO. The CCM team was consulted to assist the patient with chronic disease management and/or care coordination needs related to: Mental Health Counseling and Resources and Grief Counseling.   Engaged with patient by telephone for follow up visit in response to provider referral for social work chronic care management and care coordination services.   Consent to Services:  The patient was given information about Chronic Care Management services, agreed to services, and gave verbal consent prior to initiation of services.  Please see initial visit note for detailed documentation.   Patient agreed to services and consent obtained.   Consent to Services:  The patient was given information about Care Management services, agreed to services, and gave verbal consent prior to initiation of services.  Please see initial visit note for detailed documentation.   Patient agreed to services today and consent obtained.  Engaged with patient by phone in response to provider referral for social work care coordination services:  Assessment/Interventions:  Patient continues to maintain positive progress with care plan goals. She continues to utilize healthy coping skills and participate in med management with psychiatrist to manage symptoms. Supportive resources provided See Care Plan below for interventions and patient self-care activities.  Recent life changes or stressors: Grief  Recommendation: Patient may benefit from, and is in agreement work with LCSW to address care coordination needs and will continue to work with the clinical team to address health care and disease management related needs.   Follow up Plan: Patient would like continued follow-up from CCM LCSW .  per patient's request will  follow up in 02/02/21.  Will call office if needed prior to next encounter.  SDOH (Social Determinants of Health) assessments and interventions performed:    Advanced Directives Status: Not addressed in this encounter.  CCM Care Plan  No Known Allergies  Outpatient Encounter Medications as of 12/28/2020  Medication Sig   ALPRAZolam (XANAX) 0.5 MG tablet Take 0.5 mg by mouth 2 (two) times daily.    co-enzyme Q-10 50 MG capsule Take 50 mg by mouth daily.   DULoxetine (CYMBALTA) 60 MG capsule Take 1 capsule (60 mg total) by mouth daily.   Lidocaine 4 % PTCH Apply 1 patch topically every 12 (twelve) hours. (Patient not taking: Reported on 01/10/2020)   methylphenidate (RITALIN) 20 MG tablet Take 20 mg by mouth 4 (four) times daily.    naproxen (NAPROSYN) 500 MG tablet Take 1 tablet (500 mg total) by mouth 2 (two) times daily with a meal. (Patient not taking: Reported on 01/10/2020)   Omega-3 Fatty Acids (FISH OIL) 1000 MG CAPS Take by mouth.   Vitamins-Lipotropics (BALANCED B-50) TABS Take by mouth.   No facility-administered encounter medications on file as of 12/28/2020.    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    Conditions to be addressed/monitored: Anxiety and Depression;  Grief  Care Plan : Clinical Social Work  Updates made by Jenel Lucks D, LCSW since 12/29/2020 12:00 AM     Problem: Management of Depression and Anxiety Symptoms  Goal: Coping Skills Enhanced   Start Date: 06/22/2020  This Visit's Progress: On track  Recent Progress: On track  Priority: High  Note:   Current barriers:   Chronic Mental Health needs related to Depression and Anxiety Financial constraints related to housing and Mental Health Concerns  Needs Support,  Education, and Care Coordination in order to meet unmet mental health needs Clinical Goal(s): Over the next 60 days, patient will work with SW to reduce or manage symptoms of depression and grief  and increase knowledge and/or ability of: coping skills and self-management skills Clinical Interventions:  Assessed patient's previous treatment, needs, coping skills, current treatment, support system and barriers to care Patient interviewed and appropriate assessments performed Patient endorses symptoms of depression and anxiety remaining the same. Patient states her symptoms fluctuate "day by day" Patient reports the upcoming anniversary of the loss of her grandson which triggers symptoms 11/03: Patient reports "doing pretty good" Patient continues to experience complex emotions triggered by grief CCM LCSW discussed ways to establish a plan to cope with future triggers which could negatively impact pain and functioning. Patient has identified speaking with family assists in promotion of mood, as they often encourage one another 11/03: Patient continues to utilize strategies to assist with management of symptoms. She has keepsakes throughout home that promote positive memories of loved ones (throw pillow, photos, etc) Patient continues to participate in sessions with psychiatrist via phone monthly. Patient feels that he is very compassionate and has been a help with management of symptoms Patient reports compliance with medications for anxiety and to assist with sleep CCM LCSW discussed benefits of Gratitude Thinking to promote positive mood and productivity. Pt shared, "Im trying my best to think positive and focus on how things will get better" CBT and Mindfulness strategies were implemented during session Patient receives strong support from family to assist with running errands and maintaining home 11/03: CCM LCSW informed patient of supportive resources to combat financial stress, if ever needed. Patient  was appreciative stating that her rent has increased, although she is on a fixed income. Patient agreed to contact CCM LCSW with any needs that may arise. Patient agreed to schedule an appointment with PCP should health conditions worsen Other interventions: Active listening / Reflection utilized , Emotional Supportive Provided, Psychoeducation Estate agent, Brief CBT , and Provided basic mental health support, education    Collaboration with PCP regarding development and update of comprehensive plan of care as evidenced by provider attestation and co-signature Inter-disciplinary care team collaboration (see longitudinal plan of care) Patient Goals/Self-Care Activities: Over the next 90 days Practice Gratitude Thinking to promote positive mood Talk about feelings with a friend, family or spiritual advisor Practice positive thinking and self-talk Continue with therapy and compliance of taking medication          Jenel Lucks, MSW, LCSW [facility] Care Management [place]  [facility] [email or web address] Phone [phone] 10:00 AM

## 2020-12-29 NOTE — Patient Instructions (Signed)
Visit Information  Patient verbalizes understanding of instructions provided today and agrees to view in MyChart.   Telephone follow up appointment with care management team member scheduled for:02/02/21  Jenel Lucks, MSW, LCSW [facility] Care Management [facility]  [facility] [email or web address] Phone [phone] 10:03 AM

## 2021-01-10 ENCOUNTER — Ambulatory Visit: Payer: Medicare Other

## 2021-01-12 ENCOUNTER — Ambulatory Visit: Payer: Medicare Other

## 2021-01-15 ENCOUNTER — Ambulatory Visit: Payer: Medicare Other

## 2021-01-24 DIAGNOSIS — F339 Major depressive disorder, recurrent, unspecified: Secondary | ICD-10-CM

## 2021-02-01 ENCOUNTER — Telehealth: Payer: Medicare Other

## 2021-02-02 ENCOUNTER — Telehealth: Payer: Medicare Other

## 2021-02-02 ENCOUNTER — Telehealth: Payer: Self-pay | Admitting: Licensed Clinical Social Worker

## 2021-02-02 NOTE — Telephone Encounter (Signed)
    Clinical Social Work  Care Management   Phone Outreach    02/02/2021 Name: MARCELE KOSTA MRN: [id number] DOB: [birth year]  Sandra Stout is a 75 y.o. year old female who is a primary care patient of [name], DO .   Reason for referral: Mental Health Counseling and Resources.    F/U phone call today to assess needs, progress and barriers with care plan goals.   Telephone outreach was unsuccessful. A HIPPA compliant phone message was left for the patient providing contact information and requesting a return call.   Plan:CCM LCSW will wait for return call. If no return call is received, CCM LCSW will make another attempt within 30 days  Review of patient status, including review of consultants reports, relevant laboratory and other test results, and collaboration with appropriate care team members and the patient's provider was performed as part of comprehensive patient evaluation and provision of care management services.    Jenel Lucks, MSW, LCSW [facility] Care Management [place]  [facility] [email or web address] Phone [phone] 8:42 AM

## 2021-02-05 ENCOUNTER — Telehealth: Payer: Self-pay | Admitting: *Deleted

## 2021-02-05 ENCOUNTER — Ambulatory Visit: Payer: Medicare Other

## 2021-02-05 NOTE — Telephone Encounter (Signed)
Pt called requesting to reschedule left message for PJ at - [phone]) Pt call dropped/called back unable to leave message.   Please advise.

## 2021-02-06 NOTE — Telephone Encounter (Signed)
Unable to leave vm to reschedule visit with pt.

## 2021-02-07 NOTE — Telephone Encounter (Signed)
Appointment has been rescheduled.

## 2021-03-01 ENCOUNTER — Ambulatory Visit: Payer: Self-pay

## 2021-03-01 ENCOUNTER — Ambulatory Visit (INDEPENDENT_AMBULATORY_CARE_PROVIDER_SITE_OTHER): Payer: Medicare Other | Admitting: Licensed Clinical Social Worker

## 2021-03-01 DIAGNOSIS — F419 Anxiety disorder, unspecified: Secondary | ICD-10-CM

## 2021-03-01 DIAGNOSIS — F339 Major depressive disorder, recurrent, unspecified: Secondary | ICD-10-CM

## 2021-03-01 DIAGNOSIS — R296 Repeated falls: Secondary | ICD-10-CM

## 2021-03-01 DIAGNOSIS — G8929 Other chronic pain: Secondary | ICD-10-CM

## 2021-03-01 NOTE — Telephone Encounter (Signed)
°  Chief Complaint: wheezing Symptoms: wheezing, SOB, congestion Frequency: 3 days Pertinent Negatives: NA Disposition: [] ED /[] Urgent Care (no appt availability in office) / [x] Appointment(In office/virtual)/ []  [place] [facility]/ [] Home Care/ [] Refused Recommended Disposition /[] [place] [facility]/ []  Follow-up with PCP Additional Notes: Pt states she has been like this for 3 days now and is afraid of it turning into PNA. Attempted to schedule pt for OV but she refused and wanted a telephone call or meds sent into pharmacy. Scheduled TE for tomorrow at 1120 with Dr. . Care advice given and pt verbalized understanding. No other questions/concerns noted.     Reason for Disposition  [1] MILD difficulty breathing (e.g., minimal/no SOB at rest, SOB with walking, pulse <100) AND [2] NEW-onset or WORSE than normal  Answer Assessment - Initial Assessment Questions 1. RESPIRATORY STATUS: "Describe your breathing?" (e.g., wheezing, shortness of breath, unable to speak, severe coughing)      wheezing 2. ONSET: "When did this breathing problem begin?"      3 days 3. PATTERN "Does the difficult breathing come and go, or has it been constant since it started?"      constant 4. SEVERITY: "How bad is your breathing?" (e.g., mild, moderate, severe)    - MILD: No SOB at rest, mild SOB with walking, speaks normally in sentences, can lie down, no retractions, pulse < 100.    - MODERATE: SOB at rest, SOB with minimal exertion and prefers to sit, cannot lie down flat, speaks in phrases, mild retractions, audible wheezing, pulse 100-120.    - SEVERE: Very SOB at rest, speaks in single words, struggling to breathe, sitting hunched forward, retractions, pulse > 120      mild 9. OTHER SYMPTOMS: "Do you have any other symptoms? (e.g., dizziness, runny nose, cough, chest pain, fever)     Runny nose, cough, sneezing, congestion  Protocols used: Breathing Difficulty-A-AH

## 2021-03-02 ENCOUNTER — Ambulatory Visit (INDEPENDENT_AMBULATORY_CARE_PROVIDER_SITE_OTHER): Payer: Medicare Other | Admitting: Internal Medicine

## 2021-03-02 ENCOUNTER — Telehealth: Payer: Self-pay | Admitting: Family Medicine

## 2021-03-02 ENCOUNTER — Encounter: Payer: Self-pay | Admitting: Internal Medicine

## 2021-03-02 DIAGNOSIS — R062 Wheezing: Secondary | ICD-10-CM | POA: Diagnosis not present

## 2021-03-02 DIAGNOSIS — J069 Acute upper respiratory infection, unspecified: Secondary | ICD-10-CM

## 2021-03-02 HISTORY — DX: Wheezing: R06.2

## 2021-03-02 HISTORY — DX: Acute upper respiratory infection, unspecified: J06.9

## 2021-03-02 LAB — VERITOR FLU A/B WAIVED
Influenza A: NEGATIVE
Influenza B: NEGATIVE

## 2021-03-02 MED ORDER — AMOXICILLIN-POT CLAVULANATE 875-125 MG PO TABS: 1.0000 | ORAL_TABLET | Freq: Two times a day (BID) | ORAL | 0 refills | Status: DC

## 2021-03-02 MED ORDER — FEXOFENADINE HCL 180 MG PO TABS: 180.0000 mg | ORAL_TABLET | Freq: Every day | ORAL | 1 refills | Status: DC

## 2021-03-02 MED ORDER — ALBUTEROL SULFATE HFA 108 (90 BASE) MCG/ACT IN AERS: 2.0000 | INHALATION_SPRAY | Freq: Four times a day (QID) | RESPIRATORY_TRACT | 0 refills | Status: DC | PRN

## 2021-03-02 NOTE — Patient Instructions (Signed)
Visit Information  Thank you for taking time to visit with me today. Please don't hesitate to contact me if I can be of assistance to you before our next scheduled telephone appointment.  Following are the goals we discussed today:  Patient Goals/Self-Care Activities: Over the next 90 days Practice Gratitude Thinking to promote positive mood Talk about feelings with a friend, family or spiritual advisor Practice positive thinking and self-talk Continue with therapy and compliance of taking medication  Our next appointment is by telephone on 05/14/21 at 2:15 PM  Please call the care guide team at [phone] if you need to cancel or reschedule your appointment.   If you are experiencing a Mental Health or Behavioral Health Crisis or need someone to talk to, please call 911   Patient verbalizes understanding of instructions provided today and agrees to view in MyChart.   Jenel Lucks, MSW, LCSW [facility] Care Management [place]   [facility] [email or web address] Phone [phone] 5:36 PM

## 2021-03-02 NOTE — Telephone Encounter (Signed)
OK to dispense for 10

## 2021-03-02 NOTE — Telephone Encounter (Signed)
Routing to providers in office. RX directions are for 7 days, quantity is for 10 days. Please clarify and resend to the pharmacy.

## 2021-03-02 NOTE — Telephone Encounter (Signed)
Copied from CRM [id number]. Topic: General - Inquiry >> Mar 02, 2021 11:46 AM Daphine Deutscher D wrote: Reason for CRM: Victorino Dike with [facility] called needing clarification on the prescription of Augentin  It is written for 10 days but has instructions for 7.  CB#  [phone]

## 2021-03-02 NOTE — Progress Notes (Signed)
LMP  (LMP Unknown)    Subjective:    Patient ID: Sandra Stout, female    DOB: [birth year], 76 y.o.   MRN: [id number]  Chief Complaint  Patient presents with   Cough   Wheezing   Chest congestion   Nausea   Fatigue   Shortness of Breath         HPI: Sandra Stout is a 76 y.o. female   This visit was completed via telephone due to the restrictions of the COVID-19 pandemic. All issues as above were discussed and addressed but no physical exam was performed. If it was felt that the patient should be evaluated in the office, they were directed there. The patient verbally consented to this visit. Patient was unable to complete an audio/visual visit due to Technical difficulties. Due to the catastrophic nature of the COVID-19 pandemic, this visit was done through audio contact only. Location of the patient: home Location of the provider: work Those involved with this call:  Provider: Charlynne Cousins, MD CMA: Frazier Butt, [place] Desk/Registration: [employer]  Time spent on call: 10 minutes on the phone discussing health concerns. 10 minutes total spent in review of patient's record and preparation of their chart.  Pt says she feels under the weather. She has had wheezing, congestion in her chest.  URI  This is a new problem. The current episode started in the past 7 days. The problem has been gradually worsening. There has been no fever. Associated symptoms include coughing, rhinorrhea, sneezing, vomiting and wheezing. Pertinent negatives include no abdominal pain, chest pain, congestion, diarrhea, dysuria, ear pain, headaches, joint pain, joint swelling, nausea, neck pain, plugged ear sensation, sinus pain, sore throat or swollen glands. Associated symptoms comments: Had nausea / vomiting x 2  last night - was " sick to her stomach" .   Chief Complaint  Patient presents with   Cough   Wheezing   Chest congestion   Nausea   Fatigue   Shortness of Breath         Relevant  past medical, surgical, family and social history reviewed and updated as indicated. Interim medical history since our last visit reviewed. Allergies and medications reviewed and updated.  Review of Systems  HENT:  Positive for rhinorrhea and sneezing. Negative for congestion, ear pain, sinus pain and sore throat.   Respiratory:  Positive for cough and wheezing.   Cardiovascular:  Negative for chest pain.  Gastrointestinal:  Positive for vomiting. Negative for abdominal pain, diarrhea and nausea.  Genitourinary:  Negative for dysuria.  Musculoskeletal:  Negative for joint pain and neck pain.  Neurological:  Negative for headaches.   Per HPI unless specifically indicated above     Objective:    LMP  (LMP Unknown)   Wt Readings from Last 3 Encounters:  01/10/20 100 lb (45.4 kg)  09/09/19 99 lb (44.9 kg)  08/09/19 97 lb (44 kg)    Physical Exam  Unable to peform sec to virtual visit.   Results for orders placed or performed in visit on 08/09/19  WET PREP FOR [place], YEAST, CLUE   Specimen: Vaginal; Sterile Swab   Sterile Swab  Result Value Ref Range   Trichomonas Exam Negative Negative   Yeast Exam Negative Negative   Clue Cell Exam Negative Negative  Microscopic Examination   Urine  Result Value Ref Range   WBC, UA 0-5 0 - 5 /hpf   RBC None seen 0 - 2 /hpf   Epithelial Cells (  non renal) 0-10 0 - 10 /hpf   Bacteria, UA None seen None seen/Few  Urine Culture, Reflex   Urine  Result Value Ref Range   Urine Culture, Routine Final report    Organism ID, Bacteria Comment   UA/M w/rflx Culture, Routine   Specimen: Urine   Urine  Result Value Ref Range   Specific Gravity, UA 1.015 1.005 - 1.030   pH, UA 7.0 5.0 - 7.5   Color, UA Yellow Yellow   Appearance Ur Clear Clear   Leukocytes,UA Trace (A) Negative   Protein,UA Negative Negative/Trace   Glucose, UA Negative Negative   Ketones, UA Negative Negative   RBC, UA Negative Negative   Bilirubin, UA Negative Negative    Urobilinogen, Ur 0.2 0.2 - 1.0 mg/dL   Nitrite, UA Negative Negative   Microscopic Examination See below:    Urinalysis Reflex Comment   CBC with Differential/Platelet  Result Value Ref Range   WBC 5.8 3.4 - 10.8 x10E3/uL   RBC 4.57 3.77 - 5.28 x10E6/uL   Hemoglobin 14.5 11.1 - 15.9 g/dL   Hematocrit 44.1 34.0 - 46.6 %   MCV 97 79 - 97 fL   MCH 31.7 26.6 - 33.0 pg   MCHC 32.9 31.5 - 35.7 g/dL   RDW 12.9 11.7 - 15.4 %   Platelets 384 150 - 450 x10E3/uL   Neutrophils 47 Not Estab. %   Lymphs 42 Not Estab. %   Monocytes 9 Not Estab. %   Eos 1 Not Estab. %   Basos 1 Not Estab. %   Neutrophils Absolute 2.7 1.4 - 7.0 x10E3/uL   Lymphocytes Absolute 2.5 0.7 - 3.1 x10E3/uL   Monocytes Absolute 0.5 0.1 - 0.9 x10E3/uL   EOS (ABSOLUTE) 0.1 0.0 - 0.4 x10E3/uL   Basophils Absolute 0.0 0.0 - 0.2 x10E3/uL   Immature Granulocytes 0 Not Estab. %   Immature Grans (Abs) 0.0 0.0 - 0.1 x10E3/uL  Comprehensive metabolic panel  Result Value Ref Range   Glucose 90 65 - 99 mg/dL   BUN 10 8 - 27 mg/dL   Creatinine, Ser 0.80 0.57 - 1.00 mg/dL   GFR calc non Af Amer 72 >59 mL/min/1.73   GFR calc Af Amer 83 >59 mL/min/1.73   BUN/Creatinine Ratio 13 12 - 28   Sodium 142 134 - 144 mmol/L   Potassium 4.3 3.5 - 5.2 mmol/L   Chloride 102 96 - 106 mmol/L   CO2 22 20 - 29 mmol/L   Calcium 10.4 (H) 8.7 - 10.3 mg/dL   Total Protein 6.6 6.0 - 8.5 g/dL   Albumin 4.5 3.7 - 4.7 g/dL   Globulin, Total 2.1 1.5 - 4.5 g/dL   Albumin/Globulin Ratio 2.1 1.2 - 2.2   Bilirubin Total 0.3 0.0 - 1.2 mg/dL   Alkaline Phosphatase 136 (H) 48 - 121 IU/L   AST 47 (H) 0 - 40 IU/L   ALT 34 (H) 0 - 32 IU/L  Lipid Panel w/o Chol/HDL Ratio  Result Value Ref Range   Cholesterol, Total 257 (H) 100 - 199 mg/dL   Triglycerides 124 0 - 149 mg/dL   HDL 70 >39 mg/dL   VLDL Cholesterol Cal 22 5 - 40 mg/dL   LDL Chol Calc (NIH) 165 (H) 0 - 99 mg/dL  TSH  Result Value Ref Range   TSH 2.130 0.450 - 4.500 uIU/mL        Current  Outpatient Medications:    albuterol (VENTOLIN HFA) 108 (90 Base) MCG/ACT inhaler, Inhale 2  puffs into the lungs every 6 (six) hours as needed for wheezing or shortness of breath., Disp: 8 g, Rfl: 0   amoxicillin-clavulanate (AUGMENTIN) 875-125 MG tablet, Take 1 tablet by mouth 2 (two) times daily for 7 days., Disp: 20 tablet, Rfl: 0   fexofenadine (ALLEGRA ALLERGY) 180 MG tablet, Take 1 tablet (180 mg total) by mouth daily., Disp: 10 tablet, Rfl: 1   ALPRAZolam (XANAX) 0.5 MG tablet, Take 0.5 mg by mouth 2 (two) times daily. , Disp: , Rfl:    co-enzyme Q-10 50 MG capsule, Take 50 mg by mouth daily., Disp: , Rfl:    DULoxetine (CYMBALTA) 60 MG capsule, Take 1 capsule (60 mg total) by mouth daily., Disp: 30 capsule, Rfl: 3   Lidocaine 4 % PTCH, Apply 1 patch topically every 12 (twelve) hours. (Patient not taking: Reported on 01/10/2020), Disp: 60 patch, Rfl: 3   methylphenidate (RITALIN) 20 MG tablet, Take 20 mg by mouth 4 (four) times daily. , Disp: , Rfl:    naproxen (NAPROSYN) 500 MG tablet, Take 1 tablet (500 mg total) by mouth 2 (two) times daily with a meal. (Patient not taking: Reported on 01/10/2020), Disp: 180 tablet, Rfl: 3   Omega-3 Fatty Acids (FISH OIL) 1000 MG CAPS, Take by mouth., Disp: , Rfl:    Vitamins-Lipotropics (BALANCED B-50) TABS, Take by mouth., Disp: , Rfl:     Assessment & Plan:  URI CHECK FOR COVID AND FLU  START PT ON AUGMENTIN and ventolin Increase fluid intake.  Headahce - tyelnol every 4-6 hrs prn and alternate this with ibubrufen 800 mg q 8 hrly. Sinus pressure: use steam inhalation.  OTC -  Allegra / claritin.  Pt will need to come in wait in the car and get swabbed for flu and COVID test today  Pt verbalized understanding of such, to get to the office at today and get a curb side test for the above. Will check CXR    Problem List Items Addressed This Visit       Respiratory   Upper respiratory tract infection - Primary   Relevant Orders   DG Chest 2  View   Novel Coronavirus, NAA ([facility])   Veritor Flu A/B Waived     Other   Wheezing   Relevant Orders   Novel Coronavirus, NAA ([facility])   Veritor Flu A/B Waived     Orders Placed This Encounter  Procedures   Novel Coronavirus, NAA ([facility])   DG Chest 2 View   Veritor Flu A/B Waived     Meds ordered this encounter  Medications   albuterol (VENTOLIN HFA) 108 (90 Base) MCG/ACT inhaler    Sig: Inhale 2 puffs into the lungs every 6 (six) hours as needed for wheezing or shortness of breath.    Dispense:  8 g    Refill:  0   amoxicillin-clavulanate (AUGMENTIN) 875-125 MG tablet    Sig: Take 1 tablet by mouth 2 (two) times daily for 7 days.    Dispense:  20 tablet    Refill:  0   fexofenadine (ALLEGRA ALLERGY) 180 MG tablet    Sig: Take 1 tablet (180 mg total) by mouth daily.    Dispense:  10 tablet    Refill:  1     Follow up plan: No follow-ups on file.

## 2021-03-02 NOTE — Chronic Care Management (AMB) (Signed)
Chronic Care Management    Clinical Social Work Note  03/02/2021 Name: Sandra Stout MRN: [id number] DOB: July 31, 1945  Sandra Stout is a 76 y.o. year old female who is a primary care patient of [name], DO. The CCM team was consulted to assist the patient with chronic disease management and/or care coordination needs related to: Mental Health Counseling and Resources.   Engaged with patient by telephone for follow up visit in response to provider referral for social work chronic care management and care coordination services.   Consent to Services:  The patient was given information about Chronic Care Management services, agreed to services, and gave verbal consent prior to initiation of services.  Please see initial visit note for detailed documentation.   Patient agreed to services and consent obtained.   Assessment: Review of patient past medical history, allergies, medications, and health status, including review of relevant consultants reports was performed today as part of a comprehensive evaluation and provision of chronic care management and care coordination services.     SDOH (Social Determinants of Health) assessments and interventions performed:    Advanced Directives Status: Not addressed in this encounter.  CCM Care Plan  No Known Allergies  Outpatient Encounter Medications as of 03/01/2021  Medication Sig   ALPRAZolam (XANAX) 0.5 MG tablet Take 0.5 mg by mouth 2 (two) times daily.    co-enzyme Q-10 50 MG capsule Take 50 mg by mouth daily.   DULoxetine (CYMBALTA) 60 MG capsule Take 1 capsule (60 mg total) by mouth daily.   Lidocaine 4 % PTCH Apply 1 patch topically every 12 (twelve) hours. (Patient not taking: Reported on 01/10/2020)   methylphenidate (RITALIN) 20 MG tablet Take 20 mg by mouth 4 (four) times daily.    naproxen (NAPROSYN) 500 MG tablet Take 1 tablet (500 mg total) by mouth 2 (two) times daily with a meal. (Patient not taking: Reported on 01/10/2020)    Omega-3 Fatty Acids (FISH OIL) 1000 MG CAPS Take by mouth.   Vitamins-Lipotropics (BALANCED B-50) TABS Take by mouth.   No facility-administered encounter medications on file as of 03/01/2021.    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    Conditions to be addressed/monitored: Anxiety and Depression  Care Plan : Clinical Social Work  Updates made by Jenel Lucks D, LCSW since 03/02/2021 12:00 AM     Problem: Management of Depression and Anxiety Symptoms      Goal: Coping Skills Enhanced   Start Date: 06/22/2020  This Visit's Progress: On track  Recent Progress: On track  Priority: High  Note:   Current barriers:   Chronic Mental Health needs related to Depression and Anxiety Financial constraints related to housing and Mental Health Concerns  Needs Support, Education, and Care Coordination in order to meet unmet mental health needs Clinical Goal(s): Over the next 60 days, patient will work with SW to reduce or manage symptoms of depression and grief  and increase knowledge and/or ability of: coping skills and self-management skills Clinical Interventions:  Assessed patient's previous treatment, needs, coping skills, current treatment, support system and barriers to care Patient interviewed and appropriate assessments performed Patient endorses symptoms of depression and anxiety remaining the same. Patient states  her symptoms fluctuate "day by day" Patient reports the upcoming anniversary of the loss of her grandson which triggers symptoms 11/03: Patient reports "doing pretty good" Patient continues to experience complex emotions triggered by grief 1/5: Patient is feeling ill, states she  has the flu. Pt is taking OTC medications and prioritizing rest CCM LCSW discussed ways to establish a plan to cope with future triggers which could negatively impact pain and functioning. Patient has identified speaking with family assists in promotion of mood, as they often encourage one another 11/03: Patient continues to utilize strategies to assist with management of symptoms. She has keepsakes throughout home that promote positive memories of loved ones (throw pillow, photos, etc) Patient continues to participate in sessions with psychiatrist via phone monthly. Patient feels that he is very compassionate and has been a help with management of symptoms Patient reports compliance with medications for anxiety and to assist with sleep CCM LCSW discussed benefits of Gratitude Thinking to promote positive mood and productivity. Pt shared, "Im trying my best to think positive and focus on how things will get better" CBT and Mindfulness strategies were implemented during session Patient receives strong support from family to assist with running errands and maintaining home 11/03: CCM LCSW informed patient of supportive resources to combat financial stress, if ever needed. Patient was appreciative stating that her rent has increased, although she is on a fixed income. Patient agreed to contact CCM LCSW with any needs that may arise. Patient agreed to schedule an appointment with PCP should health conditions worsen Other interventions: Active listening / Reflection utilized , Emotional Supportive Provided, Psychoeducation Estate agent, Brief CBT , and Provided basic mental health support, education    Collaboration with PCP regarding development and update of comprehensive plan of care as evidenced by provider attestation and co-signature Inter-disciplinary care team collaboration (see longitudinal plan of care) Patient Goals/Self-Care Activities: Over the next 90 days Practice Gratitude Thinking to  promote positive mood Talk about feelings with a friend, family or spiritual advisor Practice positive thinking and self-talk Continue with therapy and compliance of taking medication          Jenel Lucks, MSW, LCSW [facility] Line   [facility] [email or web address] Phone [phone] 5:35 PM

## 2021-03-03 LAB — SARS-COV-2, NAA 2 DAY TAT

## 2021-03-03 LAB — NOVEL CORONAVIRUS, NAA: SARS-CoV-2, NAA: NOT DETECTED

## 2021-03-05 NOTE — Progress Notes (Signed)
Please let pt know this was normal.

## 2021-03-07 ENCOUNTER — Inpatient Hospital Stay
Admission: EM | Admit: 2021-03-07 | Discharge: 2021-03-12 | DRG: 193 | Disposition: A | Discharge status: Skilled Nursing Facility | Payer: Medicare Other | Attending: Family Medicine | Admitting: Family Medicine

## 2021-03-07 ENCOUNTER — Emergency Department: Payer: Medicare Other

## 2021-03-07 DIAGNOSIS — G928 Other toxic encephalopathy: Secondary | ICD-10-CM | POA: Diagnosis present

## 2021-03-07 DIAGNOSIS — Z8052 Family history of malignant neoplasm of bladder: Secondary | ICD-10-CM | POA: Diagnosis not present

## 2021-03-07 DIAGNOSIS — R531 Weakness: Secondary | ICD-10-CM | POA: Diagnosis not present

## 2021-03-07 DIAGNOSIS — E785 Hyperlipidemia, unspecified: Secondary | ICD-10-CM | POA: Diagnosis not present

## 2021-03-07 DIAGNOSIS — G8929 Other chronic pain: Secondary | ICD-10-CM | POA: Diagnosis present

## 2021-03-07 DIAGNOSIS — Z8349 Family history of other endocrine, nutritional and metabolic diseases: Secondary | ICD-10-CM | POA: Diagnosis not present

## 2021-03-07 DIAGNOSIS — F339 Major depressive disorder, recurrent, unspecified: Secondary | ICD-10-CM | POA: Diagnosis not present

## 2021-03-07 DIAGNOSIS — J309 Allergic rhinitis, unspecified: Secondary | ICD-10-CM | POA: Diagnosis not present

## 2021-03-07 DIAGNOSIS — Z79899 Other long term (current) drug therapy: Secondary | ICD-10-CM | POA: Diagnosis not present

## 2021-03-07 DIAGNOSIS — J9601 Acute respiratory failure with hypoxia: Secondary | ICD-10-CM

## 2021-03-07 DIAGNOSIS — F13239 Sedative, hypnotic or anxiolytic dependence with withdrawal, unspecified: Secondary | ICD-10-CM | POA: Diagnosis present

## 2021-03-07 DIAGNOSIS — E876 Hypokalemia: Secondary | ICD-10-CM | POA: Diagnosis not present

## 2021-03-07 DIAGNOSIS — G47419 Narcolepsy without cataplexy: Secondary | ICD-10-CM | POA: Diagnosis not present

## 2021-03-07 DIAGNOSIS — J441 Chronic obstructive pulmonary disease with (acute) exacerbation: Secondary | ICD-10-CM | POA: Diagnosis not present

## 2021-03-07 DIAGNOSIS — J189 Pneumonia, unspecified organism: Secondary | ICD-10-CM | POA: Diagnosis present

## 2021-03-07 DIAGNOSIS — R270 Ataxia, unspecified: Secondary | ICD-10-CM | POA: Diagnosis not present

## 2021-03-07 DIAGNOSIS — E569 Vitamin deficiency, unspecified: Secondary | ICD-10-CM | POA: Diagnosis not present

## 2021-03-07 DIAGNOSIS — Z20822 Contact with and (suspected) exposure to covid-19: Secondary | ICD-10-CM | POA: Diagnosis present

## 2021-03-07 DIAGNOSIS — F32A Depression, unspecified: Secondary | ICD-10-CM | POA: Diagnosis present

## 2021-03-07 DIAGNOSIS — Z836 Family history of other diseases of the respiratory system: Secondary | ICD-10-CM

## 2021-03-07 DIAGNOSIS — K219 Gastro-esophageal reflux disease without esophagitis: Secondary | ICD-10-CM | POA: Diagnosis present

## 2021-03-07 DIAGNOSIS — R5383 Other fatigue: Secondary | ICD-10-CM | POA: Diagnosis not present

## 2021-03-07 DIAGNOSIS — J440 Chronic obstructive pulmonary disease with acute lower respiratory infection: Secondary | ICD-10-CM | POA: Diagnosis not present

## 2021-03-07 DIAGNOSIS — F17210 Nicotine dependence, cigarettes, uncomplicated: Secondary | ICD-10-CM | POA: Diagnosis present

## 2021-03-07 DIAGNOSIS — J449 Chronic obstructive pulmonary disease, unspecified: Secondary | ICD-10-CM | POA: Diagnosis not present

## 2021-03-07 DIAGNOSIS — M545 Low back pain, unspecified: Secondary | ICD-10-CM | POA: Diagnosis not present

## 2021-03-07 DIAGNOSIS — G4489 Other headache syndrome: Secondary | ICD-10-CM | POA: Diagnosis not present

## 2021-03-07 DIAGNOSIS — G9341 Metabolic encephalopathy: Secondary | ICD-10-CM | POA: Diagnosis present

## 2021-03-07 DIAGNOSIS — F331 Major depressive disorder, recurrent, moderate: Secondary | ICD-10-CM | POA: Diagnosis present

## 2021-03-07 DIAGNOSIS — F0390 Unspecified dementia without behavioral disturbance: Secondary | ICD-10-CM | POA: Diagnosis present

## 2021-03-07 DIAGNOSIS — R0602 Shortness of breath: Secondary | ICD-10-CM

## 2021-03-07 DIAGNOSIS — Z736 Limitation of activities due to disability: Secondary | ICD-10-CM | POA: Diagnosis not present

## 2021-03-07 DIAGNOSIS — A419 Sepsis, unspecified organism: Secondary | ICD-10-CM | POA: Diagnosis not present

## 2021-03-07 DIAGNOSIS — R0689 Other abnormalities of breathing: Secondary | ICD-10-CM | POA: Diagnosis not present

## 2021-03-07 DIAGNOSIS — Z8249 Family history of ischemic heart disease and other diseases of the circulatory system: Secondary | ICD-10-CM

## 2021-03-07 DIAGNOSIS — Z743 Need for continuous supervision: Secondary | ICD-10-CM | POA: Diagnosis not present

## 2021-03-07 DIAGNOSIS — R488 Other symbolic dysfunctions: Secondary | ICD-10-CM | POA: Diagnosis not present

## 2021-03-07 DIAGNOSIS — M6259 Muscle wasting and atrophy, not elsewhere classified, multiple sites: Secondary | ICD-10-CM | POA: Diagnosis not present

## 2021-03-07 HISTORY — DX: Pneumonia, unspecified organism: J18.9

## 2021-03-07 LAB — RESP PANEL BY RT-PCR (FLU A&B, COVID) ARPGX2
Influenza A by PCR: NEGATIVE
Influenza B by PCR: NEGATIVE
SARS Coronavirus 2 by RT PCR: NEGATIVE

## 2021-03-07 LAB — CBC WITH DIFFERENTIAL/PLATELET
Abs Immature Granulocytes: 0.14 10*3/uL — ABNORMAL HIGH (ref 0.00–0.07)
Basophils Absolute: 0.1 10*3/uL (ref 0.0–0.1)
Basophils Relative: 0 %
Eosinophils Absolute: 0 10*3/uL (ref 0.0–0.5)
Eosinophils Relative: 0 %
HCT: 35.4 % — ABNORMAL LOW (ref 36.0–46.0)
Hemoglobin: 11.8 g/dL — ABNORMAL LOW (ref 12.0–15.0)
Immature Granulocytes: 1 %
Lymphocytes Relative: 12 %
Lymphs Abs: 1.5 10*3/uL (ref 0.7–4.0)
MCH: 31.1 pg (ref 26.0–34.0)
MCHC: 33.3 g/dL (ref 30.0–36.0)
MCV: 93.4 fL (ref 80.0–100.0)
Monocytes Absolute: 1.4 10*3/uL — ABNORMAL HIGH (ref 0.1–1.0)
Monocytes Relative: 11 %
Neutro Abs: 9.6 10*3/uL — ABNORMAL HIGH (ref 1.7–7.7)
Neutrophils Relative %: 76 %
Platelets: 474 10*3/uL — ABNORMAL HIGH (ref 150–400)
RBC: 3.79 MIL/uL — ABNORMAL LOW (ref 3.87–5.11)
RDW: 13 % (ref 11.5–15.5)
WBC: 12.6 10*3/uL — ABNORMAL HIGH (ref 4.0–10.5)
nRBC: 0 % (ref 0.0–0.2)

## 2021-03-07 LAB — BASIC METABOLIC PANEL
Anion gap: 10 (ref 5–15)
BUN: 15 mg/dL (ref 8–23)
CO2: 27 mmol/L (ref 22–32)
Calcium: 9 mg/dL (ref 8.9–10.3)
Chloride: 99 mmol/L (ref 98–111)
Creatinine, Ser: 0.75 mg/dL (ref 0.44–1.00)
GFR, Estimated: >60 mL/min (ref >60)
Glucose, Bld: 103 mg/dL — ABNORMAL HIGH (ref 70–99)
Potassium: 3 mmol/L — ABNORMAL LOW (ref 3.5–5.1)
Sodium: 136 mmol/L (ref 135–145)

## 2021-03-07 LAB — TROPONIN I (HIGH SENSITIVITY): Troponin I (High Sensitivity): 8 ng/L (ref <18)

## 2021-03-07 MED ORDER — ALPRAZOLAM 0.5 MG PO TABS
1.0000 mg | ORAL_TABLET | Freq: Two times a day (BID) | ORAL | Status: DC
  Administered 2021-03-08: 1 mg via ORAL
  Filled 2021-03-07: qty 2

## 2021-03-07 MED ORDER — ACETAMINOPHEN 650 MG RE SUPP: 650.0000 mg | Freq: Four times a day (QID) | RECTAL | Status: DC | PRN

## 2021-03-07 MED ORDER — ONDANSETRON HCL 4 MG/2ML IJ SOLN: 4.0000 mg | Freq: Four times a day (QID) | INTRAMUSCULAR | Status: DC | PRN

## 2021-03-07 MED ORDER — MAGNESIUM HYDROXIDE 400 MG/5ML PO SUSP: 30.0000 mL | Freq: Every day | ORAL | Status: DC | PRN

## 2021-03-07 MED ORDER — POTASSIUM CHLORIDE IN NACL 20-0.9 MEQ/L-% IV SOLN
INTRAVENOUS | Status: DC
  Filled 2021-03-07 (×2): qty 1000

## 2021-03-07 MED ORDER — ENOXAPARIN SODIUM 40 MG/0.4ML IJ SOSY
40.0000 mg | PREFILLED_SYRINGE | INTRAMUSCULAR | Status: DC
  Administered 2021-03-08 – 2021-03-12 (×5): 40 mg via SUBCUTANEOUS
  Filled 2021-03-07 (×5): qty 0.4

## 2021-03-07 MED ORDER — ACETAMINOPHEN 325 MG PO TABS
650.0000 mg | ORAL_TABLET | Freq: Four times a day (QID) | ORAL | Status: DC | PRN
  Administered 2021-03-07 – 2021-03-09 (×3): 650 mg via ORAL
  Filled 2021-03-07 (×3): qty 2

## 2021-03-07 MED ORDER — OMEGA-3-ACID ETHYL ESTERS 1 G PO CAPS
1.0000 g | ORAL_CAPSULE | Freq: Every day | ORAL | Status: DC
  Administered 2021-03-08 – 2021-03-12 (×5): 1 g via ORAL
  Filled 2021-03-07 (×5): qty 1

## 2021-03-07 MED ORDER — METHYLPHENIDATE HCL 5 MG PO TABS
20.0000 mg | ORAL_TABLET | Freq: Four times a day (QID) | ORAL | Status: DC
  Administered 2021-03-08 (×2): 20 mg via ORAL
  Filled 2021-03-07 (×2): qty 4

## 2021-03-07 MED ORDER — SODIUM CHLORIDE 0.9 % IV SOLN
1.0000 g | Freq: Once | INTRAVENOUS | Status: AC
  Administered 2021-03-07: 1 g via INTRAVENOUS
  Filled 2021-03-07: qty 10

## 2021-03-07 MED ORDER — SODIUM CHLORIDE 0.9 % IV SOLN
500.0000 mg | Freq: Once | INTRAVENOUS | Status: DC
  Filled 2021-03-07: qty 5

## 2021-03-07 MED ORDER — POTASSIUM CHLORIDE 20 MEQ PO PACK: 40.0000 meq | PACK | Freq: Once | ORAL | Status: DC

## 2021-03-07 MED ORDER — ONDANSETRON HCL 4 MG PO TABS: 4.0000 mg | ORAL_TABLET | Freq: Four times a day (QID) | ORAL | Status: DC | PRN

## 2021-03-07 MED ORDER — LACTATED RINGERS IV BOLUS
1000.0000 mL | Freq: Once | INTRAVENOUS | Status: AC
  Administered 2021-03-07: 1000 mL via INTRAVENOUS

## 2021-03-07 MED ORDER — LORATADINE 10 MG PO TABS
10.0000 mg | ORAL_TABLET | Freq: Every day | ORAL | Status: DC
  Administered 2021-03-08 – 2021-03-12 (×5): 10 mg via ORAL
  Filled 2021-03-07 (×5): qty 1

## 2021-03-07 MED ORDER — TRAZODONE HCL 50 MG PO TABS
25.0000 mg | ORAL_TABLET | Freq: Every evening | ORAL | Status: DC | PRN
  Administered 2021-03-08 – 2021-03-11 (×3): 25 mg via ORAL
  Filled 2021-03-07 (×4): qty 1

## 2021-03-07 MED ORDER — CO-ENZYME Q-10 50 MG PO CAPS: 50.0000 mg | ORAL_CAPSULE | Freq: Every day | ORAL | Status: DC

## 2021-03-07 MED ORDER — POTASSIUM CHLORIDE CRYS ER 20 MEQ PO TBCR
40.0000 meq | EXTENDED_RELEASE_TABLET | Freq: Once | ORAL | Status: AC
  Administered 2021-03-07: 40 meq via ORAL
  Filled 2021-03-07: qty 2

## 2021-03-07 MED ORDER — DULOXETINE HCL 30 MG PO CPEP
60.0000 mg | ORAL_CAPSULE | Freq: Every day | ORAL | Status: DC
  Administered 2021-03-08 – 2021-03-09 (×2): 60 mg via ORAL
  Filled 2021-03-07: qty 1
  Filled 2021-03-07: qty 2

## 2021-03-07 MED ORDER — SODIUM CHLORIDE 0.9 % IV SOLN
500.0000 mg | INTRAVENOUS | Status: DC
  Administered 2021-03-07 – 2021-03-08 (×2): 500 mg via INTRAVENOUS
  Filled 2021-03-07: qty 500
  Filled 2021-03-07: qty 5

## 2021-03-07 MED ORDER — SODIUM CHLORIDE 0.9 % IV SOLN
2.0000 g | INTRAVENOUS | Status: AC
  Administered 2021-03-08 – 2021-03-11 (×4): 2 g via INTRAVENOUS
  Filled 2021-03-07 (×4): qty 20

## 2021-03-07 NOTE — ED Notes (Signed)
Pt notified Tammy  called asking an update about her, Pt verbalized can give information to Tammy, called back @ [phone] , call went to voicemail

## 2021-03-07 NOTE — ED Provider Notes (Signed)
[facility] Provider Note    Event Date/Time   First MD Initiated Contact with Patient 03/07/21 2034     (approximate)   History   Chief Complaint Shortness of Breath   HPI  Sandra Stout is a 76 y.o. female with past medical history of GERD and depression who presents to the ED complaining of shortness of breath.  Patient reports that she has had 3 to 4 days of cough productive of yellowish sputum along with generalized weakness and malaise.  She reports subjective fevers and chills, but has not taken her temperature at home.  She states she has been feeling increasingly short of breath but she denies any pain in her chest and has not noticed any pain or swelling in her legs.  She has not yet been tested for COVID-19.  She states that her weakness has progressed to the point that her legs give out on her and she has to crawl on the ground at times to get around.     Physical Exam   Triage Vital Signs: ED Triage Vitals  Enc Vitals Group     BP 03/07/21 2033 118/73     Pulse Rate 03/07/21 2033 (!) 107     Resp 03/07/21 2033 (!) 23     Temp 03/07/21 2033 98.2 F (36.8 C)     Temp Source 03/07/21 2033 Oral     SpO2 03/07/21 2033 95 %     Weight 03/07/21 2036 115 lb (52.2 kg)     Height --      Head Circumference --      Peak Flow --      Pain Score --      Pain Loc --      Pain Edu? --      Excl. in GC? --     Most recent vital signs: Vitals:   03/07/21 2215 03/07/21 2230  BP:  119/70  Pulse: (!) 101 (!) 101  Resp: (!) 24 (!) 28  Temp:    SpO2: 94% 95%    Constitutional: Alert and oriented. Eyes: Conjunctivae are normal. Head: Atraumatic. Nose: No congestion/rhinnorhea. Mouth/Throat: Mucous membranes are moist.  Cardiovascular: Normal rate, regular rhythm. Grossly normal heart sounds.  2+ radial pulses bilaterally. Respiratory: Tachypneic with increased respiratory effort.  No retractions. Lungs CTAB. Gastrointestinal: Soft and  nontender. No distention. Musculoskeletal: No lower extremity tenderness nor edema.  Neurologic:  Normal speech and language. No gross focal neurologic deficits are appreciated.    ED Results / Procedures / Treatments   Labs (all labs ordered are listed, but only abnormal results are displayed) Labs Reviewed  CBC WITH DIFFERENTIAL/PLATELET - Abnormal; Notable for the following components:      Result Value   WBC 12.6 (*)    RBC 3.79 (*)    Hemoglobin 11.8 (*)    HCT 35.4 (*)    Platelets 474 (*)    Neutro Abs 9.6 (*)    Monocytes Absolute 1.4 (*)    Abs Immature Granulocytes 0.14 (*)    All other components within normal limits  BASIC METABOLIC PANEL - Abnormal; Notable for the following components:   Potassium 3.0 (*)    Glucose, Bld 103 (*)    All other components within normal limits  RESP PANEL BY RT-PCR (FLU A&B, COVID) ARPGX2  CULTURE, BLOOD (ROUTINE X 2)  CULTURE, BLOOD (ROUTINE X 2)  TROPONIN I (HIGH SENSITIVITY)  TROPONIN I (HIGH SENSITIVITY)     EKG  ED ECG REPORT I, Chesley Noon, the attending physician, personally viewed and interpreted this ECG.   Date: 03/07/2021  EKG Time: 20:39  Rate: 105  Rhythm: sinus tachycardia  Axis: Normal  Intervals:none  ST&T Change: None  RADIOLOGY Chest x-ray reviewed by me with right-sided infiltrate, potentially chronic per radiology, no edema or effusion noted.  PROCEDURES:  Critical Care performed: Yes, see critical care procedure note(s)  .Critical Care Performed by: Chesley Noon, MD Authorized by: Chesley Noon, MD   Critical care provider statement:    Critical care time (minutes):  30   Critical care time was exclusive of:  Teaching time and separately billable procedures and treating other patients   Critical care was necessary to treat or prevent imminent or life-threatening deterioration of the following conditions:  Sepsis   Critical care was time spent personally by me on the following  activities:  Development of treatment plan with patient or surrogate, discussions with consultants, evaluation of patient's response to treatment, examination of patient, ordering and review of laboratory studies, ordering and review of radiographic studies, ordering and performing treatments and interventions, pulse oximetry, re-evaluation of patient's condition and review of old charts   I assumed direction of critical care for this patient from another provider in my specialty: no     Care discussed with: admitting provider     MEDICATIONS ORDERED IN ED: Medications  potassium chloride SA (KLOR-CON M) CR tablet 40 mEq (has no administration in time range)  cefTRIAXone (ROCEPHIN) 1 g in sodium chloride 0.9 % 100 mL IVPB (has no administration in time range)  azithromycin (ZITHROMAX) 500 mg in sodium chloride 0.9 % 250 mL IVPB (has no administration in time range)  lactated ringers bolus 1,000 mL (has no administration in time range)     IMPRESSION / MDM / ASSESSMENT AND PLAN / ED COURSE  I reviewed the triage vital signs and the nursing notes.                              76 y.o. female with past medical history of GERD and depression who presents to the ED complaining of productive cough, generalized weakness, malaise, and increasing difficulty breathing over the past 3 to 4 days.  Differential diagnosis includes, but is not limited to, pneumonia, COVID-19, influenza, sepsis, COPD, CHF, ACS, and PE.  Patient is nontoxic-appearing and in no acute distress, however vital signs are concerning for tachycardia and tachypnea.  Chest x-ray reviewed by me and shows possible right-sided infiltrate with right hilar distortion, potentially chronic but I suspect her presenting acute pneumonia given clinical picture.  Patient meets sepsis criteria and we will hydrate with IV fluids, treated with Rocephin and azithromycin for community-acquired pneumonia.  Testing for COVID-19 is negative and remainder  of labs are unremarkable, CBC and BMP show no anemia or electrolyte abnormality.  EKG shows no evidence of arrhythmia or ischemia and troponin is within normal limits, low suspicion for ACS or PE.  Case discussed with hospitalist for admission.       FINAL CLINICAL IMPRESSION(S) / ED DIAGNOSES   Final diagnoses:  Shortness of breath  Generalized weakness  Community acquired pneumonia, unspecified laterality     Rx / DC Orders   ED Discharge Orders     None        Note:  This document was prepared using Dragon voice recognition software and may include unintentional dictation errors.   Chesley Noon,  MD 03/07/21 2304

## 2021-03-07 NOTE — Progress Notes (Signed)
PHARMACIST - PHYSICIAN ORDER COMMUNICATION  CONCERNING: P&T Medication Policy on Herbal Medications  DESCRIPTION:  This patients order for:  co-enzyme Q-10 capsule 50 mg  has been noted.  This product(s) is classified as an herbal or natural product. Due to a lack of definitive safety studies or FDA approval, nonstandard manufacturing practices, plus the potential risk of unknown drug-drug interactions while on inpatient medications, the Pharmacy and Therapeutics Committee does not permit the use of herbal or natural products of this type within [facility].   ACTION TAKEN: The pharmacy department is unable to verify this order at this time. Please reevaluate patients clinical condition at discharge and address if the herbal or natural product(s) should be resumed at that time.   Renda Rolls, PharmD, [facility] 03/07/2021 11:40 PM

## 2021-03-07 NOTE — ED Triage Notes (Signed)
Pt arrived via EMS. Per EMS pt had a 02 sat on RA at 92% on their arrival to pt house. EMS placed 2l/min via [place] and pt sat came up to <95%. Pt shows no sign of distress at this time and is resting on stretcher.

## 2021-03-07 NOTE — H&P (Signed)
[place]   PATIENT NAME: Sandra Stout    MR#:  [id number]  DATE OF BIRTH:  January 11, 1946  DATE OF ADMISSION:  03/07/2021  PRIMARY CARE PHYSICIAN: Valerie Roys, DO   Patient is coming from: Home  REQUESTING/REFERRING PHYSICIAN: Blake Divine, MD  CHIEF COMPLAINT:   Chief Complaint  Patient presents with   Shortness of Breath    HISTORY OF PRESENT ILLNESS:  Sandra Stout is a 76 y.o. Caucasian female with medical history significant for anxiety and panic attacks, ADD, narcolepsy, GERD and chronic back pain, who presented to the ER with acute onset of cough over the last 3 to 4 days with associated dyspnea and expectoration of yellowish sputum as well as low-grade fever and chills.  She denies any nausea or vomiting or abdominal pain.  She admits to dysuria, urinary frequency and urgency.  She has been having low back pain.  No chest pain or palpitations.  No headache or dizziness or blurred vision.  ED Course: When she came to the ER heart rate was 104 and respiratory rate was 20 with a pulse oximetry of 95% on room air and 89% that was up to 92% on 1 L of O2 by nasal cannula with otherwise normal vital signs.  Labs revealed hypokalemia of 3 with otherwise normal BMP.  CBC showed leukocytosis of 12.6 and hemoglobin of 11.8 with hematocrit 35.4.  Platelets were elevated at 474.    EKG as reviewed by me : EKG showed sinus tachycardia with a rate of 105 with Q waves anteroseptally with T wave inversion. Imaging: Chest x-ray showed mild right greater than left diffuse reticular interstitial opacity due to chronic lung disease with suggestion of right hilar distortion and potential scarring.  The patient was given IV Rocephin and Zithromax, 40 mill [place] p.o. potassium chloride and 1 L bolus of IV lactated Ringer's.  She will be admitted to a medical bed for further evaluation and management.   PAST MEDICAL HISTORY:   Past Medical History:  Diagnosis Date   Acute respiratory  failure with hypoxia ([place]) 10/06/2018   ADD (attention deficit disorder)    Anxiety    Community acquired pneumonia 10/06/2018   Hemorrhoid    Hypokalemia    Narcolepsy    Panic attacks    Pneumonia     PAST SURGICAL HISTORY:   Past Surgical History:  Procedure Laterality Date   TUBAL LIGATION      SOCIAL HISTORY:   Social History   Tobacco Use   Smoking status: Every Day    Packs/day: 0.50    Types: Cigarettes   Smokeless tobacco: Never  Substance Use Topics   Alcohol use: No    FAMILY HISTORY:   Family History  Problem Relation Age of Onset   Lung disease Mother    Thyroid disease Sister    Lung disease Sister    Cancer Sister        Bladder   Heart disease Father    Breast cancer Neg Hx     DRUG ALLERGIES:  No Known Allergies  REVIEW OF SYSTEMS:   ROS As per history of present illness. All pertinent systems were reviewed above. Constitutional, HEENT, cardiovascular, respiratory, GI, GU, musculoskeletal, neuro, psychiatric, endocrine, integumentary and hematologic systems were reviewed and are otherwise negative/unremarkable except for positive findings mentioned above in the HPI.   MEDICATIONS AT HOME:   Prior to Admission medications   Medication Sig Start Date End Date Taking? Authorizing  Provider  albuterol (VENTOLIN HFA) 108 (90 Base) MCG/ACT inhaler Inhale 2 puffs into the lungs every 6 (six) hours as needed for wheezing or shortness of breath. 03/02/21   Vigg, Avanti, MD  ALPRAZolam Duanne Moron) 0.5 MG tablet Take 0.5 mg by mouth 2 (two) times daily.  06/20/14   [provider]  amoxicillin-clavulanate (AUGMENTIN) 875-125 MG tablet Take 1 tablet by mouth 2 (two) times daily for 7 days. 03/02/21 03/09/21  Jon Billings, NP  co-enzyme Q-10 50 MG capsule Take 50 mg by mouth daily.    [provider]  DULoxetine (CYMBALTA) 60 MG capsule Take 1 capsule (60 mg total) by mouth daily. 11/30/19   Eulogio Bear, NP  fexofenadine [facility]  ALLERGY) 180 MG tablet Take 1 tablet (180 mg total) by mouth daily. 03/02/21   Vigg, Avanti, MD  Lidocaine 4 % PTCH Apply 1 patch topically every 12 (twelve) hours. Patient not taking: Reported on 01/10/2020 08/09/19   Park Liter P, DO  methylphenidate (RITALIN) 20 MG tablet Take 20 mg by mouth 4 (four) times daily.     [provider]  naproxen (NAPROSYN) 500 MG tablet Take 1 tablet (500 mg total) by mouth 2 (two) times daily with a meal. Patient not taking: Reported on 01/10/2020 09/09/19   Park Liter P, DO  Omega-3 Fatty Acids (FISH OIL) 1000 MG CAPS Take by mouth.    [provider]  Vitamins-Lipotropics (BALANCED B-50) TABS Take by mouth.    [provider]      VITAL SIGNS:  Blood pressure 119/70, pulse (!) 101, temperature 98.2 F (36.8 C), temperature source Oral, resp. rate (!) 28, weight 52.2 kg, SpO2 95 %.  PHYSICAL EXAMINATION:  Physical Exam  GENERAL:  76 y.o.-year-old Caucasian female patient lying in the bed with no acute distress.  EYES: Pupils equal, round, reactive to light and accommodation. No scleral icterus. Extraocular muscles intact.  HEENT: Head atraumatic, normocephalic. Oropharynx and nasopharynx clear.  NECK:  Supple, no jugular venous distention. No thyroid enlargement, no tenderness.  LUNGS: Slightly diminished bibasilar breath sounds with bibasal crackles.  No use of accessory muscles of respiration.  CARDIOVASCULAR: Regular rate and rhythm, S1, S2 normal. No murmurs, rubs, or gallops.  ABDOMEN: Soft, nondistended, nontender. Bowel sounds present. No organomegaly or mass.  EXTREMITIES: No pedal edema, cyanosis, or clubbing.  NEUROLOGIC: Cranial nerves II through XII are intact. Muscle strength 5/5 in all extremities. Sensation intact. Gait not checked.  PSYCHIATRIC: The patient is alert and oriented x 3.  Normal affect and good eye contact. SKIN: No obvious rash, lesion, or ulcer.   LABORATORY PANEL:   CBC Recent Labs   Lab 03/07/21 2050  WBC 12.6*  HGB 11.8*  HCT 35.4*  PLT 474*   ------------------------------------------------------------------------------------------------------------------  Chemistries  Recent Labs  Lab 03/07/21 2050  NA 136  K 3.0*  CL 99  CO2 27  GLUCOSE 103*  BUN 15  CREATININE 0.75  CALCIUM 9.0   ------------------------------------------------------------------------------------------------------------------  Cardiac Enzymes No results for input(s): TROPONINI in the last 168 hours. ------------------------------------------------------------------------------------------------------------------  RADIOLOGY:  DG Chest 2 View  Result Date: 03/07/2021 CLINICAL DATA:  Shortness of breath EXAM: CHEST - 2 VIEW COMPARISON:  10/06/2018, CT 02/03/2014 FINDINGS: Trace right pleural effusion or thickening. Mild right greater than left reticular interstitial opacity likely due to chronic lung disease. No acute confluent airspace disease. Normal cardiomediastinal silhouette with aortic atherosclerosis. Suggestion of right hilar distortion. IMPRESSION: 1. Mild right greater than left diffuse reticular interstitial opacity likely  due to chronic lung disease. Suggestion of right hilar distortion, potentially scarring though could correlate with CT to exclude nodule. 2. There is no acute confluent airspace disease. Electronically Signed   By: Donavan Foil M.D.   On: 03/07/2021 21:37      IMPRESSION AND PLAN:  Principal Problem:   CAP (community acquired pneumonia)  1.  Community-acquired pneumonia with subsequent sepsis as manifested by tachycardia, tachypnea and leukocytosis.  The patient has subsequent acute hypoxic respiratory failure. - The patient will be admitted to a medical monitored bed. - We will follow blood and sputum cultures. - We will continue antibiotic therapy with IV Rocephin and Zithromax. - She will be hydrated with IV normal saline. - Mucolytic therapy as  well as bronchodilator therapy will be provided. - O2 protocol will be followed.  2.  Hypokalemia. - Potassium will be replaced and magnesium level will be checked.  3.  Dysuria and urinary frequency. - We will obtain a urinalysis +/- urine culture and sensitivity.  4.  Anxiety. - Continue Xanax.  DVT prophylaxis: Lovenox. Code Status: full code. Family Communication:  The plan of care was discussed in details with the patient (and family). I answered all questions. The patient agreed to proceed with the above mentioned plan. Further management will depend upon hospital course. Disposition Plan: Back to previous home environment Consults called: none. All the records are reviewed and case discussed with ED provider.  Status is: Inpatient   At the time of the admission, it appears that the appropriate admission status for this patient is inpatient.  This is judged to be reasonable and necessary in order to provide the required intensity of service to ensure the patient's safety given the presenting symptoms, physical exam findings and initial radiographic and laboratory data in the context of comorbid conditions.  The patient requires inpatient status due to high intensity of service, high risk of further deterioration and high frequency of surveillance required.  I certify that at the time of admission, it is my clinical judgment that the patient will require inpatient hospital care extending more than 2 midnights.                            Dispo: The patient is from: Home              Anticipated d/c is to: Home              Patient currently is not medically stable to d/c.              Difficult to place patient: No      Christel Mormon M.D on 03/07/2021 at 11:09 PM  [facility]   From 7 PM-7 AM, contact night-coverage [email or web address]  CC: Primary care physician; Valerie Roys, DO

## 2021-03-08 DIAGNOSIS — E876 Hypokalemia: Secondary | ICD-10-CM | POA: Diagnosis present

## 2021-03-08 DIAGNOSIS — F339 Major depressive disorder, recurrent, unspecified: Secondary | ICD-10-CM | POA: Diagnosis not present

## 2021-03-08 DIAGNOSIS — J189 Pneumonia, unspecified organism: Secondary | ICD-10-CM | POA: Diagnosis not present

## 2021-03-08 HISTORY — DX: Hypokalemia: E87.6

## 2021-03-08 LAB — CBC
HCT: 35.4 % — ABNORMAL LOW (ref 36.0–46.0)
Hemoglobin: 11.8 g/dL — ABNORMAL LOW (ref 12.0–15.0)
MCH: 31.3 pg (ref 26.0–34.0)
MCHC: 33.3 g/dL (ref 30.0–36.0)
MCV: 93.9 fL (ref 80.0–100.0)
Platelets: 455 10*3/uL — ABNORMAL HIGH (ref 150–400)
RBC: 3.77 MIL/uL — ABNORMAL LOW (ref 3.87–5.11)
RDW: 13.1 % (ref 11.5–15.5)
WBC: 11.1 10*3/uL — ABNORMAL HIGH (ref 4.0–10.5)
nRBC: 0 % (ref 0.0–0.2)

## 2021-03-08 LAB — PROTIME-INR
INR: 1.1 (ref 0.8–1.2)
Prothrombin Time: 14.4 seconds (ref 11.4–15.2)

## 2021-03-08 LAB — BASIC METABOLIC PANEL
Anion gap: 6 (ref 5–15)
BUN: 16 mg/dL (ref 8–23)
CO2: 24 mmol/L (ref 22–32)
Calcium: 8.5 mg/dL — ABNORMAL LOW (ref 8.9–10.3)
Chloride: 107 mmol/L (ref 98–111)
Creatinine, Ser: 0.61 mg/dL (ref 0.44–1.00)
GFR, Estimated: >60 mL/min (ref >60)
Glucose, Bld: 91 mg/dL (ref 70–99)
Potassium: 3.9 mmol/L (ref 3.5–5.1)
Sodium: 137 mmol/L (ref 135–145)

## 2021-03-08 LAB — LACTIC ACID, PLASMA
Lactic Acid, Venous: 0.7 mmol/L (ref 0.5–1.9)
Lactic Acid, Venous: 1.1 mmol/L (ref 0.5–1.9)

## 2021-03-08 LAB — APTT: aPTT: 37 seconds — ABNORMAL HIGH (ref 24–36)

## 2021-03-08 LAB — TROPONIN I (HIGH SENSITIVITY): Troponin I (High Sensitivity): 3 ng/L (ref <18)

## 2021-03-08 MED ORDER — CLONAZEPAM 0.25 MG PO TBDP
0.2500 mg | ORAL_TABLET | Freq: Every day | ORAL | Status: DC
  Administered 2021-03-08 – 2021-03-12 (×5): 0.25 mg via ORAL
  Filled 2021-03-08 (×6): qty 1

## 2021-03-08 MED ORDER — SODIUM CHLORIDE 0.9 % IV SOLN: 500.0000 mg | INTRAVENOUS | Status: DC

## 2021-03-08 MED ORDER — HYDROCOD POLST-CPM POLST ER 10-8 MG/5ML PO SUER
5.0000 mL | Freq: Two times a day (BID) | ORAL | Status: DC | PRN
  Administered 2021-03-08: 5 mL via ORAL
  Filled 2021-03-08: qty 5

## 2021-03-08 MED ORDER — SODIUM CHLORIDE 0.9 % IV SOLN: 2.0000 g | INTRAVENOUS | Status: DC

## 2021-03-08 MED ORDER — CLONAZEPAM 0.5 MG PO TABS: 0.2500 mg | ORAL_TABLET | Freq: Every day | ORAL | Status: DC

## 2021-03-08 MED ORDER — GUAIFENESIN ER 600 MG PO TB12
600.0000 mg | ORAL_TABLET | Freq: Two times a day (BID) | ORAL | Status: DC
  Administered 2021-03-08 – 2021-03-12 (×9): 600 mg via ORAL
  Filled 2021-03-08 (×9): qty 1

## 2021-03-08 NOTE — ED Notes (Signed)
Per Dr. Maryfrances Bunnell, take pt off monitors & prn adapt IV.

## 2021-03-08 NOTE — ED Notes (Addendum)
RN and EDT at bedside. Pt repositioned in bed and HOB raised to allow medication administration.

## 2021-03-08 NOTE — Sepsis Progress Note (Signed)
Notified bedside nurse of need to follow up on lactic acid.  Order is in, but still showing that it needs to be collected.

## 2021-03-08 NOTE — Progress Notes (Signed)
[facility] PROGRESS NOTE    Sandra Stout  [id number] DOB: [birth year] DOA: 03/07/2021 PCP: Valerie Roys, DO      Brief Narrative:   Sandra Stout is a 76 y.o. Caucasian female with medical history significant for anxiety and panic attacks, ADD, narcolepsy, GERD and chronic back pain, who presented to the ER with acute onset of cough over the last 3 to 4 days with associated dyspnea and expectoration of yellowish sputum as well as low-grade fever and chills  In the ER, heart rate was 104 and respiratory rate was 20 with a pulse oximetry of 95% on room air and 89% that was up to 92% on 1 L of O2 .  CXR showed mild right greater than left diffuse reticular interstitial opacity due to chronic lung disease with suggestion of right hilar distortion and potential scarring.  The patient was given IV Rocephin and Zithromax       Assessment & Plan:  1.  Severe sepsis due to Community-acquired pneumonia  As manifested by tachycardia, tachypnea and leukocytosis.  The patient has subsequent acute hypoxic respiratory failure and encephaloipathy -Continue Rocephin -Continue PUlmonary toilet  2.  Acute metabolic encephalopathy At baseline, patient is independent, has occasional sundowing, but is not as confused and disoriented as at present.  Likely from infection, possibly with Xanax withdrawal or Ritalin excess contributing - Hold Ritalin, Xanax - Start low dose clonazepam to prevent withdrawals  3. Hypokalemia. - Supplement K  4.  Dysuria and urinary frequency. - We will obtain a urinalysis +/- urine culture and sensitivity.  5.  Anxiety. - Hold Xanax - Start low dose clonazepam to prevent withdrawla -Continue Duloxetine        Disposition: Status is: Inpatient  Remains inpatient appropriate because: She remains confused and delirous       Level of care: Med-Surg       MDM: The below labs and imaging reports were reviewed and summarized  above.  Medication management as above.    DVT prophylaxis: enoxaparin (LOVENOX) injection 40 mg Start: 03/08/21 0800  Code Status: Partial             Subjective: Patient is still somewhat delirious, confused.  She has had no fever.  She denies chest pain, dyspnea.  Objective: Vitals:   03/08/21 1200 03/08/21 1215 03/08/21 1330 03/08/21 1618  BP: 124/81  115/61 130/73  Pulse: (!) 104 (!) 107 (!) 104 (!) 101  Resp:   (!) 22 20  Temp:    99.8 F (37.7 C)  TempSrc:    Oral  SpO2: 92% 93% 94% 94%  Weight:      Height:        Intake/Output Summary (Last 24 hours) at 03/08/2021 1658 Last data filed at 03/08/2021 0121 Gross per 24 hour  Intake 1350 ml  Output --  Net 1350 ml   Filed Weights   03/07/21 2036  Weight: 52.2 kg    Examination: General appearance: Frail elderly adult female, awake and disheveled but in no acute distress.   HEENT: Anicteric, conjunctiva pink, lids and lashes normal. No nasal deformity, discharge, epistaxis.  Lips moist, edentulous, oropharynx dry, no oral lesions.   Skin: Warm and dry.  No jaundice.  No suspicious rashes or lesions. Cardiac: RRR, nl S1-S2, no murmurs appreciated.  No lower extremity edema  Respiratory: Normal respiratory rate and rhythm.  Coarse wheezing bilaterally, no rales Abdomen: Abdomen soft.  No TTP or guarding. No ascites, distension, hepatosplenomegaly.  MSK: No deformities or effusions. Neuro: Awake and responding to questions but confused, states she is at her friend's house, does not want to go upstairs, does not consistently follow exams, speech fluent, moves upper extremities with generalized weakness but symmetric strength Psych: Confused, not oriented except to person, attention diminished    Data Reviewed: I have personally reviewed following labs and imaging studies:  CBC: Recent Labs  Lab 03/07/21 2050 03/08/21 0447  WBC 12.6* 11.1*  NEUTROABS 9.6*  --   HGB 11.8* 11.8*  HCT 35.4* 35.4*  MCV  93.4 93.9  PLT 474* Q000111Q*   Basic Metabolic Panel: Recent Labs  Lab 03/07/21 2050 03/08/21 0447  NA 136 137  K 3.0* 3.9  CL 99 107  CO2 27 24  GLUCOSE 103* 91  BUN 15 16  CREATININE 0.75 0.61  CALCIUM 9.0 8.5*   GFR: Estimated Creatinine Clearance: 47.3 mL/min (by C-G formula based on SCr of 0.61 mg/dL). Liver Function Tests: No results for input(s): AST, ALT, ALKPHOS, BILITOT, PROT, ALBUMIN in the last 168 hours. No results for input(s): LIPASE, AMYLASE in the last 168 hours. No results for input(s): AMMONIA in the last 168 hours. Coagulation Profile: Recent Labs  Lab 03/08/21 0447  INR 1.1   Cardiac Enzymes: No results for input(s): CKTOTAL, CKMB, CKMBINDEX, TROPONINI in the last 168 hours. BNP (last 3 results) No results for input(s): PROBNP in the last 8760 hours. HbA1C: No results for input(s): HGBA1C in the last 72 hours. CBG: No results for input(s): GLUCAP in the last 168 hours. Lipid Profile: No results for input(s): CHOL, HDL, LDLCALC, TRIG, CHOLHDL, LDLDIRECT in the last 72 hours. Thyroid Function Tests: No results for input(s): TSH, T4TOTAL, FREET4, T3FREE, THYROIDAB in the last 72 hours. Anemia Panel: No results for input(s): VITAMINB12, FOLATE, FERRITIN, TIBC, IRON, RETICCTPCT in the last 72 hours. Urine analysis:    Component Value Date/Time   COLORURINE STRAW (A) 02/20/2016 2127   APPEARANCEUR Clear 08/09/2019 1418   LABSPEC 1.014 02/20/2016 2127   LABSPEC 1.017 02/03/2014 1130   PHURINE 6.0 02/20/2016 2127   GLUCOSEU Negative 08/09/2019 1418   GLUCOSEU Negative 02/03/2014 1130   HGBUR MODERATE (A) 02/20/2016 2127   BILIRUBINUR Negative 08/09/2019 1418   BILIRUBINUR Negative 02/03/2014 1130   KETONESUR 5 (A) 02/20/2016 2127   PROTEINUR Negative 08/09/2019 [place] NEGATIVE 02/20/2016 2127   NITRITE Negative 08/09/2019 1418   NITRITE NEGATIVE 02/20/2016 2127   LEUKOCYTESUR Trace (A) 08/09/2019 1418   LEUKOCYTESUR Negative 02/03/2014  1130   Sepsis Labs: @LABRCNTIP (procalcitonin:4,lacticacidven:4)  ) Recent Results (from the past 240 hour(s))  Novel Coronavirus, NAA ([facility])     Status: None   Collection Time: 03/02/21  1:41 PM   Specimen: Nasopharyngeal(NP) swabs in vial transport medium  Result Value Ref Range Status   SARS-CoV-2, NAA Not Detected Not Detected Final    Comment: This nucleic acid amplification test was developed and its performance characteristics determined by [employer]. Nucleic acid amplification tests include RT-PCR and TMA. This test has not been FDA cleared or approved. This test has been authorized by FDA under an Emergency Use Authorization (EUA). This test is only authorized for the duration of time the declaration that circumstances exist justifying the authorization of the emergency use of in vitro diagnostic tests for detection of SARS-CoV-2 virus and/or diagnosis of COVID-19 infection under section 564(b)(1) of the Act, 21 U.S.C. [id number]) (1), unless the authorization is terminated or revoked sooner. When diagnostic testing is negative, the  possibility of a false negative result should be considered in the context of a patient's recent exposures and the presence of clinical signs and symptoms consistent with COVID-19. An individual without symptoms of COVID-19 and who is not shedding SARS-CoV-2 virus wo uld expect to have a negative (not detected) result in this assay.   SARS-COV-2, NAA 2 DAY TAT     Status: None   Collection Time: 03/02/21  1:41 PM  Result Value Ref Range Status   SARS-CoV-2, NAA 2 DAY TAT Performed  Final  Resp Panel by RT-PCR (Flu A&B, Covid) Nasopharyngeal Swab     Status: None   Collection Time: 03/07/21  8:50 PM   Specimen: Nasopharyngeal Swab; Nasopharyngeal(NP) swabs in vial transport medium  Result Value Ref Range Status   SARS Coronavirus 2 by RT PCR NEGATIVE NEGATIVE Final    Comment: (NOTE) SARS-CoV-2 target nucleic acids are NOT  DETECTED.  The SARS-CoV-2 RNA is generally detectable in upper respiratory specimens during the acute phase of infection. The lowest concentration of SARS-CoV-2 viral copies this assay can detect is 138 copies/mL. A negative result does not preclude SARS-Cov-2 infection and should not be used as the sole basis for treatment or other patient management decisions. A negative result may occur with  improper specimen collection/handling, submission of specimen other than nasopharyngeal swab, presence of viral mutation(s) within the areas targeted by this assay, and inadequate number of viral copies(<138 copies/mL). A negative result must be combined with clinical observations, patient history, and epidemiological information. The expected result is Negative.  Fact Sheet for Patients:  BloggerCourse.comhttps://www.fda.gov/media/152166/download  Fact Sheet for Healthcare Providers:  SeriousBroker.ithttps://www.fda.gov/media/152162/download  This test is no t yet approved or cleared by the [place] FDA and  has been authorized for detection and/or diagnosis of SARS-CoV-2 by FDA under an Emergency Use Authorization (EUA). This EUA will remain  in effect (meaning this test can be used) for the duration of the COVID-19 declaration under Section 564(b)(1) of the Act, 21 U.S.C.section 360bbb-3(b)(1), unless the authorization is terminated  or revoked sooner.       Influenza A by PCR NEGATIVE NEGATIVE Final   Influenza B by PCR NEGATIVE NEGATIVE Final    Comment: (NOTE) The Xpert Xpress SARS-CoV-2/FLU/RSV plus assay is intended as an aid in the diagnosis of influenza from Nasopharyngeal swab specimens and should not be used as a sole basis for treatment. Nasal washings and aspirates are unacceptable for Xpert Xpress SARS-CoV-2/FLU/RSV testing.  Fact Sheet for Patients: BloggerCourse.comhttps://www.fda.gov/media/152166/download  Fact Sheet for Healthcare Providers: SeriousBroker.ithttps://www.fda.gov/media/152162/download  This test is not yet  approved or cleared by the [place] FDA and has been authorized for detection and/or diagnosis of SARS-CoV-2 by FDA under an Emergency Use Authorization (EUA). This EUA will remain in effect (meaning this test can be used) for the duration of the COVID-19 declaration under Section 564(b)(1) of the Act, 21 U.S.C. section 360bbb-3(b)(1), unless the authorization is terminated or revoked.  Performed at [facility], [address] [place], [place] [address]   Culture, blood (routine x 2)     Status: None (Preliminary result)   Collection Time: 03/07/21 11:29 PM   Specimen: BLOOD  Result Value Ref Range Status   Specimen Description BLOOD LEFT HAND  Final   Special Requests   Final    BOTTLES DRAWN AEROBIC AND ANAEROBIC Blood Culture results may not be optimal due to an inadequate volume of blood received in culture bottles   Culture   Final    NO  GROWTH < 12 HOURS Performed at [facility], [address] [place], [place] [address]    Report Status PENDING  Incomplete  Culture, blood (routine x 2)     Status: None (Preliminary result)   Collection Time: 03/07/21 11:29 PM   Specimen: BLOOD  Result Value Ref Range Status   Specimen Description BLOOD LEFT ASSIST CONTROL  Final   Special Requests   Final    BOTTLES DRAWN AEROBIC AND ANAEROBIC Blood Culture results may not be optimal due to an inadequate volume of blood received in culture bottles   Culture   Final    NO GROWTH < 12 HOURS Performed at [facility], [address] [place], [place] [address]    Report Status PENDING  Incomplete         Radiology Studies: DG Chest 2 View  Result Date: 03/07/2021 CLINICAL DATA:  Shortness of breath EXAM: CHEST - 2 VIEW COMPARISON:  10/06/2018, CT 02/03/2014 FINDINGS: Trace right pleural effusion or thickening. Mild right greater than left reticular interstitial opacity likely due to chronic lung disease. No acute confluent airspace disease. Normal  cardiomediastinal silhouette with aortic atherosclerosis. Suggestion of right hilar distortion. IMPRESSION: 1. Mild right greater than left diffuse reticular interstitial opacity likely due to chronic lung disease. Suggestion of right hilar distortion, potentially scarring though could correlate with CT to exclude nodule. 2. There is no acute confluent airspace disease. Electronically Signed   By: Donavan Foil M.D.   On: 03/07/2021 21:37        Scheduled Meds:  clonazepam  0.25 mg Oral Daily   DULoxetine  60 mg Oral Daily   enoxaparin (LOVENOX) injection  40 mg Subcutaneous Q24H   guaiFENesin  600 mg Oral BID   loratadine  10 mg Oral Daily   omega-3 acid ethyl esters  1 g Oral Daily   potassium chloride  40 mEq Oral Once   Continuous Infusions:  azithromycin Stopped (03/08/21 0121)   cefTRIAXone (ROCEPHIN)  IV       LOS: 1 day       Edwin Dada, MD [facility] 03/08/2021, 4:58 PM     Please page though [place] or Epic secure chat:  For [employer], Adult nurse

## 2021-03-08 NOTE — Hospital Course (Addendum)
Sandra Stout is a 76 y.o. F with ADD, anxiety, narcolepsy and panic attacks who presented with new cough productive of yellow sputum over the last 3 to 4 days with low-grade fever and chills as well as dysuria and low back pain.     In the ER, heart rate was 104 and respiratory rate was 20 with a pulse oximetry of 95% on room air and 89% that was up to 92% on 1 L of O2 by nasal cannula.  Leukocytosis noted.  Chest x-ray showed reticular interstitial opacity due to chronic lung disease no other focal infiltrate.  The patient was given IV Rocephin and Zithromax,

## 2021-03-08 NOTE — Assessment & Plan Note (Addendum)
No significant end organ damage (I suspect the patients hypoxia is from COPD flare), sepsis and respiratory failure ruled out. ° °-Continue Rocephin and azithromycin °

## 2021-03-08 NOTE — Sepsis Progress Note (Signed)
Following per sepsis protocol   

## 2021-03-08 NOTE — ED Notes (Signed)
This RN spoke with pts emergency contact, [name], & provided an update on pt. Per Toniann Fail, pt has "spells" of confusion which just recently started. Toniann Fail would like a call if anything happens with pt at [phone].

## 2021-03-08 NOTE — Progress Notes (Signed)
CODE SEPSIS - PHARMACY COMMUNICATION  **Broad Spectrum Antibiotics should be administered within 1 hour of Sepsis diagnosis**  Time Code Sepsis Called/Page Received: 0134  Antibiotics Ordered: Azithromycin & Ceftriaxone  Time of 1st antibiotic administration: 03/07/21 @ 2318  Otelia Sergeant, PharmD, MBA 03/08/2021 1:40 AM

## 2021-03-08 NOTE — ED Notes (Signed)
Pt given meal tray.

## 2021-03-09 DIAGNOSIS — E876 Hypokalemia: Secondary | ICD-10-CM | POA: Diagnosis not present

## 2021-03-09 DIAGNOSIS — J441 Chronic obstructive pulmonary disease with (acute) exacerbation: Secondary | ICD-10-CM

## 2021-03-09 DIAGNOSIS — G9341 Metabolic encephalopathy: Secondary | ICD-10-CM | POA: Diagnosis present

## 2021-03-09 DIAGNOSIS — J449 Chronic obstructive pulmonary disease, unspecified: Secondary | ICD-10-CM | POA: Diagnosis present

## 2021-03-09 DIAGNOSIS — F339 Major depressive disorder, recurrent, unspecified: Secondary | ICD-10-CM | POA: Diagnosis not present

## 2021-03-09 DIAGNOSIS — J189 Pneumonia, unspecified organism: Secondary | ICD-10-CM | POA: Diagnosis not present

## 2021-03-09 LAB — URINALYSIS, COMPLETE (UACMP) WITH MICROSCOPIC
Bacteria, UA: NONE SEEN
Bilirubin Urine: NEGATIVE
Glucose, UA: NEGATIVE mg/dL
Hgb urine dipstick: NEGATIVE
Ketones, ur: 5 mg/dL — AB
Leukocytes,Ua: NEGATIVE
Nitrite: NEGATIVE
Protein, ur: 30 mg/dL — AB
Specific Gravity, Urine: 1.017 (ref 1.005–1.030)
pH: 6 (ref 5.0–8.0)

## 2021-03-09 MED ORDER — ORAL CARE MOUTH RINSE
15.0000 mL | Freq: Two times a day (BID) | OROMUCOSAL | Status: DC
  Administered 2021-03-12: 15 mL via OROMUCOSAL

## 2021-03-09 MED ORDER — PREDNISONE 20 MG PO TABS
40.0000 mg | ORAL_TABLET | Freq: Every day | ORAL | Status: DC
  Administered 2021-03-10 – 2021-03-12 (×3): 40 mg via ORAL
  Filled 2021-03-09 (×3): qty 2

## 2021-03-09 MED ORDER — IPRATROPIUM-ALBUTEROL 0.5-2.5 (3) MG/3ML IN SOLN: 3.0000 mL | Freq: Two times a day (BID) | RESPIRATORY_TRACT | Status: DC

## 2021-03-09 MED ORDER — IPRATROPIUM-ALBUTEROL 0.5-2.5 (3) MG/3ML IN SOLN
3.0000 mL | Freq: Two times a day (BID) | RESPIRATORY_TRACT | Status: DC
  Administered 2021-03-09 – 2021-03-10 (×2): 3 mL via RESPIRATORY_TRACT
  Filled 2021-03-09 (×2): qty 3

## 2021-03-09 MED ORDER — AZITHROMYCIN 250 MG PO TABS
500.0000 mg | ORAL_TABLET | Freq: Every day | ORAL | Status: AC
  Administered 2021-03-09 – 2021-03-11 (×3): 500 mg via ORAL
  Filled 2021-03-09 (×3): qty 2

## 2021-03-09 NOTE — TOC Progression Note (Signed)
Transition of Care [facility]) - Progression Note    Patient Details  Name: Sandra Stout MRN: [id number] Date of Birth: 1946-01-22  [facility]) CM/SW Contact  Chapman Fitch, RN Phone Number: 03/09/2021, 2:32 PM  Clinical Narrative:    PT and OT recommending SNF Daughter agreeable to bed search,  will talk to the patient about it today when she visits.    Obtained PASRR Fl2 sent for signature Bed search initiate    Expected Discharge Plan: Home w Home Health Services Barriers to Discharge: Continued Medical Work up  Expected Discharge Plan and Services Expected Discharge Plan: Home w Home Health Services   Discharge Planning Services: CM Consult   Living arrangements for the past 2 months: Single Family Home                                       Social Determinants of Health (SDOH) Interventions    Readmission Risk Interventions No flowsheet data found.

## 2021-03-09 NOTE — Progress Notes (Signed)
°  Progress Note   Patient: Sandra Stout [id number] DOB: 29-Jun-1945 DOA: 03/07/2021     2 DOS: the patient was seen and examined on 03/09/2021   Brief hospital course: Sandra Stout is a 76 y.o. F with ADD, anxiety, narcolepsy and panic attacks who presented with new cough productive of yellow sputum over the last 3 to 4 days with low-grade fever and chills as well as dysuria and low back pain.     In the ER, heart rate was 104 and respiratory rate was 20 with a pulse oximetry of 95% on room air and 89% that was up to 92% on 1 L of O2 by nasal cannula.  Leukocytosis noted.  Chest x-ray showed reticular interstitial opacity due to chronic lung disease no other focal infiltrate.  The patient was given IV Rocephin and Zithromax,    Assessment and Plan * Right middle lobe pneumonia- (present on admission) No significant end organ damage (I suspect the patients hypoxia is from COPD flare), sepsis and respiratory failure ruled out.  -Continue Rocephin and azithromycin  Acute metabolic encephalopathy- (present on admission) This delirium seems multifactorial from Ritalin use, Xanax withdrawal with possibly some dementia underlying. -Hold duloxetine, Xanax, BuSpar, mirtazapine - Low-dose clonazepam to prevent withdrawals Delirium precautions:   -Lights and TV off, minimize interruptions at night  -Blinds open and lights on during day  -Glasses/hearing aid with patient  -Frequent reorientation  -PT/OT when able  -Avoid sedation medications/Beers list medications    COPD with acute exacerbation (HCC) No PFTs, but longtime smoker, heavy wheezing. - Start prednisone, 5 days - Start scheduled bronchodilators  Hypokalemia- (present on admission) - Supplement K  Depression, recurrent ([place])- (present on admission) Hold duloxetine, Remeron  Primary narcolepsy without cataplexy- (present on admission) -Hold Ritalin given confusion     Subjective: Patient remains quite confused.  She has  some choreoathetoid movements.  Low-grade fever overnight.  Still wheezing, coughing, awake and very ataxic with walking.  Objective Signs reviewed and remarkable for still on 3 L of oxygen, heart rate 100, T-max 100.1. General appearance: Elderly adult female, lying in bed, restless, appears confused     HEENT: Anicteric, conjunctive a pink, pupils are Arcus senilis, is hard to see if they are dilated, but they almost seem dilated, edentulous, lips normal, oropharynx dry.  No oral lesions. Skin:  Cardiac: RRR, no murmurs, no lower extremity edema Respiratory: Wheezing bilaterally, rales in the left base, coughing occasionally, has nasal cannula in place.  No respiratory distress Abdomen: Abdomen soft without tenderness palpation or guarding, no ascites or distention MSK:  Neuro: Awake, makes eye contact sometimes, follows commands intermittently, rambles sort of incoherently, speech is fluent but tangential Psych: Attention diminished, affect confused, judgment insight appear impaired    Data Reviewed:  No new labs are available  Family Communication: Daughter at the bedside  Disposition: Status is: Inpatient  Remains inpatient appropriate because: She remains quite confused, on new supplemental oxygen.  Will need to continue IV antibiotics, wait for her acute metabolic encephalopathy which seems to be medication induced to improve.  Hopefully this will improve over the next 24 to 48 hours and we can begin to transition to rehab           Author: Edwin Dada, MD 03/09/2021 4:34 PM  For on call review [email or web address].

## 2021-03-09 NOTE — Assessment & Plan Note (Signed)
Hold duloxetine, Remeron

## 2021-03-09 NOTE — Progress Notes (Signed)
PT Cancellation Note  Patient Details Name: Sandra Stout MRN: [id number] DOB: [birth year]   Cancelled Treatment:    Reason Eval/Treat Not Completed: Patient declined, no reason specified. Orders received and chart reviewed. Upon entry to room pt alert, communicating appropriately. Pt declining mobility and PT assessment at this time. Pt encouraged and educated on benefits of OOB mobility for strength/independence with further declining at this time. Pt reports being agreeable to participate in the afternoon. Will re-attempt as available.   Delphia Grates. Fairly IV, PT, DPT Physical Therapist- [place]  [facility]  03/09/2021, 8:48 AM

## 2021-03-09 NOTE — Progress Notes (Signed)
Mobility Specialist - Progress Note   03/09/21 1700  Mobility  Activity Transferred to/from [facility]  Level of Assistance Minimal assist, patient does 75% or more  Assistive Device BSC  Distance Ambulated (ft) 2 ft  Mobility Out of bed for toileting  Mobility Response Tolerated well  Mobility performed by Mobility specialist  $Mobility charge 1 Mobility    Mobility responded to bed alarm, pt lying in bed upon arrival utilizing 2L but tampering with [place]. MinA to SPT to BSC---vc for hand placement and sequencing. No output. Assistance for peri-care. Confused---needs redirection. O2 desat to 85% on RA. Cues to keep [place] in place. O2 increased to 3L with sats ranging 88-89% resting. Pt left in bed with alarm set. RN notified.    Filiberto Pinks Mobility Specialist 03/09/21, 5:25 PM

## 2021-03-09 NOTE — Progress Notes (Signed)
Spoke with daughter Toniann Fail today. [place] Spaniel pt's daughter Babette Relic and her daughter live with [place] and that [employer] all Jehieli's medications even though before her mother became confused, she stated that she wanted Toniann Fail to be her POA but they never put together the paperwork. Toniann Fail said she would be up after 5 p.m. this evening to see her mom.

## 2021-03-09 NOTE — Assessment & Plan Note (Signed)
Resolved with supplementation and starting spironolactone. 

## 2021-03-09 NOTE — Assessment & Plan Note (Signed)
-  Hold Ritalin given confusion °
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
## 2021-03-09 NOTE — Assessment & Plan Note (Signed)
No PFTs, but longtime smoker, heavy wheezing. - Start prednisone, 5 days - Start scheduled bronchodilators

## 2021-03-09 NOTE — Assessment & Plan Note (Signed)
This delirium seems multifactorial from Ritalin use, Xanax withdrawal with possibly some dementia underlying. -Hold duloxetine, Xanax, BuSpar, mirtazapine - Low-dose clonazepam to prevent withdrawals Delirium precautions:   -Lights and TV off, minimize interruptions at night  -Blinds open and lights on during day  -Glasses/hearing aid with patient  -Frequent reorientation  -PT/OT when able  -Avoid sedation medications/Beers list medications

## 2021-03-09 NOTE — Progress Notes (Signed)
Chaplain Maggie made initial visit at bedside. Pt was eager to talk. She shared concerns she has related to current housing situation and financial stresses. Storytelling and listening were central to this visit. Pt asked for prayer and spoke favorably about her parents their faith. Pt expressed that she talks with God a lot. When Chaplain asked it it is helpful she replied, "Sometimes". Pt appreciated the visit and is opened to continued support in the form of pastoral care. Chaplain expects to follow up.

## 2021-03-09 NOTE — Progress Notes (Signed)
PHARMACIST - PHYSICIAN COMMUNICATION DR:   Yaakov Guthrie CONCERNING: Antibiotic IV to Oral Route Change Policy  RECOMMENDATION: This patient is receiving azithromycin by the intravenous route.  Based on criteria approved by the Pharmacy and Therapeutics Committee, the antibiotic(s) is/are being converted to the equivalent oral dose form(s).   DESCRIPTION: These criteria include: Patient being treated for a respiratory tract infection, urinary tract infection, cellulitis or clostridium difficile associated diarrhea if on metronidazole The patient is not neutropenic and does not exhibit a GI malabsorption state The patient is eating (either orally or via tube) and/or has been taking other orally administered medications for a least 24 hours The patient is improving clinically and has a Tmax < 100.5  If you have questions about this conversion, please contact the Pharmacy Department.

## 2021-03-09 NOTE — Care Management Important Message (Signed)
Important Message  Patient Details  Name: Sandra Stout MRN: [id number] Date of Birth: [birth year]   Medicare Important Message Given:  Yes     Johnell Comings 03/09/2021, 3:53 PM

## 2021-03-09 NOTE — Evaluation (Signed)
Physical Therapy Evaluation Patient Details Name: Sandra Stout MRN: [id number] DOB: 1945-08-16 Today's Date: 03/09/2021  History of Present Illness  76 y.o. Caucasian female with medical history significant for anxiety and panic attacks, ADD, narcolepsy, GERD and chronic back pain, who presented to the ER with acute onset of cough over the last 3 to 4 days with associated dyspnea and expectoration of yellowish sputum as well as low-grade fever and chills   Clinical Impression  Pt admitted with above diagnosis. Pt received in bed agreeable to PT. Very tangential speech, hard to re-direct to keep on task. Requiring EMR and OT note for PLOF, home lay out, DME, etc. Pt confused thinking dining staff is family. Requiring re-orientation. Pt resting on 3 L/min via [place] with SPO2 > 90% at rest. Able to transfer to EOB with supervision but requires HOB maximally elevated, increased time to perform, frequent re-direction and multimodal cuing for UE/LE sequencing. Tolerating sitting EoB with close supervision but does rely intermittent minA at shoulders due to posterior lean which pt is not able to correct without assist. Despite poor hand placement on RW, pt able to stand with supervision to RW with good stability but is limited in standing tolerance to <30 sec before requesting to have a seat. Pt deferring further mobility. SPO2 on 3L/min at 88-89% with standing with HR in 90's BPM. Supervision to return to bed. All needs in reach and in place. Pt displaying deficits in motor planning, LE strength/endurance, and safe use of DME thus will benefit from STR at discharge.  Pt currently with functional limitations due to the deficits listed below (see PT Problem List). Pt will benefit from skilled PT to increase their independence and safety with mobility to allow discharge to the venue listed below.      Recommendations for follow up therapy are one component of a multi-disciplinary discharge planning process, led by the  attending physician.  Recommendations may be updated based on patient status, additional functional criteria and insurance authorization.  Follow Up Recommendations Skilled nursing-short term rehab (<3 hours/day)    Assistance Recommended at Discharge Frequent or constant Supervision/Assistance  Patient can return home with the following  A little help with walking and/or transfers;Direct supervision/assist for medications management;Direct supervision/assist for financial management;Assistance with cooking/housework;Assist for transportation;A little help with bathing/dressing/bathroom;Help with stairs or ramp for entrance    Equipment Recommendations Other (comment) (defer to next venue of care)  Recommendations for Other Services       Functional Status Assessment Patient has had a recent decline in their functional status and demonstrates the ability to make significant improvements in function in a reasonable and predictable amount of time.     Precautions / Restrictions Precautions Precautions: Fall Restrictions Weight Bearing Restrictions: No      Mobility  Bed Mobility Overal bed mobility: Needs Assistance Bed Mobility: Supine to Sit;Sit to Supine     Supine to sit: Supervision;HOB elevated Sit to supine: Supervision;HOB elevated   General bed mobility comments: INcreased time to perform, mod multimodal cuing for hand placement on bed rails and sequencing LE's towards EOB. Patient Response: Cooperative;Anxious;Restless  Transfers Overall transfer level: Needs assistance Equipment used: Rolling walker (2 wheels) Transfers: Sit to/from Stand Sit to Stand: Min guard Stand pivot transfers: Mod assist         General transfer comment: Tolerated standing < 30 seconds    Ambulation/Gait               General Gait Details: pt declining  attempts  Stairs            Wheelchair Mobility    Modified Rankin (Stroke Patients Only)       Balance  Overall balance assessment: Needs assistance Sitting-balance support: Feet supported;Bilateral upper extremity supported Sitting balance-Leahy Scale: Fair Sitting balance - Comments: Displaying posterior lean in stting requiring minA intermittently from PT to correct Postural control: Posterior lean Standing balance support: During functional activity;Reliant on assistive device for balance Standing balance-Leahy Scale: Fair Standing balance comment: able to stand to RW with UE's resting lightly for stability.                             Pertinent Vitals/Pain Pain Assessment: Faces Faces Pain Scale: Hurts even more Pain Location: low back Pain Descriptors / Indicators: Aching;Discomfort;Grimacing Pain Intervention(s): Limited activity within patient's tolerance;Monitored during session;Repositioned    Home Living Family/patient expects to be discharged to:: Private residence Living Arrangements: Alone Available Help at Discharge: Family;Available 24 hours/day Type of Home: Mobile home Home Access: Stairs to enter Entrance Stairs-Rails: Right;Left;Can reach both Entrance Stairs-Number of Steps: 4   Home Layout: One level Home Equipment: Conservation officer, nature (2 wheels)      Prior Function Prior Level of Function : Independent/Modified Independent             Mobility Comments: Pt reports ind at baseline but has felt weak and has "been falling a whole lot" ADLs Comments: Pt reports ind with self care tasks and IADLs. She states her daughter will be staying at her house and can help her if needed.     Hand Dominance        Extremity/Trunk Assessment   Upper Extremity Assessment Upper Extremity Assessment: Generalized weakness    Lower Extremity Assessment Lower Extremity Assessment: Generalized weakness    Cervical / Trunk Assessment Cervical / Trunk Assessment: Normal  Communication   Communication: No difficulties  Cognition Arousal/Alertness:  Awake/alert Behavior During Therapy: Restless;Anxious Overall Cognitive Status: No family/caregiver present to determine baseline cognitive functioning                                 General Comments: Pt confused believing employee from dining services is family. Overall cooperative and pleasant with increased time to perform.        General Comments General comments (skin integrity, edema, etc.): SPO2: Desat to 88-89% on 3 L/min [place] standing < 30 sec. HR in mid 90's BPM.    Exercises Other Exercises Other Exercises: Role of PT in acute setting   Assessment/Plan    PT Assessment Patient needs continued PT services  PT Problem List Decreased strength;Decreased knowledge of use of DME;Decreased activity tolerance;Decreased safety awareness;Decreased balance;Pain;Decreased mobility       PT Treatment Interventions DME instruction;Balance training;Gait training;Neuromuscular re-education;Stair training;Functional mobility training;Patient/family education;Therapeutic activities;Therapeutic exercise    PT Goals (Current goals can be found in the Care Plan section)  Acute Rehab PT Goals PT Goal Formulation: Patient unable to participate in goal setting    Frequency Min 2X/week     Co-evaluation               AM-PAC PT "6 Clicks" Mobility  Outcome Measure Help needed turning from your back to your side while in a flat bed without using bedrails?: A Lot Help needed moving from lying on your back to sitting on the side of a  flat bed without using bedrails?: A Lot Help needed moving to and from a bed to a chair (including a wheelchair)?: A Little Help needed standing up from a chair using your arms (e.g., wheelchair or bedside chair)?: A Little Help needed to walk in hospital room?: A Little Help needed climbing 3-5 steps with a railing? : A Lot 6 Click Score: 15    End of Session Equipment Utilized During Treatment: Gait belt;Oxygen Activity Tolerance:  Patient limited by fatigue Patient left: in bed;with call bell/phone within reach;with bed alarm set Nurse Communication: Mobility status PT Visit Diagnosis: Difficulty in walking, not elsewhere classified (R26.2);Muscle weakness (generalized) (M62.81);History of falling (Z91.81)    Time: [id number] PT Time Calculation (min) (ACUTE ONLY): 20 min   Charges:   PT Evaluation $PT Eval Moderate Complexity: Wickett M. Fairly IV, PT, DPT Physical Therapist- [facility]  03/09/2021, 2:05 PM

## 2021-03-09 NOTE — Evaluation (Signed)
Occupational Therapy Evaluation Patient Details Name: Sandra Stout MRN: [id number] DOB: March 21, 1945 Today's Date: 03/09/2021   History of Present Illness 76 y.o. Caucasian female with medical history significant for anxiety and panic attacks, ADD, narcolepsy, GERD and chronic back pain, who presented to the ER with acute onset of cough over the last 3 to 4 days with associated dyspnea and expectoration of yellowish sputum as well as low-grade fever and chills   Clinical Impression   Patient presenting with decreased Ind in self care, balance, functional moblity/transfers, endurance, and safety awareness. Patient reports being independent at baseline and living alone. PTA. She does state her daughter will be staying with her at discharge.Patient currently functioning at mod A for stand pivot transfer to [facility]. She needed assistance with balance and clothing management. Unable to ambulate or stand for self care tasks and fatigues very quickly. Pt placed on RA but drops to 80's and needing to be placed back on 2Ls to remain above 90% during session. Patient will benefit from acute OT to increase overall independence in the areas of ADLs, functional mobility, and safety awareness in order to safely discharge to next venue of care.      Recommendations for follow up therapy are one component of a multi-disciplinary discharge planning process, led by the attending physician.  Recommendations may be updated based on patient status, additional functional criteria and insurance authorization.   Follow Up Recommendations  Skilled nursing-short term rehab (<3 hours/day)    Assistance Recommended at Discharge Frequent or constant Supervision/Assistance  Patient can return home with the following A lot of help with walking and/or transfers;A lot of help with bathing/dressing/bathroom;Assistance with cooking/housework;Direct supervision/assist for medications management;Direct supervision/assist for financial  management;Assist for transportation    Functional Status Assessment  Patient has had a recent decline in their functional status and demonstrates the ability to make significant improvements in function in a reasonable and predictable amount of time.  Equipment Recommendations  Other (comment) (defer to next venue of care)       Precautions / Restrictions Precautions Precautions: Fall      Mobility Bed Mobility Overal bed mobility: Needs Assistance Bed Mobility: Supine to Sit;Sit to Supine     Supine to sit: Mod assist Sit to supine: Mod assist   General bed mobility comments: for trunk support and B LEs    Transfers Overall transfer level: Needs assistance Equipment used: 1 person hand held assist Transfers: Sit to/from Stand;Bed to chair/wheelchair/BSC Sit to Stand: Min assist Stand pivot transfers: Mod assist                Balance Overall balance assessment: Needs assistance Sitting-balance support: Feet supported Sitting balance-Leahy Scale: Good     Standing balance support: During functional activity Standing balance-Leahy Scale: Poor                             ADL either performed or assessed with clinical judgement   ADL Overall ADL's : Needs assistance/impaired     Grooming: Wash/dry hands;Wash/dry face;Sitting;Set up;Supervision/safety                   Toilet Transfer: Moderate assistance;BSC/3in1;Stand-pivot;Ambulation   Toileting- Clothing Manipulation and Hygiene: Moderate assistance;Sit to/from stand       Functional mobility during ADLs: Moderate assistance       Vision Patient Visual Report: No change from baseline  Pertinent Vitals/Pain Pain Assessment: Faces Faces Pain Scale: Hurts even more Pain Location: back and L hip Pain Descriptors / Indicators: Aching;Discomfort Pain Intervention(s): Limited activity within patient's tolerance;Monitored during session;Repositioned;Patient  requesting pain meds-RN notified     Extremity/Trunk Assessment Upper Extremity Assessment Upper Extremity Assessment: Generalized weakness   Lower Extremity Assessment Lower Extremity Assessment: Generalized weakness       Communication Communication Communication: No difficulties   Cognition Arousal/Alertness: Awake/alert Behavior During Therapy: Restless Overall Cognitive Status: No family/caregiver present to determine baseline cognitive functioning                                 General Comments: Pt does reports feeling"foggy" when thinking and offen having a startled look in her eyes but follows commands with increased time and is cooperative and pleasant throughout.                Home Living Family/patient expects to be discharged to:: Private residence Living Arrangements: Alone Available Help at Discharge: Family;Available 24 hours/day (daughter can stay with her) Type of Home: Mobile home Home Access: Stairs to enter Entrance Stairs-Number of Steps: 4 Entrance Stairs-Rails: Right;Left;Can reach both Home Layout: One level     Bathroom Shower/Tub: Teacher, early years/pre: Standard     Home Equipment: Conservation officer, nature (2 wheels)          Prior Functioning/Environment Prior Level of Function : Independent/Modified Independent             Mobility Comments: Pt reports ind at baseline but has felt weak and has "been falling a whole lot" ADLs Comments: Pt reports ind with self care tasks and IADLs. She states her daughter will be staying at her house and can help her if needed.        OT Problem List: Decreased strength;Decreased activity tolerance;Impaired balance (sitting and/or standing);Decreased knowledge of use of DME or AE;Decreased safety awareness;Cardiopulmonary status limiting activity;Decreased cognition      OT Treatment/Interventions: Self-care/ADL training;Therapeutic exercise;Therapeutic activities;Energy  conservation;DME and/or AE instruction;Patient/family education;Balance training;Manual therapy    OT Goals(Current goals can be found in the care plan section) Acute Rehab OT Goals Patient Stated Goal: to get better and stronger to go home OT Goal Formulation: With patient Time For Goal Achievement: 03/23/21 Potential to Achieve Goals: Good ADL Goals Pt Will Perform Grooming: standing;with supervision Pt Will Perform Lower Body Dressing: with supervision;sit to/from stand Pt Will Transfer to Toilet: with supervision;ambulating Pt Will Perform Toileting - Clothing Manipulation and hygiene: with supervision;sit to/from stand  OT Frequency: Min 2X/week       AM-PAC OT "6 Clicks" Daily Activity     Outcome Measure Help from another person eating meals?: None Help from another person taking care of personal grooming?: A Little Help from another person toileting, which includes using toliet, bedpan, or urinal?: A Lot Help from another person bathing (including washing, rinsing, drying)?: A Lot Help from another person to put on and taking off regular upper body clothing?: A Little Help from another person to put on and taking off regular lower body clothing?: A Lot 6 Click Score: 16   End of Session Equipment Utilized During Treatment: Other (comment);Oxygen (BSC) Nurse Communication: Mobility status;Patient requests pain meds  Activity Tolerance: Patient limited by fatigue Patient left: in bed;with call bell/phone within reach;with bed alarm set  OT Visit Diagnosis: Unsteadiness on feet (R26.81);Repeated falls (R29.6);Muscle weakness (generalized) (M62.81);History of falling (  Z91.81);Pain Pain - Right/Left: Left Pain - part of body: Hip                Time: [id number] OT Time Calculation (min): 27 min Charges:  OT General Charges $OT Visit: 1 Visit OT Evaluation $OT Eval Moderate Complexity: 1 Mod OT Treatments $Self Care/Home Management : 8-22 mins  Darleen Crocker, MS, OTR/L ,  CBIS ascom [phone]  03/09/21, 1:46 PM

## 2021-03-09 NOTE — Assessment & Plan Note (Signed)
>>  ASSESSMENT AND PLAN FOR DEPRESSION, RECURRENT (HCC) WRITTEN ON 03/09/2021  4:33 PM BY DANFORD, CHRISTOPHER P, MD  Hold duloxetine, Remeron

## 2021-03-09 NOTE — NC FL2 (Signed)
[place] MEDICAID FL2 LEVEL OF CARE SCREENING TOOL     IDENTIFICATION  Patient Name: Sandra Stout Birthdate: Oct 05, 1945 Sex: female Admission Date (Current Location): 03/07/2021  [facility] and [place] Number:  Chiropodist and Address:         Provider Number: [id number]  Attending Physician Name and Address:  Alberteen Sam, *  Relative Name and Phone Number:       Current Level of Care: Hospital Recommended Level of Care: Skilled Nursing Facility Prior Approval Number:    Date Approved/Denied:   PASRR Number: [id number]  Discharge Plan: SNF    Current Diagnoses: Patient Active Problem List   Diagnosis Date Noted   Acute metabolic encephalopathy 03/09/2021   Hypokalemia 03/08/2021   Right middle lobe pneumonia 03/07/2021   Upper respiratory tract infection 03/02/2021   Wheezing 03/02/2021   Recurrent falls 10/15/2018   Depression, recurrent (HCC) 02/03/2018   Chronic back pain 08/17/2014   GERD (gastroesophageal reflux disease) 08/17/2014   Anxiety    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    Orientation RESPIRATION BLADDER Height & Weight     Self, Place  Normal (3L [place]) Incontinent Weight: 52.2 kg Height:  5\' 2"  (157.5 cm)  BEHAVIORAL SYMPTOMS/MOOD NEUROLOGICAL BOWEL NUTRITION STATUS      Continent Diet (Heart Healthy)  AMBULATORY STATUS COMMUNICATION OF NEEDS Skin   Extensive Assist Verbally Normal                       Personal Care Assistance Level of Assistance              Functional Limitations Info             SPECIAL CARE FACTORS FREQUENCY  PT (By licensed PT), OT (By licensed OT)                    Contractures Contractures Info: Not present    Additional Factors Info  Code Status, Allergies Code Status Info: DNR Allergies Info: NKDA           Current Medications (03/09/2021):  This is the current hospital active medication list Current Facility-Administered Medications  Medication  Dose Route Frequency Provider Last Rate Last Admin   acetaminophen (TYLENOL) tablet 650 mg  650 mg Oral Q6H PRN Mansy, Jan A, MD   650 mg at 03/09/21 1240   Or   acetaminophen (TYLENOL) suppository 650 mg  650 mg Rectal Q6H PRN Mansy, Jan A, MD       azithromycin [facility]) tablet 500 mg  500 mg Oral Q2200 Rauer, [facility], RPH       cefTRIAXone (ROCEPHIN) 2 g in sodium chloride 0.9 % 100 mL IVPB  2 g Intravenous Q24H Mansy, Jan A, MD   Stopped at 03/08/21 2140   clonazePAM (KLONOPIN) disintegrating tablet 0.25 mg  0.25 mg Oral Daily 05/06/21, MD   0.25 mg at 03/09/21 0920   DULoxetine (CYMBALTA) DR capsule 60 mg  60 mg Oral Daily Mansy, Jan A, MD   60 mg at 03/09/21 0920   enoxaparin (LOVENOX) injection 40 mg  40 mg Subcutaneous Q24H Mansy, Jan A, MD   40 mg at 03/09/21 0919   guaiFENesin (MUCINEX) 12 hr tablet 600 mg  600 mg Oral BID Mansy, Jan A, MD   600 mg at 03/09/21 0920   loratadine (CLARITIN) tablet 10 mg  10 mg Oral Daily Mansy, Jan A, MD   10 mg at 03/09/21  0920   magnesium hydroxide (MILK OF MAGNESIA) suspension 30 mL  30 mL Oral Daily PRN Mansy, Jan A, MD       MEDLINE mouth rinse  15 mL Mouth Rinse BID Danford, Earl Lites, MD       omega-3 acid ethyl esters (LOVAZA) capsule 1 g  1 g Oral Daily Mansy, Jan A, MD   1 g at 03/09/21 0920   ondansetron (ZOFRAN) tablet 4 mg  4 mg Oral Q6H PRN Mansy, Jan A, MD       Or   ondansetron [facility]) injection 4 mg  4 mg Intravenous Q6H PRN Mansy, Jan A, MD       potassium chloride (KLOR-CON) packet 40 mEq  40 mEq Oral Once Mansy, Jan A, MD       traZODone (DESYREL) tablet 25 mg  25 mg Oral QHS PRN Mansy, Jan A, MD   25 mg at 03/08/21 2055     Discharge Medications: Please see discharge summary for a list of discharge medications.  Relevant Imaging Results:  Relevant Lab Results:   Additional Information ss [id number]  Chapman Fitch, RN

## 2021-03-10 ENCOUNTER — Inpatient Hospital Stay: Payer: Medicare Other

## 2021-03-10 DIAGNOSIS — J189 Pneumonia, unspecified organism: Secondary | ICD-10-CM | POA: Diagnosis not present

## 2021-03-10 DIAGNOSIS — F339 Major depressive disorder, recurrent, unspecified: Secondary | ICD-10-CM | POA: Diagnosis not present

## 2021-03-10 DIAGNOSIS — E876 Hypokalemia: Secondary | ICD-10-CM | POA: Diagnosis not present

## 2021-03-10 LAB — BASIC METABOLIC PANEL
Anion gap: 9 (ref 5–15)
BUN: 9 mg/dL (ref 8–23)
CO2: 26 mmol/L (ref 22–32)
Calcium: 8.6 mg/dL — ABNORMAL LOW (ref 8.9–10.3)
Chloride: 103 mmol/L (ref 98–111)
Creatinine, Ser: 0.47 mg/dL (ref 0.44–1.00)
GFR, Estimated: >60 mL/min (ref >60)
Glucose, Bld: 92 mg/dL (ref 70–99)
Potassium: 3.3 mmol/L — ABNORMAL LOW (ref 3.5–5.1)
Sodium: 138 mmol/L (ref 135–145)

## 2021-03-10 LAB — TSH: TSH: 2.68 u[IU]/mL (ref 0.350–4.500)

## 2021-03-10 LAB — CBC
HCT: 33.2 % — ABNORMAL LOW (ref 36.0–46.0)
Hemoglobin: 11.5 g/dL — ABNORMAL LOW (ref 12.0–15.0)
MCH: 31.2 pg (ref 26.0–34.0)
MCHC: 34.6 g/dL (ref 30.0–36.0)
MCV: 90 fL (ref 80.0–100.0)
Platelets: 488 10*3/uL — ABNORMAL HIGH (ref 150–400)
RBC: 3.69 MIL/uL — ABNORMAL LOW (ref 3.87–5.11)
RDW: 13.2 % (ref 11.5–15.5)
WBC: 9 10*3/uL (ref 4.0–10.5)
nRBC: 0 % (ref 0.0–0.2)

## 2021-03-10 LAB — HEPATIC FUNCTION PANEL
ALT: 11 U/L (ref 0–44)
AST: 19 U/L (ref 15–41)
Albumin: 2.7 g/dL — ABNORMAL LOW (ref 3.5–5.0)
Alkaline Phosphatase: 76 U/L (ref 38–126)
Bilirubin, Direct: <0.1 mg/dL (ref 0.0–0.2)
Total Bilirubin: 0.4 mg/dL (ref 0.3–1.2)
Total Protein: 6.6 g/dL (ref 6.5–8.1)

## 2021-03-10 LAB — HIV ANTIBODY (ROUTINE TESTING W REFLEX): HIV Screen 4th Generation wRfx: NONREACTIVE

## 2021-03-10 LAB — AMMONIA: Ammonia: 13 umol/L (ref 9–35)

## 2021-03-10 LAB — VITAMIN B12: Vitamin B-12: 2266 pg/mL — ABNORMAL HIGH (ref 180–914)

## 2021-03-10 MED ORDER — POTASSIUM CHLORIDE CRYS ER 20 MEQ PO TBCR: 40.0000 meq | EXTENDED_RELEASE_TABLET | Freq: Once | ORAL | Status: DC

## 2021-03-10 MED ORDER — POTASSIUM CHLORIDE CRYS ER 20 MEQ PO TBCR
40.0000 meq | EXTENDED_RELEASE_TABLET | Freq: Once | ORAL | Status: AC
  Administered 2021-03-10: 40 meq via ORAL
  Filled 2021-03-10: qty 2

## 2021-03-10 MED ORDER — IPRATROPIUM-ALBUTEROL 0.5-2.5 (3) MG/3ML IN SOLN: 3.0000 mL | RESPIRATORY_TRACT | Status: DC | PRN

## 2021-03-10 NOTE — Progress Notes (Signed)
Mobility Specialist - Progress Note   03/10/21 1200  Mobility  Activity Refused mobility  Mobility performed by Mobility specialist    Pt declined mobility, no reason specified. Pt sleeping on arrival, awakened by voice, but does not stay awake through conversation. Pt left in bed with alarm set.    Sandra Stout Mobility Specialist 03/10/21, 12:27 PM

## 2021-03-10 NOTE — Assessment & Plan Note (Signed)
K still low - Supplement K

## 2021-03-10 NOTE — Progress Notes (Signed)
Progress Note   Patient: Sandra Stout [id number] DOB: 26-Jul-1945 DOA: 03/07/2021     3 DOS: the patient was seen and examined on 03/10/2021       Brief hospital course: Sandra Stout is a 76 y.o. F with ADD, anxiety, narcolepsy and panic attacks who presented with new cough productive of yellow sputum over the last 3 to 4 days with low-grade fever and chills as well as dysuria and low back pain.     In the ER, heart rate was 104 and respiratory rate was 20 with a pulse oximetry of 95% on room air and 89% that was up to 92% on 1 L of O2 by nasal cannula.  Leukocytosis noted.  Chest x-ray showed reticular interstitial opacity due to chronic lung disease no other focal infiltrate.  The patient was given IV Rocephin and Zithromax,       Assessment and Plan * Right middle lobe pneumonia- (present on admission) No significant end organ damage (I suspect the patients hypoxia is from COPD flare), sepsis and respiratory failure ruled out.  -Continue Rocephin and azithromycin  Acute metabolic encephalopathy- (present on admission) Daughter confirms, at baseline, patietn has what sounds like dementia.  For at least the last year, she has been noticeably forgetful, forgetting bills, forgetting her medicines, forgetting people, and even at times forgetting what she was doing.  On the current admission, she was disoriented and confused at admission, didn't know where she was or who medical staff were.  This delirium seems multifactorial from Ritalin use, Xanax withdrawal in setting of hypoxia and pneumonia and COPD flare.  - Hold duloxetine, Xanax, BuSpar, mirtazapine - Low-dose clonazepam to prevent withdrawals  Given some persistent confusion, will check: - B12, RPR, ammonia, TSH - MRI brain ordered      COPD with acute exacerbation ([place])- (present on admission) No PFTs, but longtime smoker, heavy wheezing here, hypoxic and a history of repeated "pneumonia" over the last few days.  -  Continue prednisone day 2 of 5 - Continue scheduled bronchodilators - Recommend PFTs as outpatient  Hypokalemia- (present on admission) K still low - Supplement K  Depression, recurrent ([place])- (present on admission) - Hold duloxetine, Remeron, Xanax  Primary narcolepsy without cataplexy- (present on admission) - Hold Ritalin given confusion     Subjective: Tired, still a little "confused".  No fever.  Still on O2.    Objective Vital signs were reviewed and unremarkable except still on 2L O2. General appearance: Thin elderly female, no acute distress     HEENT: Pupils back to normal, they have been dilated previously. Skin: Without jaundice. Cardiac: RRR, no murmurs, no lower extremity edema, no JVD Respiratory: Normal respiratory rate and rhythm at rest, nasal cannula in place, wheezing bilaterally, extensive, no rales. Abdomen: Abdomen soft no tenderness palpation or guarding, no ascites or distention MSK:  Neuro: Awake, pupils now normal.  Extraocular movements intact without nystagmus, she has mild ataxia on the left side with finger-to-nose testing, strength generally weak but symmetric in both extremities, no tremor. Psych: Attention seems a little distracted, affect blunted, judgment and insight appear mildly impaired, oriented to self, hospital, 2023.    Data Reviewed:  Patient metabolic panel notable for hypokalemia, blood counts notable for mild anemia, stable from yesterday  Family Communication: Daughter by phone  Disposition: Status is: Inpatient  Remains inpatient appropriate because: She remains on new supplemental oxygen, she remains confused and having difficulty walking.  If her MRI brain, and laboratory work-up are negative,  and she is weaned off of oxygen in the next 1 to 2 days, likely to SNF on Monday           Author: Edwin Dada, MD 03/10/2021 1:53 PM  For on call review [email or web address].

## 2021-03-10 NOTE — Assessment & Plan Note (Addendum)
-   Hold duloxetine, Remeron, Xanax

## 2021-03-10 NOTE — Assessment & Plan Note (Signed)
No significant end organ damage (I suspect the patients hypoxia is from COPD flare), sepsis and respiratory failure ruled out.  -Continue Rocephin and azithromycin

## 2021-03-10 NOTE — Assessment & Plan Note (Signed)
>>  ASSESSMENT AND PLAN FOR DEPRESSION, RECURRENT (HCC) WRITTEN ON 03/10/2021  1:53 PM BY DANFORD, CHRISTOPHER P, MD  - Hold duloxetine, Remeron, Xanax

## 2021-03-10 NOTE — Assessment & Plan Note (Signed)
-   Hold Ritalin given confusion

## 2021-03-10 NOTE — Assessment & Plan Note (Signed)
No PFTs, but longtime smoker, heavy wheezing here, hypoxic and a history of repeated "pneumonia" over the last few days.  - Continue prednisone day 2 of 5 - Continue scheduled bronchodilators - Recommend PFTs as outpatient

## 2021-03-10 NOTE — Assessment & Plan Note (Addendum)
Daughter confirms, at baseline, patietn has what sounds like dementia.  For at least the last year, she has been noticeably forgetful, forgetting bills, forgetting her medicines, forgetting people, and even at times forgetting what she was doing.  On the current admission, she was disoriented and confused at admission, didn't know where she was or who medical staff were.  This delirium seems multifactorial from Ritalin use, Xanax withdrawal in setting of hypoxia and pneumonia and COPD flare.  - Hold duloxetine, Xanax, BuSpar, mirtazapine - Low-dose clonazepam to prevent withdrawals   Given some persistent confusion, will check: - B12, RPR, ammonia, TSH - MRI brain ordered      Delirium precautions:   -Lights and TV off, minimize interruptions at night  -Blinds open and lights on during day  -Glasses/hearing aid with patient  -Frequent reorientation  -PT/OT when able  -Avoid sedation medications/[place] list medications

## 2021-03-11 DIAGNOSIS — F339 Major depressive disorder, recurrent, unspecified: Secondary | ICD-10-CM | POA: Diagnosis not present

## 2021-03-11 DIAGNOSIS — G9341 Metabolic encephalopathy: Secondary | ICD-10-CM

## 2021-03-11 DIAGNOSIS — J441 Chronic obstructive pulmonary disease with (acute) exacerbation: Secondary | ICD-10-CM

## 2021-03-11 DIAGNOSIS — J189 Pneumonia, unspecified organism: Secondary | ICD-10-CM | POA: Diagnosis not present

## 2021-03-11 LAB — RPR: RPR Ser Ql: NONREACTIVE

## 2021-03-11 NOTE — Progress Notes (Signed)
°  Progress Note   Patient: Sandra Stout [id number] DOB: [birth year] DOA: 03/07/2021     4 DOS: the patient was seen and examined on 03/11/2021   Brief hospital course: Sandra Stout is a 76 y.o. F with ADD, anxiety, narcolepsy and panic attacks who presented with new cough productive of yellow sputum over the last 3 to 4 days with low-grade fever and chills as well as dysuria and low back pain.     In the ER, heart rate was 104 and respiratory rate was 20 with a pulse oximetry of 95% on room air and 89% that was up to 92% on 1 L of O2 by nasal cannula.  Leukocytosis noted.  Chest x-ray showed reticular interstitial opacity due to chronic lung disease no other focal infiltrate.  The patient was given IV Rocephin and Zithromax,    Assessment and Plan * Right middle lobe pneumonia- (present on admission) No significant end organ damage (I suspect the patients hypoxia is from COPD flare), sepsis and respiratory failure ruled out.  -Continue Rocephin and azithromycin day 5 of 5  Acute metabolic encephalopathy- (present on admission) See prior summary.  TSH, ammonia, B12 normal.  MRI brain shows atrophy, no acute findings.  She is consistently gradually more alert and oriented all week, consistent with a toxic metabolic encephalopathy likely from overuse of duloxetine, Xanax, Ritalin, BuSpar, and mirtazapine.  - Hold duloxetine, Xanax, BuSpar, mirtazapine - Low-dose clonazepam to prevent withdrawals        COPD with acute exacerbation ([place])- (present on admission) No PFTs, but longtime smoker, heavy wheezing here, hypoxic and a history of repeated "pneumonia" over the last few days.  Improving with prednisone - Continue prednisone day 3 of 5 - Continue scheduled bronchodilators - Recommend PFTs as outpatient  Hypokalemia- (present on admission)    Depression, recurrent ([place])- (present on admission) - Hold duloxetine, Remeron, Xanax  Primary narcolepsy without cataplexy- (present on  admission) - Hold Ritalin given confusion     Subjective: Patient is asking about her Ritalin.  She feels more alert, her cough is improving gradually but still present.  Objective Vital signs were reviewed and unremarkable.  Still on supplemental oxygen. Elderly adult female, lying in bed, no acute distress.  Pupils now normal, extraocular movements intact, no ataxia.  Speech is fluent, she seems a little forgetful, but is oriented to person, place, time, and situation.  She has no recollection of her initial illness.  She has got occasional cough, and some coarse rhonchi but no rales, no increased respiratory effort.  Heart regular, no murmurs, no lower extremity edema.  Attention normal, responds appropriately to questions, affect tired.  Data Reviewed:  There are no new results to review at this time.  Family Communication: Daughter by phone  Disposition: Status is: Inpatient  Remains inpatient appropriate because: Patient had severe encephalopathy and pneumonia.  She is improving but will need significant rehab to return to her prior level of function.             Author: Edwin Dada, MD 03/11/2021 12:26 PM  For on call review [email or web address].

## 2021-03-11 NOTE — Assessment & Plan Note (Signed)
No significant end organ damage (I suspect the patients hypoxia is from COPD flare), sepsis and respiratory failure ruled out.  -Continue Rocephin and azithromycin day 5 of 5

## 2021-03-11 NOTE — TOC Progression Note (Signed)
Transition of Care [facility]) - Progression Note    Patient Details  Name: Sandra Stout MRN: [id number] Date of Birth: 07/27/1945  [facility]) CM/SW Contact  Bing Quarry, RN Phone Number: 03/11/2021, 12:10 PM  Clinical Narrative:  1/15: Patient is now medically ready to discharge to SNF/STR when set up. Left a VM with daughter, [name], with a call back number. I also tried to reach patient's room with no answer. Asked [name] to give RN CM a call back regarding STR matters discussed Friday by RN CM. Two places have accepted with the rest pending. Gabriel Cirri RN CM  [phone].     Expected Discharge Plan: Home w Home Health Services Barriers to Discharge: Continued Medical Work up  Expected Discharge Plan and Services Expected Discharge Plan: Home w Home Health Services   Discharge Planning Services: CM Consult   Living arrangements for the past 2 months: Single Family Home                                       Social Determinants of Health (SDOH) Interventions    Readmission Risk Interventions No flowsheet data found.

## 2021-03-11 NOTE — TOC Progression Note (Signed)
Transition of Care [facility]) - Progression Note    Patient Details  Name: Sandra Stout MRN: [id number] Date of Birth: 1945-04-28  [facility]) CM/SW Contact  Bing Quarry, RN Phone Number: 03/11/2021, 12:52 PM  Clinical Narrative: Daughter, Sandra Stout, returned call. Discussed bed offers thus far. Preference is for [facility] to be close to family members that can visit and also help with care in SNF per daughter. [facility] is still pending. CM to follow up Monday. Daughter works after 130 pm. Gabriel Cirri RN CM     Expected Discharge Plan: Home w Home Health Services Barriers to Discharge: SNF Pending bed offer  Expected Discharge Plan and Services Expected Discharge Plan: Home w Home Health Services   Discharge Planning Services: CM Consult   Living arrangements for the past 2 months: Single Family Home                 DME Arranged: N/A DME Agency: NA       HH Arranged: NA HH Agency: NA         Social Determinants of Health (SDOH) Interventions    Readmission Risk Interventions No flowsheet data found.

## 2021-03-11 NOTE — Assessment & Plan Note (Signed)
>>  ASSESSMENT AND PLAN FOR DEPRESSION, RECURRENT (HCC) WRITTEN ON 03/11/2021 12:26 PM BY DANFORD, CHRISTOPHER P, MD  - Hold duloxetine, Remeron, Xanax

## 2021-03-11 NOTE — Assessment & Plan Note (Signed)
-   Hold Ritalin given confusion °

## 2021-03-11 NOTE — Assessment & Plan Note (Signed)
No PFTs, but longtime smoker, heavy wheezing here, hypoxic and a history of repeated "pneumonia" over the last few days.  Improving with prednisone - Continue prednisone day 3 of 5 - Continue scheduled bronchodilators - Recommend PFTs as outpatient

## 2021-03-11 NOTE — Assessment & Plan Note (Signed)
-   Hold duloxetine, Remeron, Xanax °

## 2021-03-11 NOTE — Assessment & Plan Note (Signed)
See prior summary.  TSH, ammonia, B12 normal.  MRI brain shows atrophy, no acute findings.  She is consistently gradually more alert and oriented all week, consistent with a toxic metabolic encephalopathy likely from overuse of duloxetine, Xanax, Ritalin, BuSpar, and mirtazapine.  - Hold duloxetine, Xanax, BuSpar, mirtazapine - Low-dose clonazepam to prevent withdrawals

## 2021-03-12 DIAGNOSIS — G8929 Other chronic pain: Secondary | ICD-10-CM

## 2021-03-12 DIAGNOSIS — I10 Essential (primary) hypertension: Secondary | ICD-10-CM | POA: Diagnosis not present

## 2021-03-12 DIAGNOSIS — J309 Allergic rhinitis, unspecified: Secondary | ICD-10-CM | POA: Diagnosis not present

## 2021-03-12 DIAGNOSIS — M545 Low back pain, unspecified: Secondary | ICD-10-CM

## 2021-03-12 DIAGNOSIS — R488 Other symbolic dysfunctions: Secondary | ICD-10-CM | POA: Diagnosis not present

## 2021-03-12 DIAGNOSIS — J449 Chronic obstructive pulmonary disease, unspecified: Secondary | ICD-10-CM | POA: Diagnosis not present

## 2021-03-12 DIAGNOSIS — G629 Polyneuropathy, unspecified: Secondary | ICD-10-CM | POA: Diagnosis not present

## 2021-03-12 DIAGNOSIS — G47419 Narcolepsy without cataplexy: Secondary | ICD-10-CM | POA: Diagnosis not present

## 2021-03-12 DIAGNOSIS — J189 Pneumonia, unspecified organism: Secondary | ICD-10-CM | POA: Diagnosis not present

## 2021-03-12 DIAGNOSIS — E876 Hypokalemia: Secondary | ICD-10-CM | POA: Diagnosis not present

## 2021-03-12 DIAGNOSIS — E569 Vitamin deficiency, unspecified: Secondary | ICD-10-CM | POA: Diagnosis not present

## 2021-03-12 DIAGNOSIS — R5383 Other fatigue: Secondary | ICD-10-CM | POA: Diagnosis not present

## 2021-03-12 DIAGNOSIS — M6259 Muscle wasting and atrophy, not elsewhere classified, multiple sites: Secondary | ICD-10-CM | POA: Diagnosis not present

## 2021-03-12 DIAGNOSIS — E785 Hyperlipidemia, unspecified: Secondary | ICD-10-CM | POA: Diagnosis not present

## 2021-03-12 DIAGNOSIS — M6281 Muscle weakness (generalized): Secondary | ICD-10-CM | POA: Diagnosis not present

## 2021-03-12 DIAGNOSIS — J441 Chronic obstructive pulmonary disease with (acute) exacerbation: Secondary | ICD-10-CM | POA: Diagnosis not present

## 2021-03-12 DIAGNOSIS — F17200 Nicotine dependence, unspecified, uncomplicated: Secondary | ICD-10-CM | POA: Diagnosis not present

## 2021-03-12 DIAGNOSIS — Z736 Limitation of activities due to disability: Secondary | ICD-10-CM | POA: Diagnosis not present

## 2021-03-12 DIAGNOSIS — G9341 Metabolic encephalopathy: Secondary | ICD-10-CM | POA: Diagnosis not present

## 2021-03-12 LAB — CULTURE, BLOOD (ROUTINE X 2)
Culture: NO GROWTH
Culture: NO GROWTH

## 2021-03-12 LAB — BASIC METABOLIC PANEL
Anion gap: 7 (ref 5–15)
BUN: 21 mg/dL (ref 8–23)
CO2: 29 mmol/L (ref 22–32)
Calcium: 9.1 mg/dL (ref 8.9–10.3)
Chloride: 104 mmol/L (ref 98–111)
Creatinine, Ser: 0.73 mg/dL (ref 0.44–1.00)
GFR, Estimated: >60 mL/min (ref >60)
Glucose, Bld: 98 mg/dL (ref 70–99)
Potassium: 4.2 mmol/L (ref 3.5–5.1)
Sodium: 140 mmol/L (ref 135–145)

## 2021-03-12 LAB — RESP PANEL BY RT-PCR (FLU A&B, COVID) ARPGX2
Influenza A by PCR: NEGATIVE
Influenza B by PCR: NEGATIVE
SARS Coronavirus 2 by RT PCR: NEGATIVE

## 2021-03-12 MED ORDER — BUSPIRONE HCL 10 MG PO TABS: 10.0000 mg | ORAL_TABLET | Freq: Two times a day (BID) | ORAL | 0 refills | Status: DC

## 2021-03-12 MED ORDER — CLONAZEPAM 0.5 MG PO TBDP: 0.5000 mg | ORAL_TABLET | Freq: Every day | ORAL | 0 refills | Status: DC

## 2021-03-12 MED ORDER — GUAIFENESIN ER 600 MG PO TB12: 600.0000 mg | ORAL_TABLET | Freq: Two times a day (BID) | ORAL | Status: DC

## 2021-03-12 MED ORDER — PREDNISONE 20 MG PO TABS: 40.0000 mg | ORAL_TABLET | Freq: Every day | ORAL | 0 refills | Status: AC

## 2021-03-12 MED ORDER — TRAZODONE HCL 50 MG PO TABS: 25.0000 mg | ORAL_TABLET | Freq: Every evening | ORAL | Status: DC | PRN

## 2021-03-12 MED ORDER — METHYLPHENIDATE HCL 10 MG PO TABS: 20.0000 mg | ORAL_TABLET | Freq: Three times a day (TID) | ORAL | 0 refills | Status: DC

## 2021-03-12 MED ORDER — METHYLPHENIDATE HCL 10 MG PO TABS: 10.0000 mg | ORAL_TABLET | Freq: Three times a day (TID) | ORAL | 0 refills | Status: DC

## 2021-03-12 NOTE — Discharge Summary (Signed)
Physician Discharge Summary   Patient: Sandra Stout MRN: [id number] DOB: 01/13/1946  Admit date:     03/07/2021  Discharge date: 03/12/21  Discharge Physician: Edwin Dada   PCP: Valerie Roys, DO   Recommendations at discharge:  Follow up with PCP Dr. Wynetta Emery in 1 week after discharge from rehab Follow up with Dr. Lisabeth Devoid Neurology 1 week after discharge from rehab Follow up with [facility] 1 week after discharge from rehab Rehab: Please obtain BMP in 1 week to check K Dr. Lisabeth Devoid: Patient presented with confusion, mydriasis, and restlessness and family report she was forgetful of her medicines; please review Ritalin utility and safety, dose currently has been reduced Gaetano Net: Patient presented altered and with family concerns about home medication monitoring; please review safety of Xanax use, currently Xanax stopped, clonazepam 0.5 daily started, possibly to taper Dr. Wynetta Emery: Please refer for PFTs given bronchospasm on this admission Dr. Wynetta Emery: Please screen for dementia after resolution of current illness and refer for neuropsychiatric testing vs. Geriatric referral as indicated          Discharge Diagnoses Principal Problem:   Right middle lobe pneumonia Active Problems:   Acute metabolic encephalopathy   COPD with acute exacerbation ([place])   Hypokalemia   Primary narcolepsy without cataplexy   Chronic back pain   Depression, recurrent [facility])     Hospital Course   Sandra Stout is a 76 y.o. F with ADD, anxiety, narcolepsy and panic attacks who presented with new cough productive of yellow sputum over the last 3 to 4 days with low-grade fever and chills as well as dysuria and low back pain.     In the ER, heart rate was 104 and respiratory rate was 20 with a pulse oximetry of 95% on room air and 89% that was up to 92% on 1 L of O2 by nasal cannula.  Leukocytosis noted.  Chest x-ray showed reticular interstitial opacity due to chronic lung  disease no other focal infiltrate.  The patient was given IV Rocephin and Zithromax,        * Right middle lobe pneumonia- (present on admission) No significant end organ damage (I suspect the patients hypoxia is from COPD flare), sepsis and respiratory failure ruled out.  Treated with Rocephin and azithromycin to complete a 5 day course. Patient now mentating at baseline, taking orals.  Temp < 100 F, heart rate < 100bpm, RR < 24, SpO2 at baseline.   Stable for discharge.    Acute metabolic encephalopathy- (present on admission) Daughter confirmed, at baseline, patient has what sounds like dementia.  For at least the last year, she has been noticeably forgetful, forgetting bills, forgetting her medicines, forgetting people, and even at times forgetting what she was doing.   On the current admission, she was disoriented and confused at admission, had mydriasis and restlessness and dry mouth, didn't know where she was or who medical staff were.  This delirium was likely multifactorial from Ritalin use, Xanax withdrawal in setting of hypoxia and pneumonia and COPD flare. TSH, ammonia, B12 normal.  MRI brain showed atrophy, no acute findings.  Her ritalin and Xanax were held and she was started on low dose clonazepam once daily.  She was progressively more alert and oriented all week, consistent with a toxic metabolic encephalopathy likely from hypoxia in the setting of overuse of duloxetine, Xanax, Ritalin, BuSpar, and mirtazapine.  STOP Xanax Start low dose clonazepam with plan to taper as an outpatient  REDUCE  Ritalin by half  Close follow up with Ritalin and Xanax prescribers      COPD with acute exacerbation ([place])- (present on admission) No PFTs, but longtime smoker, heavy wheezing here, hypoxic and a history of repeated "pneumonia" over the last few days.  Started on steroids and bronchodilators and improved wheezing.  - Complete 5 day prednisone burst.   - Recommend PFTs as  outpatient  Hypokalemia- (present on admission) Treated and resolved   Depression, recurrent ([place])- (present on admission)    Primary narcolepsy without cataplexy- (present on admission)        Pain control - [place] Controlled Substance Reporting System database was reviewed.     Disposition: Skilled nursing facility Diet recommendation: Regular diet  DISCHARGE MEDICATION: Allergies as of 03/12/2021   No Known Allergies      Medication List     STOP taking these medications    ALPRAZolam 1 MG tablet Commonly known as: XANAX   amoxicillin-clavulanate 875-125 MG tablet Commonly known as: AUGMENTIN   DULoxetine 60 MG capsule Commonly known as: Cymbalta   Lidocaine 4 % Ptch   mirtazapine 15 MG tablet Commonly known as: REMERON   multivitamin capsule   naproxen 500 MG tablet Commonly known as: NAPROSYN       TAKE these medications    albuterol 108 (90 Base) MCG/ACT inhaler Commonly known as: VENTOLIN HFA Inhale 2 puffs into the lungs every 6 (six) hours as needed for wheezing or shortness of breath.   Balanced B-50 Tabs Take by mouth.   busPIRone 10 MG tablet Commonly known as: BUSPAR Take 1 tablet (10 mg total) by mouth 2 (two) times daily. What changed:  medication strength how much to take when to take this   clonazePAM 0.5 MG disintegrating tablet Commonly known as: KLONOPIN Take 1 tablet (0.5 mg total) by mouth daily. Start taking on: March 13, 2021   co-enzyme Q-10 50 MG capsule Take 50 mg by mouth daily.   fexofenadine 180 MG tablet Commonly known as: Allegra Allergy Take 1 tablet (180 mg total) by mouth daily. What changed:  when to take this reasons to take this   Fish Oil 1000 MG Caps Take by mouth.   guaiFENesin 600 MG 12 hr tablet Commonly known as: MUCINEX Take 1 tablet (600 mg total) by mouth 2 (two) times daily.   methylphenidate 10 MG tablet Commonly known as: RITALIN Take 2 tablets (20 mg total) by  mouth 3 (three) times daily with meals. What changed:  medication strength when to take this   predniSONE 20 MG tablet Commonly known as: DELTASONE Take 2 tablets (40 mg total) by mouth daily with breakfast for 2 days. Start taking on: March 13, 2021   traZODone 50 MG tablet Commonly known as: DESYREL Take 0.5 tablets (25 mg total) by mouth at bedtime as needed for sleep.        Contact information for follow-up providers     Johnson, Megan P, DO. Schedule an appointment as soon as possible for a visit.   Specialty: Family Medicine Why: One week after discharge from skilled rehab Contact information: [place] [address] [phone]              Contact information for after-discharge care     Destination     [facility] Preferred [facility] .   Service: Skilled Chiropodist information: [address] [place] [phone]  Discharge Instructions     Discharge instructions   Complete by: As directed    Go see Dr. Lisabeth Devoid and your primary care doctor within 1 week after discharge from rehab       Discharge Exam: Filed Weights   03/07/21 2036  Weight: 52.2 kg   General: Pt is alert, awake, not in acute distress Cardiovascular: RRR, nl S1-S2, no murmurs appreciated.   No LE edema.   Respiratory: Normal respiratory rate and rhythm.  CTAB without rales or wheezes. Abdominal: Abdomen soft and non-tender.  No distension or HSM.   Neuro/Psych: Strength symmetric in upper and lower extremities.  Judgment and insight appear slightly impaired.   Condition at discharge: Fair  The results of significant diagnostics from this hospitalization (including imaging, microbiology, ancillary and laboratory) are listed below for reference.   Imaging Studies: DG Chest 2 View  Result Date: 03/07/2021 CLINICAL DATA:  Shortness of breath EXAM: CHEST - 2 VIEW COMPARISON:  10/06/2018, CT  02/03/2014 FINDINGS: Trace right pleural effusion or thickening. Mild right greater than left reticular interstitial opacity likely due to chronic lung disease. No acute confluent airspace disease. Normal cardiomediastinal silhouette with aortic atherosclerosis. Suggestion of right hilar distortion. IMPRESSION: 1. Mild right greater than left diffuse reticular interstitial opacity likely due to chronic lung disease. Suggestion of right hilar distortion, potentially scarring though could correlate with CT to exclude nodule. 2. There is no acute confluent airspace disease. Electronically Signed   By: Donavan Foil M.D.   On: 03/07/2021 21:37   MR BRAIN WO CONTRAST  Result Date: 03/10/2021 CLINICAL DATA:  Mental status change, unknown cause EXAM: MRI HEAD WITHOUT CONTRAST TECHNIQUE: Multiplanar, multiecho pulse sequences of the brain and surrounding structures were obtained without intravenous contrast. COMPARISON:  None. FINDINGS: Motion artifact is present. Brain: There is no acute infarction or intracranial hemorrhage. There is no intracranial mass, mass effect, or edema. There is no hydrocephalus or extra-axial fluid collection. Prominence of the ventricles and sulci reflects mild parenchymal volume loss. Patchy foci of T2 hyperintensity in the supratentorial white matter are nonspecific but may reflect minor chronic microvascular ischemic changes. Vascular: Loss of the flow void of the visualized left cervical ICA and intracranial ICA to the level of the clinoid. Skull and upper cervical spine: Normal marrow signal is preserved. Sinuses/Orbits: Right greater than left maxillary sinus mucosal thickening. Orbits are unremarkable. Other: Sella is unremarkable.  Mastoid air cells are clear. IMPRESSION: Degraded by motion artifact. No acute infarction, hemorrhage, or mass. 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  Electronically Signed   By: Macy Mis M.D.   On: 03/10/2021 18:18    Microbiology: Results for orders placed or performed during the hospital encounter of 03/07/21  Resp Panel by RT-PCR (Flu A&B, Covid) Nasopharyngeal Swab     Status: None   Collection Time: 03/07/21  8:50 PM   Specimen: Nasopharyngeal Swab; Nasopharyngeal(NP) swabs in vial transport medium  Result Value Ref Range Status   SARS Coronavirus 2 by RT PCR NEGATIVE NEGATIVE Final    Comment: (NOTE) SARS-CoV-2 target nucleic acids are NOT DETECTED.  The SARS-CoV-2 RNA is generally detectable in upper respiratory specimens during the acute phase of infection. The lowest concentration of SARS-CoV-2 viral copies this assay can detect is 138 copies/mL. A negative result does not preclude SARS-Cov-2 infection and should not be used as the sole basis for treatment or other patient management decisions. A negative  result may occur with  improper specimen collection/handling, submission of specimen other than nasopharyngeal swab, presence of viral mutation(s) within the areas targeted by this assay, and inadequate number of viral copies(<138 copies/mL). A negative result must be combined with clinical observations, patient history, and epidemiological information. The expected result is Negative.  Fact Sheet for Patients:  EntrepreneurPulse.com.au  Fact Sheet for Healthcare Providers:  IncredibleEmployment.be  This test is no t yet approved or cleared by the [place] FDA and  has been authorized for detection and/or diagnosis of SARS-CoV-2 by FDA under an Emergency Use Authorization (EUA). This EUA will remain  in effect (meaning this test can be used) for the duration of the COVID-19 declaration under Section 564(b)(1) of the Act, 21 U.S.C.section 360bbb-3(b)(1), unless the authorization is terminated  or revoked sooner.       Influenza A by PCR NEGATIVE NEGATIVE Final    Influenza B by PCR NEGATIVE NEGATIVE Final    Comment: (NOTE) The Xpert Xpress SARS-CoV-2/FLU/RSV plus assay is intended as an aid in the diagnosis of influenza from Nasopharyngeal swab specimens and should not be used as a sole basis for treatment. Nasal washings and aspirates are unacceptable for Xpert Xpress SARS-CoV-2/FLU/RSV testing.  Fact Sheet for Patients: EntrepreneurPulse.com.au  Fact Sheet for Healthcare Providers: IncredibleEmployment.be  This test is not yet approved or cleared by the [place] FDA and has been authorized for detection and/or diagnosis of SARS-CoV-2 by FDA under an Emergency Use Authorization (EUA). This EUA will remain in effect (meaning this test can be used) for the duration of the COVID-19 declaration under Section 564(b)(1) of the Act, 21 U.S.C. section 360bbb-3(b)(1), unless the authorization is terminated or revoked.  Performed at [facility], [address] [place], [place] [address]   Culture, blood (routine x 2)     Status: None   Collection Time: 03/07/21 11:29 PM   Specimen: BLOOD  Result Value Ref Range Status   Specimen Description BLOOD LEFT HAND  Final   Special Requests   Final    BOTTLES DRAWN AEROBIC AND ANAEROBIC Blood Culture results may not be optimal due to an inadequate volume of blood received in culture bottles   Culture   Final    NO GROWTH 5 DAYS Performed at [facility], [address] [place], [place] [address]    Report Status 03/12/2021 FINAL  Final  Culture, blood (routine x 2)     Status: None   Collection Time: 03/07/21 11:29 PM   Specimen: BLOOD  Result Value Ref Range Status   Specimen Description BLOOD LEFT ASSIST CONTROL  Final   Special Requests   Final    BOTTLES DRAWN AEROBIC AND ANAEROBIC Blood Culture results may not be optimal due to an inadequate volume of blood received in culture bottles   Culture   Final    NO GROWTH 5  DAYS Performed at [facility], [address] [place],  [address]    Report Status 03/12/2021 FINAL  Final  Resp Panel by RT-PCR (Flu A&B, Covid) Nasopharyngeal Swab     Status: None   Collection Time: 03/12/21 10:49 AM   Specimen: Nasopharyngeal Swab; Nasopharyngeal(NP) swabs in vial transport medium  Result Value Ref Range Status   SARS Coronavirus 2 by RT PCR NEGATIVE NEGATIVE Final    Comment: (NOTE) SARS-CoV-2 target nucleic acids are NOT DETECTED.  The SARS-CoV-2 RNA is generally detectable in upper respiratory specimens during the acute phase of infection. The lowest concentration of  SARS-CoV-2 viral copies this assay can detect is 138 copies/mL. A negative result does not preclude SARS-Cov-2 infection and should not be used as the sole basis for treatment or other patient management decisions. A negative result may occur with  improper specimen collection/handling, submission of specimen other than nasopharyngeal swab, presence of viral mutation(s) within the areas targeted by this assay, and inadequate number of viral copies(<138 copies/mL). A negative result must be combined with clinical observations, patient history, and epidemiological information. The expected result is Negative.  Fact Sheet for Patients:  EntrepreneurPulse.com.au  Fact Sheet for Healthcare Providers:  IncredibleEmployment.be  This test is no t yet approved or cleared by the [place] FDA and  has been authorized for detection and/or diagnosis of SARS-CoV-2 by FDA under an Emergency Use Authorization (EUA). This EUA will remain  in effect (meaning this test can be used) for the duration of the COVID-19 declaration under Section 564(b)(1) of the Act, 21 U.S.C.section 360bbb-3(b)(1), unless the authorization is terminated  or revoked sooner.       Influenza A by PCR NEGATIVE NEGATIVE Final   Influenza B by PCR NEGATIVE NEGATIVE Final     Comment: (NOTE) The Xpert Xpress SARS-CoV-2/FLU/RSV plus assay is intended as an aid in the diagnosis of influenza from Nasopharyngeal swab specimens and should not be used as a sole basis for treatment. Nasal washings and aspirates are unacceptable for Xpert Xpress SARS-CoV-2/FLU/RSV testing.  Fact Sheet for Patients: EntrepreneurPulse.com.au  Fact Sheet for Healthcare Providers: IncredibleEmployment.be  This test is not yet approved or cleared by the [place] FDA and has been authorized for detection and/or diagnosis of SARS-CoV-2 by FDA under an Emergency Use Authorization (EUA). This EUA will remain in effect (meaning this test can be used) for the duration of the COVID-19 declaration under Section 564(b)(1) of the Act, 21 U.S.C. section 360bbb-3(b)(1), unless the authorization is terminated or revoked.  Performed at [facility], [address] [place], [place] [address]     Labs: CBC: Recent Labs  Lab 03/07/21 2050 03/08/21 0447 03/10/21 0619  WBC 12.6* 11.1* 9.0  NEUTROABS 9.6*  --   --   HGB 11.8* 11.8* 11.5*  HCT 35.4* 35.4* 33.2*  MCV 93.4 93.9 90.0  PLT 474* 455* [id number]*   Basic Metabolic Panel: Recent Labs  Lab 03/07/21 2050 03/08/21 0447 03/10/21 0619 03/12/21 0631  NA 136 137 138 140  K 3.0* 3.9 3.3* 4.2  CL 99 107 103 104  CO2 27 24 26 29   GLUCOSE 103* 91 92 98  BUN 15 16 9 21   CREATININE 0.75 0.61 0.47 0.73  CALCIUM 9.0 8.5* 8.6* 9.1   Liver Function Tests: Recent Labs  Lab 03/10/21 1040  AST 19  ALT 11  ALKPHOS 76  BILITOT 0.4  PROT 6.6  ALBUMIN 2.7*   CBG: No results for input(s): GLUCAP in the last 168 hours.  Discharge time spent: 45 minutes.  Signed: Edwin Dada, MD [facility] 03/12/2021

## 2021-03-12 NOTE — Assessment & Plan Note (Signed)
Treated and resolved °

## 2021-03-12 NOTE — Assessment & Plan Note (Signed)
No PFTs, but longtime smoker, heavy wheezing here, hypoxic and a history of repeated "pneumonia" over the last few days.  Started on steroids and bronchodilators and improved wheezing.  - Complete 5 day prednisone burst.   - Recommend PFTs as outpatient

## 2021-03-12 NOTE — Progress Notes (Signed)
Chaplain Maggie attempted to visit patient but she was on the phone. Chaplain expects to follow up.

## 2021-03-12 NOTE — Assessment & Plan Note (Addendum)
Daughter confirmed, at baseline, patient has what sounds like dementia.  For at least the last year, she has been noticeably forgetful, forgetting bills, forgetting her medicines, forgetting people, and even at times forgetting what she was doing.  On the current admission, she was disoriented and confused at admission, had mydriasis and restlessness and dry mouth, didn't know where she was or who medical staff were.  This delirium was likely multifactorial from Ritalin use, Xanax withdrawal in setting of hypoxia and pneumonia and COPD flare. TSH, ammonia, B12 normal.  MRI brain showed atrophy, no acute findings.  Her ritalin and Xanax were held and she was started on low dose clonazepam once daily.  She was progressively more alert and oriented all week, consistent with a toxic metabolic encephalopathy likely from hypoxia in the setting of overuse of duloxetine, Xanax, Ritalin, BuSpar, and mirtazapine.  STOP Xanax Start low dose clonazepam with plan to taper as an outpatient  REDUCE Ritalin by half  Close follow up with Ritalin and Xanax prescribers

## 2021-03-12 NOTE — Assessment & Plan Note (Signed)
No significant end organ damage (I suspect the patients hypoxia is from COPD flare), sepsis and respiratory failure ruled out.  Treated with Rocephin and azithromycin to complete a 5 day course. Patient now mentating at baseline, taking orals.  Temp < 100 F, heart rate < 100bpm, RR < 24, SpO2 at baseline.   Stable for discharge.

## 2021-03-12 NOTE — TOC Transition Note (Signed)
Transition of Care [facility]) - CM/SW Discharge Note   Patient Details  Name: TAIDE TAFUR MRN: [id number] Date of Birth: 05/02/1945  [facility] CM/SW Contact:  Eileen Stanford, LCSW Phone Number: 03/12/2021, 12:19 PM   Clinical Narrative:   Clinical Social Worker facilitated patient discharge including contacting patient family and facility to confirm patient discharge plans.  Clinical information faxed to facility and family agreeable with plan.  CSW arranged ambulance transport via [facility] to [facility] room 705.  RN to call [phone] for report prior to discharge.      Final next level of care: Skilled Nursing Facility Barriers to Discharge: No Barriers Identified   Patient Goals and CMS Choice     Choice offered to / list presented to : Patient, Adult Children  Discharge Placement              Patient chooses bed at:  ([facility]) Patient to be transferred to facility by: [facility] Name of family member notified: daughter Patient and family notified of of transfer: 03/12/21  Discharge Plan and Services   Discharge Planning Services: CM Consult            DME Arranged: N/A DME Agency: NA       HH Arranged: NA HH Agency: NA        Social Determinants of Health (SDOH) Interventions     Readmission Risk Interventions No flowsheet data found.

## 2021-03-12 NOTE — TOC Progression Note (Signed)
Transition of Care [facility]) - Progression Note    Patient Details  Name: Sandra Stout MRN: [id number] Date of Birth: 09/12/1945  [facility] CM/SW Contact  Maree Krabbe, LCSW Phone Number: 03/12/2021, 10:30 AM  Clinical Narrative:    [facility] accepted pt. Auth started will need updated PT note. Covid test ordered--via MD. Md aware.   Expected Discharge Plan: Home w Home Health Services Barriers to Discharge: SNF Pending bed offer  Expected Discharge Plan and Services Expected Discharge Plan: Home w Home Health Services   Discharge Planning Services: CM Consult   Living arrangements for the past 2 months: Single Family Home                 DME Arranged: N/A DME Agency: NA       HH Arranged: NA HH Agency: NA         Social Determinants of Health (SDOH) Interventions    Readmission Risk Interventions No flowsheet data found.

## 2021-03-12 NOTE — Progress Notes (Signed)
Physical Therapy Treatment Patient Details Name: Sandra Stout MRN: [id number] DOB: 1945/07/10 Today's Date: 03/12/2021   History of Present Illness 76 y.o. Caucasian female with medical history significant for anxiety and panic attacks, ADD, narcolepsy, GERD and chronic back pain, who presented to the ER with acute onset of cough over the last 3 to 4 days with associated dyspnea and expectoration of yellowish sputum as well as low-grade fever and chills    PT Comments    Patient received in bed, talking on phone with granddaughter. Concerned about getting to rehab today, wants granddaughter to take her, not ambulance. Patient is agreeable to PT session. She is independent with bed mobility, transfers with min guard and ambulated 150 feet with RW and min guard. Patient fatigued and mildly sob after walking. She will continue to benefit from skilled PT while here to improve strength and independence with mobility.     Recommendations for follow up therapy are one component of a multi-disciplinary discharge planning process, led by the attending physician.  Recommendations may be updated based on patient status, additional functional criteria and insurance authorization.  Follow Up Recommendations  Skilled nursing-short term rehab (<3 hours/day)     Assistance Recommended at Discharge Intermittent Supervision/Assistance  Patient can return home with the following A little help with walking and/or transfers;Direct supervision/assist for medications management;Direct supervision/assist for financial management;Assistance with cooking/housework;Assist for transportation;A little help with bathing/dressing/bathroom;Help with stairs or ramp for entrance   Equipment Recommendations  Other (comment) (TBD)    Recommendations for Other Services       Precautions / Restrictions Precautions Precautions: Fall Restrictions Weight Bearing Restrictions: No     Mobility  Bed Mobility Overal bed  mobility: Modified Independent Bed Mobility: Supine to Sit;Sit to Supine     Supine to sit: Modified independent (Device/Increase time) Sit to supine: Modified independent (Device/Increase time)        Transfers Overall transfer level: Needs assistance Equipment used: Rolling walker (2 wheels) Transfers: Sit to/from Stand Sit to Stand: Min guard                Ambulation/Gait Ambulation/Gait assistance: Min guard Gait Distance (Feet): 150 Feet Assistive device: Rolling walker (2 wheels) Gait Pattern/deviations: Step-through pattern;Decreased step length - right;Decreased step length - left;Narrow base of support Gait velocity: slightly decr     General Gait Details: patient ambulated around nurses station with RW and min guard. Mild fatigue after.   Stairs             Wheelchair Mobility    Modified Rankin (Stroke Patients Only)       Balance Overall balance assessment: Needs assistance Sitting-balance support: Feet supported Sitting balance-Leahy Scale: Good     Standing balance support: Bilateral upper extremity supported;During functional activity;Reliant on assistive device for balance Standing balance-Leahy Scale: Fair                              Cognition Arousal/Alertness: Awake/alert Behavior During Therapy: Anxious;WFL for tasks assessed/performed Overall Cognitive Status: Within Functional Limits for tasks assessed                                 General Comments: less confusion this date. Knows she is going to rehab, speaking to granddaughter on phone on arrival. Appropriate throughout session.        Exercises  General Comments        Pertinent Vitals/Pain Pain Assessment: Faces Faces Pain Scale: Hurts little more Pain Location: low back Pain Descriptors / Indicators: Aching;Discomfort Pain Intervention(s): Monitored during session    Home Living                          Prior  Function            PT Goals (current goals can now be found in the care plan section) Acute Rehab PT Goals PT Goal Formulation: With patient Progress towards PT goals: Progressing toward goals    Frequency    Min 2X/week      PT Plan Current plan remains appropriate    Co-evaluation              AM-PAC PT "6 Clicks" Mobility   Outcome Measure  Help needed turning from your back to your side while in a flat bed without using bedrails?: None Help needed moving from lying on your back to sitting on the side of a flat bed without using bedrails?: None Help needed moving to and from a bed to a chair (including a wheelchair)?: A Little Help needed standing up from a chair using your arms (e.g., wheelchair or bedside chair)?: A Little Help needed to walk in hospital room?: A Little Help needed climbing 3-5 steps with a railing? : A Little 6 Click Score: 20    End of Session Equipment Utilized During Treatment: Gait belt Activity Tolerance: Patient tolerated treatment well Patient left: in bed;with call bell/phone within reach;with bed alarm set Nurse Communication: Mobility status PT Visit Diagnosis: Muscle weakness (generalized) (M62.81);History of falling (Z91.81)     Time: [id number] PT Time Calculation (min) (ACUTE ONLY): 13 min  Charges:  $Gait Training: 8-22 mins                     Rey Dansby, PT, GCS 03/12/21,12:13 PM

## 2021-03-12 NOTE — Care Management Important Message (Signed)
Important Message  Patient Details  Name: KAELEE PFEFFER MRN: [id number] Date of Birth: [birth year]   Medicare Important Message Given:  Other (see comment)  Attempted to review Medicare IM with patient via room phone.  No answer upon attempt.  Previous copy reviewed and left with patient on 03/09/21.  Johnell Comings 03/12/2021, 4:00 PM

## 2021-03-12 NOTE — Assessment & Plan Note (Signed)
>>  ASSESSMENT AND PLAN FOR DEPRESSION, RECURRENT (HCC) WRITTEN ON 03/12/2021 11:58 AM BY DANFORD, Earl Lites, MD

## 2021-03-13 DIAGNOSIS — G629 Polyneuropathy, unspecified: Secondary | ICD-10-CM | POA: Diagnosis not present

## 2021-03-13 DIAGNOSIS — J449 Chronic obstructive pulmonary disease, unspecified: Secondary | ICD-10-CM | POA: Diagnosis not present

## 2021-03-16 DIAGNOSIS — F17200 Nicotine dependence, unspecified, uncomplicated: Secondary | ICD-10-CM | POA: Diagnosis not present

## 2021-03-16 DIAGNOSIS — J441 Chronic obstructive pulmonary disease with (acute) exacerbation: Secondary | ICD-10-CM | POA: Diagnosis not present

## 2021-03-16 DIAGNOSIS — E876 Hypokalemia: Secondary | ICD-10-CM | POA: Diagnosis not present

## 2021-03-16 DIAGNOSIS — G47419 Narcolepsy without cataplexy: Secondary | ICD-10-CM | POA: Diagnosis not present

## 2021-03-16 DIAGNOSIS — G9341 Metabolic encephalopathy: Secondary | ICD-10-CM | POA: Diagnosis not present

## 2021-03-19 DIAGNOSIS — J441 Chronic obstructive pulmonary disease with (acute) exacerbation: Secondary | ICD-10-CM | POA: Diagnosis not present

## 2021-03-20 ENCOUNTER — Telehealth: Payer: Self-pay | Admitting: Family Medicine

## 2021-03-20 NOTE — Telephone Encounter (Signed)
Copied from CRM [id number]. Topic: Medicare AWV >> Mar 20, 2021  2:47 PM Leigh Aurora wrote: Reason for CRM:   Left message for patient to call back and schedule the Medicare Annual Wellness Visit (AWV) virtually or by telephone.  Last AWV 01/10/20  Please schedule at anytime with [facility] Health Advisor.  45 minute appointment  Any questions, please call me at [phone]

## 2021-03-22 DIAGNOSIS — J441 Chronic obstructive pulmonary disease with (acute) exacerbation: Secondary | ICD-10-CM | POA: Diagnosis not present

## 2021-03-23 DIAGNOSIS — J441 Chronic obstructive pulmonary disease with (acute) exacerbation: Secondary | ICD-10-CM | POA: Diagnosis not present

## 2021-03-23 DIAGNOSIS — M6281 Muscle weakness (generalized): Secondary | ICD-10-CM | POA: Diagnosis not present

## 2021-03-27 DIAGNOSIS — F339 Major depressive disorder, recurrent, unspecified: Secondary | ICD-10-CM

## 2021-05-14 ENCOUNTER — Telehealth: Payer: Self-pay | Admitting: Family Medicine

## 2021-05-14 ENCOUNTER — Telehealth: Payer: Self-pay | Admitting: Licensed Clinical Social Worker

## 2021-05-14 ENCOUNTER — Telehealth: Payer: Medicare Other

## 2021-05-14 NOTE — Telephone Encounter (Signed)
Copied from CRM [id number]. Topic: Medicare AWV ?>> May 14, 2021 10:44 AM Leigh Aurora wrote: ?Reason for CRM:  ?N/A unable to leave a message for patient to call back and schedule the Medicare Annual Wellness Visit (AWV) virtually or by telephone. ? ?Last AWV 01/10/20 ? ?Please schedule at anytime with [facility] Health Advisor. ? ?45 minute appointment ? ?Any questions, please call me at [phone] ?

## 2021-05-14 NOTE — Telephone Encounter (Signed)
? ?   Clinical Social Work  ?Care Management  ? Phone Outreach  ? ? ?05/14/2021 ?Name: [name] MRN: [id number] DOB: 10-06-1945 ? ?[name] is a 76 y.o. year old female who is a primary care patient of Valerie Roys, DO .  ? ?Reason for referral: Mental Health Counseling and Resources.   ? ?F/U phone call today to assess needs, progress and barriers with care plan goals.   Telephone outreach was unsuccessful. Unable to leave a HIPPA compliant phone message due to voice mail not set up. ? ?Plan: Per chart review, pt has been admitted to SNF. LCSW will route chart to Care Guide to see if patient would like to reschedule phone appointment  ? ?Review of patient status, including review of consultants reports, relevant laboratory and other test results, and collaboration with appropriate care team members and the patient's provider was performed as part of comprehensive patient evaluation and provision of care management services.   ? ? ?Christa See, MSW, LCSW ?[facility] ?[facility] ?[email or web address] ?Phone [phone] ?2:40 PM ? ? ?  ? ? ? ? ?

## 2021-06-15 ENCOUNTER — Telehealth: Payer: Self-pay

## 2021-06-15 ENCOUNTER — Ambulatory Visit (INDEPENDENT_AMBULATORY_CARE_PROVIDER_SITE_OTHER): Payer: Medicare Other | Admitting: Family Medicine

## 2021-06-15 ENCOUNTER — Encounter: Payer: Self-pay | Admitting: Family Medicine

## 2021-06-15 VITALS — BP 129/79 | HR 98 | Wt 104.8 lb

## 2021-06-15 DIAGNOSIS — N8110 Cystocele, unspecified: Secondary | ICD-10-CM | POA: Diagnosis not present

## 2021-06-15 DIAGNOSIS — N949 Unspecified condition associated with female genital organs and menstrual cycle: Secondary | ICD-10-CM | POA: Diagnosis not present

## 2021-06-15 DIAGNOSIS — R8281 Pyuria: Secondary | ICD-10-CM

## 2021-06-15 DIAGNOSIS — Z5982 Transportation insecurity: Secondary | ICD-10-CM

## 2021-06-15 DIAGNOSIS — G8929 Other chronic pain: Secondary | ICD-10-CM

## 2021-06-15 DIAGNOSIS — M545 Low back pain, unspecified: Secondary | ICD-10-CM | POA: Diagnosis not present

## 2021-06-15 DIAGNOSIS — H2513 Age-related nuclear cataract, bilateral: Secondary | ICD-10-CM | POA: Diagnosis not present

## 2021-06-15 LAB — MICROSCOPIC EXAMINATION: RBC, Urine: NONE SEEN /hpf (ref 0–2)

## 2021-06-15 LAB — WET PREP FOR TRICH, YEAST, CLUE
Clue Cell Exam: NEGATIVE
Trichomonas Exam: NEGATIVE
Yeast Exam: NEGATIVE

## 2021-06-15 LAB — URINALYSIS, ROUTINE W REFLEX MICROSCOPIC
Bilirubin, UA: NEGATIVE
Glucose, UA: NEGATIVE
Ketones, UA: NEGATIVE
Nitrite, UA: NEGATIVE
Protein,UA: NEGATIVE
RBC, UA: NEGATIVE
Specific Gravity, UA: 1.015 (ref 1.005–1.030)
Urobilinogen, Ur: 1 mg/dL (ref 0.2–1.0)
pH, UA: 7 (ref 5.0–7.5)

## 2021-06-15 NOTE — Telephone Encounter (Signed)
Called patient to relay her appointment with Uro/Gyn specialist per Dr. Laural Benes with: ? ?Sandra Cradle NP ? ?07/20/21 at 9:00   ? ?Address: [address], [place], [place]  ?Phone: [phone] ? ? ? ? ? ? ? ?

## 2021-06-15 NOTE — Assessment & Plan Note (Signed)
Acting up again. Has not had imaging done since 2021. Will get x-ray and start stretches. May need PT. Follow up 1 month.  ?

## 2021-06-15 NOTE — Telephone Encounter (Signed)
? ?  Telephone encounter was:  Successful.  ?06/15/2021 ?Name: Sandra Stout MRN: [id number] DOB: 1945/03/12 ? ?Sandra Stout is a 76 y.o. year old female who is a primary care patient of [name], DO . The community resource team was consulted for assistance with Transportation Needs  ? ?Care guide performed the following interventions: Patient provided with information about care guide support team and interviewed to confirm resource needs. Pt needs transportation when her family members are not available. ? ?Follow Up Plan:  Care guide will outreach resources to assist patient with transportation by contacting [facility] to inquire if patient has a transportation benefit ? ?Hessie Knows ?Care Guide, Embedded Care Coordination ?[facility]  Care management  ?[place], [place] [address]  ?Main Phone: [phone]  E-mail: [email or web address]  ?Website: [email or web address] ? ? ? ?

## 2021-06-15 NOTE — Progress Notes (Signed)
? ?BP 129/79   Pulse 98   Wt 104 lb 12.8 oz (47.5 kg)   LMP  (LMP Unknown)   SpO2 95%   BMI 19.17 kg/m?   ? ?Subjective:  ? ? Patient ID: Sandra Stout, female    DOB: Mar 18, 1945, 76 y.o.   MRN: [id number] ? ?HPI: ?Sandra Stout is a 76 y.o. female ? ?Chief Complaint  ?Patient presents with  ? Vaginal Burning  ?  Patient states she feels burning on the outer and inner area of her vagina   ? ?VAGINAL BURNING ?Duration: over a month ?Discharge description:  normal amount   ?Pruritus: yes ?Dysuria: yes ?Malodorous: yes ?Urinary frequency: yes ?Fevers: no ?Abdominal pain: no  ?History of sexually transmitted diseases: no ?Recent antibiotic use: no ?Context: worse  ?Treatments attempted: antifungal ? ?BACK PAIN ?Duration: chronic ?Mechanism of injury: unknown ?Location: bilateral and low back ?Onset: gradual ?Severity: severe ?Quality: aching and sore ?Frequency: constant ?Radiation: none ?Aggravating factors: lifting and movement ?Alleviating factors: nothing ?Status: worse ?Treatments attempted: rest, ice, heat, APAP, ibuprofen, and aleve  ?Relief with NSAIDs?: no ?Nighttime pain:  no ?Paresthesias / decreased sensation:  no ?Bowel / bladder incontinence:  no ?Fevers:  no ?Dysuria / urinary frequency:  yes ? ? ?Relevant past medical, surgical, family and social history reviewed and updated as indicated. Interim medical history since our last visit reviewed. ?Allergies and medications reviewed and updated. ? ?Review of Systems  ?Respiratory: Negative.    ?Cardiovascular: Negative.   ?Gastrointestinal: Negative.   ?Genitourinary:  Negative for decreased urine volume, difficulty urinating, dyspareunia, dysuria, enuresis, flank pain, frequency, genital sores, hematuria, menstrual problem, pelvic pain, urgency, vaginal bleeding, vaginal discharge and vaginal pain.  ?Musculoskeletal: Negative.   ?Psychiatric/Behavioral: Negative.    ? ?Per HPI unless specifically indicated above ? ?   ?Objective:  ?  ?BP 129/79   Pulse 98    Wt 104 lb 12.8 oz (47.5 kg)   LMP  (LMP Unknown)   SpO2 95%   BMI 19.17 kg/m?   ?Wt Readings from Last 3 Encounters:  ?06/15/21 104 lb 12.8 oz (47.5 kg)  ?03/07/21 115 lb (52.2 kg)  ?01/10/20 100 lb (45.4 kg)  ?  ?Physical Exam ?Vitals and nursing note reviewed. Exam conducted with a chaperone present.  ?Constitutional:   ?   General: She is not in acute distress. ?   Appearance: Normal appearance. She is not ill-appearing, toxic-appearing or diaphoretic.  ?HENT:  ?   Head: Normocephalic and atraumatic.  ?   Right Ear: External ear normal.  ?   Left Ear: External ear normal.  ?   Nose: Nose normal.  ?   Mouth/Throat:  ?   Mouth: Mucous membranes are moist.  ?   Pharynx: Oropharynx is clear.  ?Eyes:  ?   General: No scleral icterus.    ?   Right eye: No discharge.     ?   Left eye: No discharge.  ?   Extraocular Movements: Extraocular movements intact.  ?   Conjunctiva/sclera: Conjunctivae normal.  ?   Pupils: Pupils are equal, round, and reactive to light.  ?Cardiovascular:  ?   Rate and Rhythm: Normal rate and regular rhythm.  ?   Pulses: Normal pulses.  ?   Heart sounds: Normal heart sounds. No murmur heard. ?  No friction rub. No gallop.  ?Pulmonary:  ?   Effort: Pulmonary effort is normal. No respiratory distress.  ?   Breath sounds: Normal breath sounds. No  stridor. No wheezing, rhonchi or rales.  ?Chest:  ?   Chest wall: No tenderness.  ?Abdominal:  ?   Hernia: There is no hernia in the left inguinal area or right inguinal area.  ?Genitourinary: ?   Exam position: Supine.  ?   Labia:     ?   Right: No rash, tenderness, lesion or injury.     ?   Left: No rash, tenderness, lesion or injury.   ?   Urethra: Prolapse, urethral pain and urethral lesion present.  ?Musculoskeletal:     ?   General: Normal range of motion.  ?   Cervical back: Normal range of motion and neck supple.  ?Lymphadenopathy:  ?   Lower Body: No right inguinal adenopathy. No left inguinal adenopathy.  ?Skin: ?   General: Skin is warm and  dry.  ?   Capillary Refill: Capillary refill takes less than 2 seconds.  ?   Coloration: Skin is not jaundiced or pale.  ?   Findings: No bruising, erythema, lesion or rash.  ?   Comments: Labial area is normal, no redness or rashes  ?Neurological:  ?   General: No focal deficit present.  ?   Mental Status: She is alert and oriented to person, place, and time. Mental status is at baseline.  ?Psychiatric:     ?   Mood and Affect: Mood normal.     ?   Behavior: Behavior normal.     ?   Thought Content: Thought content normal.     ?   Judgment: Judgment normal.  ? ? ?Results for orders placed or performed in visit on 06/15/21  ?Microscopic Examination  ?Result Value Ref Range  ? WBC, UA 0-5 0 - 5 /hpf  ? RBC None seen 0 - 2 /hpf  ? Epithelial Cells (non renal) 0-10 0 - 10 /hpf  ? Bacteria, UA Few (A) None seen/Few  ?WET PREP FOR TRICH, YEAST, CLUE  ? Urine  ?Result Value Ref Range  ? Trichomonas Exam Negative Negative  ? Yeast Exam Negative Negative  ? Clue Cell Exam Negative Negative  ?Urinalysis, Routine w reflex microscopic  ?Result Value Ref Range  ? Specific Gravity, UA 1.015 1.005 - 1.030  ? pH, UA 7.0 5.0 - 7.5  ? Color, UA Yellow Yellow  ? Appearance Ur Clear Clear  ? Leukocytes,UA Trace (A) Negative  ? Protein,UA Negative Negative/Trace  ? Glucose, UA Negative Negative  ? Ketones, UA Negative Negative  ? RBC, UA Negative Negative  ? Bilirubin, UA Negative Negative  ? Urobilinogen, Ur 1.0 0.2 - 1.0 mg/dL  ? Nitrite, UA Negative Negative  ? Microscopic Examination See below:   ? ?   ?Assessment & Plan:  ? ?Problem List Items Addressed This Visit   ? ?  ? Genitourinary  ? Vaginal prolapse - Primary  ?  Causing significant discomfort. Unclear if this is from lumbar radiculopathy or prolapse. Will get her into urogyn. Await their input. Referral generated today. ? ?  ?  ? Relevant Orders  ? Ambulatory referral to Urogynecology  ?  ? Other  ? Chronic back pain  ?  Acting up again. Has not had imaging done since  2021. Will get x-ray and start stretches. May need PT. Follow up 1 month.  ? ?  ?  ? Relevant Medications  ? mirtazapine (REMERON) 15 MG tablet  ? Other Relevant Orders  ? DG Lumbar Spine Complete  ? ?Other Visit Diagnoses   ? ?  Vaginal burning      ? Vaginal area looks dry, concern for atrophic vaginitis, but significant prolapse- likely this is the cause of pain. Will get her into urogyn.   ? Relevant Orders  ? WET PREP FOR TRICH, YEAST, CLUE  ? Urinalysis, Routine w reflex microscopic (Completed)  ? Inability to acquire transportation      ? Relevant Orders  ? AMB Referral to [facility] Coordinaton  ? Pyuria      ? Await culture.   ? Relevant Orders  ? Urine Culture  ? ?  ?  ? ?Follow up plan: ?Return in about 4 weeks (around 07/13/2021). ? ? ? ? ? ?

## 2021-06-15 NOTE — Assessment & Plan Note (Signed)
Causing significant discomfort. Unclear if this is from lumbar radiculopathy or prolapse. Will get her into urogyn. Await their input. Referral generated today. ?

## 2021-06-19 ENCOUNTER — Other Ambulatory Visit: Payer: Self-pay | Admitting: Family Medicine

## 2021-06-19 ENCOUNTER — Ambulatory Visit
Admission: RE | Admit: 2021-06-19 | Discharge: 2021-06-19 | Disposition: A | Discharge status: Home / Self Care | Payer: Medicare Other | Attending: Family Medicine | Admitting: Family Medicine

## 2021-06-19 ENCOUNTER — Telehealth: Payer: Self-pay | Admitting: Family Medicine

## 2021-06-19 ENCOUNTER — Ambulatory Visit
Admission: RE | Admit: 2021-06-19 | Discharge: 2021-06-19 | Disposition: A | Discharge status: Home / Self Care | Payer: Medicare Other | Source: Ambulatory Visit | Attending: Family Medicine | Admitting: Family Medicine

## 2021-06-19 DIAGNOSIS — G8929 Other chronic pain: Secondary | ICD-10-CM | POA: Insufficient documentation

## 2021-06-19 DIAGNOSIS — M4126 Other idiopathic scoliosis, lumbar region: Secondary | ICD-10-CM | POA: Diagnosis not present

## 2021-06-19 DIAGNOSIS — M545 Low back pain, unspecified: Secondary | ICD-10-CM | POA: Insufficient documentation

## 2021-06-19 MED ORDER — CIPROFLOXACIN HCL 500 MG PO TABS: 500.0000 mg | ORAL_TABLET | Freq: Two times a day (BID) | ORAL | 0 refills | Status: DC

## 2021-06-19 NOTE — Telephone Encounter (Signed)
Medication Refill - Medication:  ? ?*Pt was recently seen about her back, and was wanting to get some pain medication, please advise.* ? ?Has the patient contacted their pharmacy? No. ? ? ?Preferred Pharmacy (with phone number or street name):  ?[facility] - [place], [place] - [address] Phone:  [phone]  ?Fax:  [phone]  ?  ? ? ?Has the patient been seen for an appointment in the last year OR does the patient have an upcoming appointment? Yes.  Today - 06/19/21 ? ?Agent: Please be advised that RX refills may take up to 3 business days. We ask that you follow-up with your pharmacy. ?

## 2021-06-19 NOTE — Telephone Encounter (Signed)
Routing to provider to advise.  

## 2021-06-19 NOTE — Telephone Encounter (Signed)
Pain medicine is not appropriate, but it does look like her urine culture grew out some bacteria so I've sent her an antibiotic to her pharmacy. ?

## 2021-06-20 ENCOUNTER — Telehealth: Payer: Self-pay

## 2021-06-20 LAB — URINE CULTURE

## 2021-06-20 NOTE — Telephone Encounter (Signed)
Spoke with patient and notified her of Dr.Johnson's recommendations. Patient verbalized understanding. Patient states she is scheduled to see Urogynecologist on 07/20/21. Patient states she no longer drives and her granddaughter is her source of transportation and she recently moved to [place]. Patient states there is no way her granddaughter can come from [place] and then get her and then be in [place] by 9 am for her appointment. Patient is asking is there anyway we could find her a closer office in [place] or [place] area. Please advise? ?

## 2021-06-20 NOTE — Telephone Encounter (Signed)
I've put in a referral for [facility] for help with transportation, but I'm not sure if she's heard from them- can we please see about help with transportation? ? ?Her other specialists are at [facility] which is why we referred her there, but if there is another location closer, I'm OK with changing the referral.  ?

## 2021-06-20 NOTE — Telephone Encounter (Signed)
? ?  Telephone encounter was:  Successful.  ?06/20/2021 ?Name: Sandra Stout MRN: [id number] DOB: 19-Oct-1945 ? ?Sandra Stout is a 76 y.o. year old female who is a primary care patient of [name], DO . The community resource team was consulted for assistance with Transportation Needs  ? ?Care guide performed the following interventions: Discussed resources to assist with transportation . Pt and Daughter have been advised: Pt has a transportation benefit with her [employer]. This is no charge to her.There are 24-one-way trips (12 rides) that she can use. Essentially for one appointment, two rides will be used. Rides can be booked two weeks in advance at the maximum, or three days in advance at the minimum.The number for Transportation is [phone].  ? ?Additional vendors have also been provided for [employer]. ?[employer] - [phone] ?[employer] [phone] ?[employer] - [phone] ?[employer] - [phone] ? ?Follow Up Plan:  No further follow up planned at this time. The patient has been provided with needed resources. This information has been e-mailed to daughter as well. ? ?Hessie Knows ?Care Guide, Embedded Care Coordination ?[facility]  Care management  ?[place], [place] [address]  ?Main Phone: [phone]  E-mail: [email or web address]  ?Website: [email or web address] ? ? ? ?

## 2021-06-22 ENCOUNTER — Other Ambulatory Visit: Payer: Self-pay | Admitting: Family Medicine

## 2021-06-22 DIAGNOSIS — N8110 Cystocele, unspecified: Secondary | ICD-10-CM

## 2021-06-22 DIAGNOSIS — G47419 Narcolepsy without cataplexy: Secondary | ICD-10-CM

## 2021-06-22 DIAGNOSIS — R296 Repeated falls: Secondary | ICD-10-CM

## 2021-06-22 DIAGNOSIS — M545 Low back pain, unspecified: Secondary | ICD-10-CM

## 2021-06-25 ENCOUNTER — Telehealth: Payer: Self-pay | Admitting: Family Medicine

## 2021-06-25 NOTE — Telephone Encounter (Signed)
Copied from CRM [id number]. Topic: Referral - Question ?>> Jun 22, 2021  4:21 PM Stout, Sandra Glenn A wrote: ?Reason for CRM: Pt wanted to be seen somewhere closer than [facility] in [place] for her REFERRAL TO UROGYNECOLOGY, please advise if a different referral can be sent in. ?

## 2021-06-28 ENCOUNTER — Telehealth: Payer: Self-pay | Admitting: Family Medicine

## 2021-06-28 DIAGNOSIS — G47419 Narcolepsy without cataplexy: Secondary | ICD-10-CM | POA: Diagnosis not present

## 2021-06-28 NOTE — Telephone Encounter (Signed)
Copied from CRM [id number]. Topic: General - Other ?>> Jun 28, 2021  1:30 PM Jaquita Rector A wrote: ?Reason for CRM: [place] with [facility] called in to inform Olevia Perches that they received the referral for home care assistance but patient denied care. Please call [place] with further questions  Ph# [phone] ?

## 2021-06-28 NOTE — Telephone Encounter (Signed)
FYI to provider

## 2021-07-04 ENCOUNTER — Ambulatory Visit (INDEPENDENT_AMBULATORY_CARE_PROVIDER_SITE_OTHER): Payer: Medicare Other | Admitting: Licensed Clinical Social Worker

## 2021-07-04 DIAGNOSIS — F339 Major depressive disorder, recurrent, unspecified: Secondary | ICD-10-CM

## 2021-07-04 DIAGNOSIS — J441 Chronic obstructive pulmonary disease with (acute) exacerbation: Secondary | ICD-10-CM

## 2021-07-04 DIAGNOSIS — F419 Anxiety disorder, unspecified: Secondary | ICD-10-CM

## 2021-07-04 DIAGNOSIS — G8929 Other chronic pain: Secondary | ICD-10-CM

## 2021-07-05 NOTE — Chronic Care Management (AMB) (Signed)
?Chronic Care Management  ? ? Clinical Social Work Note ? ?07/05/2021 ?Name: Sandra Stout MRN: [id number] DOB: 03-16-1945 ? ?Sandra Stout is a 76 y.o. year old female who is a primary care patient of [name], DO. The CCM team was consulted to assist the patient with chronic disease management and/or care coordination needs related to: Mental Health Counseling and Resources.  ? ?Engaged with patient by telephone for follow up visit in response to provider referral for social work chronic care management and care coordination services.  ? ?Consent to Services:  ?The patient was given information about Chronic Care Management services, agreed to services, and gave verbal consent prior to initiation of services.  Please see initial visit note for detailed documentation.  ? ?Patient agreed to services and consent obtained.  ? ?Assessment: Review of patient past medical history, allergies, medications, and health status, including review of relevant consultants reports was performed today as part of a comprehensive evaluation and provision of chronic care management and care coordination services.    ? ?SDOH (Social Determinants of Health) assessments and interventions performed:   ? ?Advanced Directives Status: Not addressed in this encounter. ? ?CCM Care Plan ? ?No Known Allergies ? ?Outpatient Encounter Medications as of 07/04/2021  ?Medication Sig  ? albuterol (VENTOLIN HFA) 108 (90 Base) MCG/ACT inhaler Inhale 2 puffs into the lungs every 6 (six) hours as needed for wheezing or shortness of breath.  ? busPIRone (BUSPAR) 10 MG tablet Take 1 tablet (10 mg total) by mouth 2 (two) times daily.  ? ciprofloxacin (CIPRO) 500 MG tablet Take 1 tablet (500 mg total) by mouth 2 (two) times daily.  ? clonazePAM (KLONOPIN) 0.5 MG disintegrating tablet Take 1 tablet (0.5 mg total) by mouth daily.  ? co-enzyme Q-10 50 MG capsule Take 50 mg by mouth daily.  ? fexofenadine (ALLEGRA ALLERGY) 180 MG tablet Take 1 tablet (180 mg  total) by mouth daily. (Patient taking differently: Take 180 mg by mouth daily as needed for allergies.)  ? guaiFENesin (MUCINEX) 600 MG 12 hr tablet Take 1 tablet (600 mg total) by mouth 2 (two) times daily. (Patient not taking: Reported on 06/15/2021)  ? methylphenidate (RITALIN) 10 MG tablet Take 1 tablet (10 mg total) by mouth 3 (three) times daily with meals.  ? mirtazapine (REMERON) 15 MG tablet Take 15 mg by mouth at bedtime.  ? Omega-3 Fatty Acids (FISH OIL) 1000 MG CAPS Take by mouth.  ? traZODone (DESYREL) 50 MG tablet Take 0.5 tablets (25 mg total) by mouth at bedtime as needed for sleep.  ? Vitamins-Lipotropics (BALANCED B-50) TABS Take by mouth.  ? ?No facility-administered encounter medications on file as of 07/04/2021.  ? ? ?Patient Active Problem List  ? Diagnosis Date Noted  ? Vaginal prolapse 06/15/2021  ? Acute metabolic encephalopathy 03/09/2021  ? COPD with acute exacerbation (HCC) 03/09/2021  ? Hypokalemia 03/08/2021  ? Right middle lobe pneumonia 03/07/2021  ? Upper respiratory tract infection 03/02/2021  ? Wheezing 03/02/2021  ? Recurrent falls 10/15/2018  ? Depression, recurrent (HCC) 02/03/2018  ? Chronic back pain 08/17/2014  ? GERD (gastroesophageal reflux disease) 08/17/2014  ? Anxiety   ? 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 ? ?Conditions to be addressed/monitored: Anxiety, Depression, and Chronic Back Pain ; Mental Health Concerns  ? ?Care Plan : Clinical Social Work  ?Updates made by Bridgett Larsson, LCSW since 07/05/2021 12:00 AM  ?  ? ?Problem: Management of Depression and Anxiety Symptoms   ?  ? ?  Goal: Coping Skills Enhanced   ?Start Date: 06/22/2020  ?Expected End Date: 10/25/2021  ?This Visit's Progress: On track  ?Recent Progress: On track  ?Priority: High  ?Note:   ?Current barriers:   ?Chronic Mental Health needs related to Depression and Anxiety ?Financial constraints related to housing and Mental Health Concerns  ?Needs Support, Education, and Care Coordination in  order to meet unmet mental health needs ?Clinical Goal(s): Over the next 60 days, patient will work with SW to reduce or manage symptoms of depression and grief  and increase knowledge and/or ability of: coping skills and self-management skills ?Clinical Interventions:  ?Assessed patient's previous treatment, needs, coping skills, current treatment, support system and barriers to care ?Pt has been registered for transportation thru [employer], in the event family can not transport her to medical appts ?Pt has requested an in-network provider booklet from [employer] to assist with obtaining info on local specialists. Pt would prefer an appt sooner than Sept ?Pt endorses an increase in anxiety triggered by pain and limited mobility. LCSW discussed strategies to assist with stress management ?sleep and appetite has remained the same ?CCM LCSW discussed ways to establish a plan to cope with future triggers which could negatively impact pain and functioning.  ?Patient continues to participate in sessions with psychiatrist via phone monthly. Patient feels that he is very compassionate and has been a help with management of symptoms ?Patient reports compliance with medications  ?CCM LCSW discussed benefits of Gratitude Thinking to promote positive mood and productivity. Pt shared, "Im trying my best to think positive and focus on how things will get better" ?Patient receives strong support from family to assist with running errands and maintaining home  ?Patient agreed to schedule an appointment with PCP should health conditions worsen ?Other interventions: Active listening / Reflection utilized , Emotional Supportive Provided, Psychoeducation /Health Education, Brief CBT , and Provided basic mental health support, education    ?Collaboration with PCP regarding development and update of comprehensive plan of care as evidenced by provider attestation and co-signature ?Inter-disciplinary care team collaboration  (see longitudinal plan of care) ?Patient Goals/Self-Care Activities: Over the next 90 days ?Practice Gratitude Thinking to promote positive mood ?Talk about feelings with a friend, family or spiritual advisor ?Practice positive thinking and self-talk ?Continue with therapy and compliance of taking medication  ?  ? ?  ?  ? ?Follow Up Plan: Appointment scheduled for SW follow up with client by phone on: 09/03/21 ? ?Jenel Lucks, MSW, LCSW ?[facility] ?[place]  [facility] ?[email or web address] ?Phone [phone] ?12:56 PM ? ? ? ?

## 2021-07-05 NOTE — Patient Instructions (Signed)
Visit Information ? ?Thank you for taking time to visit with me today. Please don't hesitate to contact me if I can be of assistance to you before our next scheduled telephone appointment. ? ?Following are the goals we discussed today:  ?Patient Goals/Self-Care Activities: Over the next 90 days ?Practice Gratitude Thinking to promote positive mood ?Talk about feelings with a friend, family or spiritual advisor ?Practice positive thinking and self-talk ?Continue with therapy and compliance of taking medication  ? ?Our next appointment is by telephone on 09/03/21 at 1:00 PM ? ?Please call the care guide team at [phone] if you need to cancel or reschedule your appointment.  ? ?If you are experiencing a Mental Health or Behavioral Health Crisis or need someone to talk to, please call 911  ? ?Patient verbalizes understanding of instructions and care plan provided today and agrees to view in MyChart. Active MyChart status confirmed with patient.   ? ?Jenel Lucks, MSW, LCSW ?[facility] ?[place]  [facility] ?[email or web address] ?Phone [phone] ?12:54 PM ?  ?

## 2021-07-11 ENCOUNTER — Ambulatory Visit (INDEPENDENT_AMBULATORY_CARE_PROVIDER_SITE_OTHER): Payer: Medicare Other | Admitting: *Deleted

## 2021-07-11 DIAGNOSIS — Z0000 Encounter for general adult medical examination without abnormal findings: Secondary | ICD-10-CM | POA: Diagnosis not present

## 2021-07-11 NOTE — Patient Instructions (Addendum)
Sandra Stout , ?Thank you for taking time to come for your Medicare Wellness Visit. I appreciate your ongoing commitment to your health goals. Please review the following plan  discussed and let me know if I can assist you in the future.  ? ?Screening recommendations/referrals: ?Colonoscopy: Education provided ?Mammogram: Education provided ?Bone Density: declined ?Recommended yearly ophthalmology/optometry visit for glaucoma screening and checkup ?Recommended yearly dental visit for hygiene and checkup ? ?Vaccinations: ?Influenza vaccine: Education provided ?Pneumococcal vaccine: up to date ?Tdap vaccine: Education provided ?Shingles vaccine: Education provided   ? ?Advanced directives: Education provided ? ?Conditions/risks identified:  ? ?Next appointment: 07-16-2021 @ 11:20  Laural Benes ? ? ?Preventive Care 71 Years and Older, Female ?Preventive care refers to lifestyle choices and visits with your health care provider that can promote health and wellness. ?What does preventive care include? ?A yearly physical exam. This is also called an annual well check. ?Dental exams once or twice a year. ?Routine eye exams. Ask your health care provider how often you should have your eyes checked. ?Personal lifestyle choices, including: ?Daily care of your teeth and gums. ?Regular physical activity. ?Eating a healthy diet. ?Avoiding tobacco and drug use. ?Limiting alcohol use. ?Practicing safe sex. ?Taking low-dose aspirin every day. ?Taking vitamin and mineral supplements as recommended by your health care provider. ?What happens during an annual well check? ?The services and screenings done by your health care provider during your annual well check will depend on your age, overall health, lifestyle risk factors, and family history of disease. ?Counseling  ?Your health care provider may ask you questions about your: ?Alcohol use. ?Tobacco use. ?Drug use. ?Emotional well-being. ?Home and relationship well-being. ?Sexual  activity. ?Eating habits. ?History of falls. ?Memory and ability to understand (cognition). ?Work and work Astronomer. ?Reproductive health. ?Screening  ?You may have the following tests or measurements: ?Height, weight, and BMI. ?Blood pressure. ?Lipid and cholesterol levels. These may be checked every 5 years, or more frequently if you are over 82 years old. ?Skin check. ?Lung cancer screening. You may have this screening every year starting at age 52 if you have a 30-pack-year history of smoking and currently smoke or have quit within the past 15 years. ?Fecal occult blood test (FOBT) of the stool. You may have this test every year starting at age 59. ?Flexible sigmoidoscopy or colonoscopy. You may have a sigmoidoscopy every 5 years or a colonoscopy every 10 years starting at age 69. ?Hepatitis C blood test. ?Hepatitis B blood test. ?Sexually transmitted disease (STD) testing. ?Diabetes screening. This is done by checking your blood sugar (glucose) after you have not eaten for a while (fasting). You may have this done every 1-3 years. ?Bone density scan. This is done to screen for osteoporosis. You may have this done starting at age 59. ?Mammogram. This may be done every 1-2 years. Talk to your health care provider about how often you should have regular mammograms. ?Talk with your health care provider about your test results, treatment options, and if necessary, the need for more tests. ?Vaccines  ?Your health care provider may recommend certain vaccines, such as: ?Influenza vaccine. This is recommended every year. ?Tetanus, diphtheria, and acellular pertussis (Tdap, Td) vaccine. You may need a Td booster every 10 years. ?Zoster vaccine. You may need this after age 42. ?Pneumococcal 13-valent conjugate (PCV13) vaccine. One dose is recommended after age 33. ?Pneumococcal polysaccharide (PPSV23) vaccine. One dose is recommended after age 6. ?Talk to your health care provider about which screenings and vaccines  you need and how often you need them. ?This information is not intended to replace advice given to you by your health care provider. Make sure you discuss any questions you have with your health care provider. ?Document Released: 03/10/2015 Document Revised: 11/01/2015 Document Reviewed: 12/13/2014 ?Elsevier Interactive Patient Education ? 2017 [employer] ? ?Fall Prevention in the Home ?Falls can cause injuries. They can happen to people of all ages. There are many things you can do to make your home safe and to help prevent falls. ?What can I do on the outside of my home? ?Regularly fix the edges of walkways and driveways and fix any cracks. ?Remove anything that might make you trip as you walk through a door, such as a raised step or threshold. ?Trim any bushes or trees on the path to your home. ?Use bright outdoor lighting. ?Clear any walking paths of anything that might make someone trip, such as rocks or tools. ?Regularly check to see if handrails are loose or broken. Make sure that both sides of any steps have handrails. ?Any raised decks and porches should have guardrails on the edges. ?Have any leaves, snow, or ice cleared regularly. ?Use sand or salt on walking paths during winter. ?Clean up any spills in your garage right away. This includes oil or grease spills. ?What can I do in the bathroom? ?Use night lights. ?Install grab bars by the toilet and in the tub and shower. Do not use towel bars as grab bars. ?Use non-skid mats or decals in the tub or shower. ?If you need to sit down in the shower, use a plastic, non-slip stool. ?Keep the floor dry. Clean up any water that spills on the floor as soon as it happens. ?Remove soap buildup in the tub or shower regularly. ?Attach bath mats securely with double-sided non-slip rug tape. ?Do not have throw rugs and other things on the floor that can make you trip. ?What can I do in the bedroom? ?Use night lights. ?Make sure that you have a light by your bed that  is easy to reach. ?Do not use any sheets or blankets that are too big for your bed. They should not hang down onto the floor. ?Have a firm chair that has side arms. You can use this for support while you get dressed. ?Do not have throw rugs and other things on the floor that can make you trip. ?What can I do in the kitchen? ?Clean up any spills right away. ?Avoid walking on wet floors. ?Keep items that you use a lot in easy-to-reach places. ?If you need to reach something above you, use a strong step stool that has a grab bar. ?Keep electrical cords out of the way. ?Do not use floor polish or wax that makes floors slippery. If you must use wax, use non-skid floor wax. ?Do not have throw rugs and other things on the floor that can make you trip. ?What can I do with my stairs? ?Do not leave any items on the stairs. ?Make sure that there are handrails on both sides of the stairs and use them. Fix handrails that are broken or loose. Make sure that handrails are as long as the stairways. ?Check any carpeting to make sure that it is firmly attached to the stairs. Fix any carpet that is loose or worn. ?Avoid having throw rugs at the top or bottom of the stairs. If you do have throw rugs, attach them to the floor with carpet tape. ?Make sure that  you have a light switch at the top of the stairs and the bottom of the stairs. If you do not have them, ask someone to add them for you. ?What else can I do to help prevent falls? ?Wear shoes that: ?Do not have high heels. ?Have rubber bottoms. ?Are comfortable and fit you well. ?Are closed at the toe. Do not wear sandals. ?If you use a stepladder: ?Make sure that it is fully opened. Do not climb a closed stepladder. ?Make sure that both sides of the stepladder are locked into place. ?Ask someone to hold it for you, if possible. ?Clearly mark and make sure that you can see: ?Any grab bars or handrails. ?First and last steps. ?Where the edge of each step is. ?Use tools that help you  move around (mobility aids) if they are needed. These include: ?Canes. ?Walkers. ?Scooters. ?Crutches. ?Turn on the lights when you go into a dark area. Replace any light bulbs as soon as they burn out. ?Set up y

## 2021-07-11 NOTE — Progress Notes (Signed)
Subjective:   Sandra Stout is a 76 y.o. female who presents for Medicare Annual (Subsequent) preventive examination.  I connected with  Sandra Stout on 07/11/21 by a telephone enabled telemedicine application and verified that I am speaking with the correct person using two identifiers.   I discussed the limitations of evaluation and management by telemedicine. The patient expressed understanding and agreed to proceed.  Patient location: home  Provider location:  Tele-Health-home    Review of Systems      Cardiac Risk Factors include: advanced age (>51men, >80 women);sedentary lifestyle     Objective:    Today's Vitals   07/11/21 1205  PainSc: 8    There is no height or weight on file to calculate BMI.     07/11/2021   12:07 PM 01/10/2020    3:35 PM 10/06/2018   10:00 PM 10/06/2018    4:09 PM 02/21/2016    3:37 AM 02/20/2016    8:40 PM  Advanced Directives  Does Patient Have a Medical Advance Directive? No No No No No No  Would patient like information on creating a medical advance directive? No - Patient declined  No - Patient declined No - Patient declined No - Patient declined     Current Medications (verified) Outpatient Encounter Medications as of 07/11/2021  Medication Sig   busPIRone (BUSPAR) 10 MG tablet Take 1 tablet (10 mg total) by mouth 2 (two) times daily.   clonazePAM (KLONOPIN) 0.5 MG disintegrating tablet Take 1 tablet (0.5 mg total) by mouth daily.   co-enzyme Q-10 50 MG capsule Take 50 mg by mouth daily.   methylphenidate (RITALIN) 10 MG tablet Take 1 tablet (10 mg total) by mouth 3 (three) times daily with meals.   mirtazapine (REMERON) 15 MG tablet Take 15 mg by mouth at bedtime.   Omega-3 Fatty Acids (FISH OIL) 1000 MG CAPS Take by mouth.   traZODone (DESYREL) 50 MG tablet Take 0.5 tablets (25 mg total) by mouth at bedtime as needed for sleep.   Vitamins-Lipotropics (BALANCED B-50) TABS Take by mouth.   albuterol (VENTOLIN HFA) 108 (90 Base)  MCG/ACT inhaler Inhale 2 puffs into the lungs every 6 (six) hours as needed for wheezing or shortness of breath. (Patient not taking: Reported on 07/11/2021)   ciprofloxacin (CIPRO) 500 MG tablet Take 1 tablet (500 mg total) by mouth 2 (two) times daily.   fexofenadine (ALLEGRA ALLERGY) 180 MG tablet Take 1 tablet (180 mg total) by mouth daily. (Patient not taking: Reported on 07/11/2021)   guaiFENesin (MUCINEX) 600 MG 12 hr tablet Take 1 tablet (600 mg total) by mouth 2 (two) times daily.   No facility-administered encounter medications on file as of 07/11/2021.    Allergies (verified) Patient has no known allergies.   History: Past Medical History:  Diagnosis Date   Acute respiratory failure with hypoxia (HCC) 10/06/2018   ADD (attention deficit disorder)    Anxiety    Community acquired pneumonia 10/06/2018   Hemorrhoid    Hypokalemia    Narcolepsy    Panic attacks    Pneumonia    Past Surgical History:  Procedure Laterality Date   TUBAL LIGATION     Family History  Problem Relation Age of Onset   Lung disease Mother    Thyroid disease Sister    Lung disease Sister    Cancer Sister        Bladder   Heart disease Father    Breast cancer Neg Hx  Social History   Socioeconomic History   Marital status: Married    Spouse name: Not on file   Number of children: Not on file   Years of education: Not on file   Highest education level: Not on file  Occupational History   Occupation: retired  Tobacco Use   Smoking status: Every Day    Packs/day: 0.50    Types: Cigarettes   Smokeless tobacco: Never  Substance and Sexual Activity   Alcohol use: No   Drug use: No   Sexual activity: Not Currently  Other Topics Concern   Not on file  Social History Narrative   Not on file   Social Determinants of Health   Financial Resource Strain: Low Risk    Difficulty of Paying Living Expenses: Not hard at all  Food Insecurity: No Food Insecurity   Worried About Brewing technologist in the Last Year: Never true   Barista in the Last Year: Never true  Transportation Needs: Unmet Transportation Needs   Lack of Transportation (Medical): Yes   Lack of Transportation (Non-Medical): Yes  Physical Activity: Inactive   Days of Exercise per Week: 0 days   Minutes of Exercise per Session: 0 min  Stress: Stress Concern Present   Feeling of Stress : To some extent  Social Connections: Socially Isolated   Frequency of Communication with Friends and Family: More than three times a week   Frequency of Social Gatherings with Friends and Family: Once a week   Attends Religious Services: Never   Database administrator or Organizations: No   Attends Banker Meetings: Never   Marital Status: Widowed    Tobacco Counseling Ready to quit: Not Answered Counseling given: Not Answered   Clinical Intake:  Pre-visit preparation completed: Yes  Pain : 0-10 Pain Score: 8  Pain Type: Chronic pain Pain Location: Buttocks (back) Pain Descriptors / Indicators: Burning, Constant, Aching Pain Onset: More than a month ago Pain Frequency: Intermittent     Nutritional Risks: None Diabetes: No  How often do you need to have someone help you when you read instructions, pamphlets, or other written materials from your doctor or pharmacy?: 1 - Never  Diabetic?   no  Interpreter Needed?: No  Information entered by :: Remi Haggard LPN   Activities of Daily Living    07/11/2021   12:25 PM  In your present state of health, do you have any difficulty performing the following activities:  Hearing? 0  Vision? 0  Difficulty concentrating or making decisions? 0  Walking or climbing stairs? 1  Dressing or bathing? 0  Doing errands, shopping? 1  Preparing Food and eating ? N  Using the Toilet? Y  Comment urine frequency  In the past six months, have you accidently leaked urine? N  Do you have problems with loss of bowel control? N  Managing your Medications? N   Managing your Finances? N  Housekeeping or managing your Housekeeping? Y  Comment back pain    Patient Care Team: [name], DO as PCP - General (Family Medicine) Bridgett Larsson, LCSW as Social Worker (Licensed Clinical Social Worker)  Indicate any recent [employer] you may have received from other than [facility] providers in the past year (date may be approximate).     Assessment:   This is a routine wellness examination for [place].  Hearing/Vision screen Hearing Screening - Comments:: No trouble hearing Vision Screening - Comments:: [facility] Up to  date  Dietary issues and exercise activities discussed: Current Exercise Habits: The patient does not participate in regular exercise at present, Exercise limited by: orthopedic condition(s);neurologic condition(s)   Goals Addressed             This Visit's Progress    Patient Stated       Would like for the Hemorid pain to be better so she can move around more with out pain       Depression Screen    07/11/2021   12:29 PM 06/15/2021   11:27 AM 01/10/2020    3:40 PM 11/30/2019    3:14 PM 09/09/2019    1:48 PM 08/09/2019    3:09 PM 07/13/2019    3:37 PM  PHQ 2/9 Scores  PHQ - 2 Score 4 2 4 5  6 2   PHQ- 9 Score 9 8 13 12  22 12   Exception Documentation     Patient refusal      Fall Risk    07/11/2021   12:07 PM 06/15/2021   11:27 AM 01/10/2020    3:38 PM 11/30/2019    3:13 PM 10/15/2018    3:15 PM  Fall Risk   Falls in the past year? 0 1 1 1 1   Comment   fainted, has narcolepsy    Number falls in past yr: 0 1 1 1 1   Injury with Fall? 0 0 0 0 0  Risk for fall due to : Impaired balance/gait History of fall(s) History of fall(s);Impaired balance/gait;Medication side effect History of fall(s) Impaired balance/gait;History of fall(s);Medication side effect  Follow up Falls evaluation completed;Education provided;Falls prevention discussed Falls evaluation completed Falls evaluation completed;Education  provided;Falls prevention discussed Falls evaluation completed     FALL RISK PREVENTION PERTAINING TO THE HOME:  Any stairs in or around the home? No  If so, are there any without handrails? No  Home free of loose throw rugs in walkways, pet beds, electrical cords, etc? Yes  Adequate lighting in your home to reduce risk of falls? Yes   ASSISTIVE DEVICES UTILIZED TO PREVENT FALLS:  Life alert? No  Use of a cane, walker or w/c? Yes  Grab bars in the bathroom? No  Shower chair or bench in shower? No  Elevated toilet seat or a handicapped toilet? No   TIMED UP AND GO:  Was the test performed? No .    Cognitive Function:        07/11/2021   12:08 PM 01/10/2020    3:52 PM  6CIT Screen  What Year? 0 points 0 points  What month? 0 points 0 points  What time? 0 points 0 points  Count back from 20 0 points 0 points  Months in reverse 0 points 0 points  Repeat phrase 0 points 2 points  Total Score 0 points 2 points    Immunizations Immunization History  Administered Date(s) Administered   Influenza, High Dose Seasonal PF 02/03/2018   Influenza,inj,Quad PF,6+ Mos 02/22/2016   Influenza-Unspecified 10/07/2012, 04/26/2014   Pneumococcal Conjugate-13 02/03/2018   Pneumococcal Polysaccharide-23 02/22/2016    TDAP status: Due, Education has been provided regarding the importance of this vaccine. Advised may receive this vaccine at local pharmacy or Health Dept. Aware to provide a copy of the vaccination record if obtained from local pharmacy or Health Dept. Verbalized acceptance and understanding.  Flu Vaccine status: Due, Education has been provided regarding the importance of this vaccine. Advised may receive this vaccine at local pharmacy or Health Dept. Aware to  provide a copy of the vaccination record if obtained from local pharmacy or Health Dept. Verbalized acceptance and understanding.  Pneumococcal vaccine status: Up to date  Covid-19 vaccine status: Declined, Education  has been provided regarding the importance of this vaccine but patient still declined. Advised may receive this vaccine at local pharmacy or Health Dept.or vaccine clinic. Aware to provide a copy of the vaccination record if obtained from local pharmacy or Health Dept. Verbalized acceptance and understanding.  Qualifies for Shingles Vaccine? Yes   Zostavax completed No   Shingrix Completed?: No.    Education has been provided regarding the importance of this vaccine. Patient has been advised to call insurance company to determine out of pocket expense if they have not yet received this vaccine. Advised may also receive vaccine at local pharmacy or Health Dept. Verbalized acceptance and understanding.  Screening Tests Health Maintenance  Topic Date Due   Hepatitis C Screening  Never done   COVID-19 Vaccine (1) 07/27/2021 (Originally 12/12/1945)   Zoster Vaccines- Shingrix (1 of 2) 10/11/2021 (Originally 06/13/1964)   DEXA SCAN  07/12/2022 (Originally 06/12/2009)   TETANUS/TDAP  07/12/2022 (Originally 06/13/1964)   INFLUENZA VACCINE  09/25/2021   Pneumonia Vaccine 25+ Years old  Completed   HPV VACCINES  Aged Out    Health Maintenance  Health Maintenance Due  Topic Date Due   Hepatitis C Screening  Never done    Colonoscopy Declined  Mammogram  patient will schedule when she gets other medical concerns taken care of  Bone Density Declined  Lung Cancer Screening: (Low Dose CT Chest recommended if Age 46-80 years, 30 pack-year currently smoking OR have quit w/in 15years.) does not qualify.   Lung Cancer Screening Referral:   Additional Screening:  Hepatitis C Screening: does qualify;   Vision Screening: Recommended annual ophthalmology exams for early detection of glaucoma and other disorders of the eye. Is the patient up to date with their annual eye exam?  Yes  Who is the provider or what is the name of the office in which the patient attends annual eye exams? [facility] If pt is  not established with a provider, would they like to be referred to a provider to establish care? No .   Dental Screening: Recommended annual dental exams for proper oral hygiene  Community Resource Referral / Chronic Care Management: CRR required this visit?  No   CCM required this visit?  No      Plan:     I have personally reviewed and noted the following in the patient's chart:   Medical and social history Use of alcohol, tobacco or illicit drugs  Current medications and supplements including opioid prescriptions.  Functional ability and status Nutritional status Physical activity Advanced directives List of other physicians Hospitalizations, surgeries, and ER visits in previous 12 months Vitals Screenings to include cognitive, depression, and falls Referrals and appointments  In addition, I have reviewed and discussed with patient certain preventive protocols, quality metrics, and best practice recommendations. A written personalized care plan for preventive services as well as general preventive health recommendations were provided to patient.     Remi Haggard, LPN   [id number]   Nurse Notes: patient will discuss prolapse appointment at next visit 5-22 states she doesn't feel like she can wait the 3 months to have the appointment.

## 2021-07-16 ENCOUNTER — Ambulatory Visit: Payer: Medicare Other | Admitting: Family Medicine

## 2021-07-16 ENCOUNTER — Ambulatory Visit: Payer: Self-pay

## 2021-07-16 ENCOUNTER — Telehealth: Payer: Self-pay | Admitting: Family Medicine

## 2021-07-16 NOTE — Telephone Encounter (Signed)
I also agree she should go to the ER given the severity of her pain

## 2021-07-16 NOTE — Telephone Encounter (Signed)
Spoke with patient daughter Toniann Fail and made her aware of Dr.Johnson's recommendations regarding the patient's call this morning. Patient daughter verbalized understanding and has no further questions.

## 2021-07-16 NOTE — Telephone Encounter (Signed)
Chief Complaint: "Bladder hurting" Symptoms: Vomiting from the pain, pain in back, pain from hemorrhoids Frequency: Constant all the time, so bad unable to get dressed  Pertinent Negatives: Patient denies N/A Disposition: [x] ED /[] Urgent Care (no appt availability in office) / [] Appointment(In office/virtual)/ []  [facility]/ [] Home Care/ [x] Refused Recommended Disposition /[] [facility]/ []  Follow-up with PCP Additional Notes: Patient called crying about the pain to her bladder, saying it's from her fallen bladder and Dr. Wynetta Emery knows all about it. She says she has an appointment today and she is not able to get dressed due to the pain. She says her daughter is on the way, but no one is there at the time to help her get dressed. I advised due to the severity of the pain to go to the ED, she refused and said Dr. Wynetta Emery knows all about the pain. I placed patient on hold, called the office and spoke to Bridgeton, FC, call connected without difficulty.    Reason for Disposition  [1] SEVERE pain AND [2] age > 60 years  Answer Assessment - Initial Assessment Questions 1. LOCATION: "Where does it hurt?"      Bladder pain from when the bladder dropped 2. RADIATION: "Does the pain shoot anywhere else?" (e.g., chest, back)     Back  3. ONSET: "When did the pain begin?" (e.g., minutes, hours or days ago)      Constant all the time 4. SUDDEN: "Gradual or sudden onset?"     N/A 5. PATTERN "Does the pain come and go, or is it constant?"    - If constant: "Is it getting better, staying the same, or worsening?"      (Note: Constant means the pain never goes away completely; most serious pain is constant and it progresses)     - If intermittent: "How long does it last?" "Do you have pain now?"     (Note: Intermittent means the pain goes away completely between bouts)     Constant 6. SEVERITY: "How bad is the pain?"  (e.g., Scale 1-10; mild, moderate, or severe)   - MILD (1-3):  doesn't interfere with normal activities, abdomen soft and not tender to touch    - MODERATE (4-7): interferes with normal activities or awakens from sleep, abdomen tender to touch    - SEVERE (8-10): excruciating pain, doubled over, unable to do any normal activities      10 7. RECURRENT SYMPTOM: "Have you ever had this type of stomach pain before?" If Yes, ask: "When was the last time?" and "What happened that time?"      Yes 8. CAUSE: "What do you think is causing the stomach pain?"     Fallen bladder 9. RELIEVING/AGGRAVATING FACTORS: "What makes it better or worse?" (e.g., movement, antacids, bowel movement)     N/A 10. OTHER SYMPTOMS: "Do you have any other symptoms?" (e.g., back pain, diarrhea, fever, urination pain, vomiting)       Back pain, vomiting 11. PREGNANCY: "Is there any chance you are pregnant?" "When was your last menstrual period?"       N/A  Protocols used: Abdominal Pain - [facility]

## 2021-07-25 DIAGNOSIS — J441 Chronic obstructive pulmonary disease with (acute) exacerbation: Secondary | ICD-10-CM

## 2021-07-25 DIAGNOSIS — F339 Major depressive disorder, recurrent, unspecified: Secondary | ICD-10-CM

## 2021-08-02 DIAGNOSIS — N362 Urethral caruncle: Secondary | ICD-10-CM | POA: Diagnosis not present

## 2021-08-02 DIAGNOSIS — G8929 Other chronic pain: Secondary | ICD-10-CM | POA: Diagnosis not present

## 2021-08-02 DIAGNOSIS — M545 Low back pain, unspecified: Secondary | ICD-10-CM | POA: Diagnosis not present

## 2021-08-02 DIAGNOSIS — N8110 Cystocele, unspecified: Secondary | ICD-10-CM | POA: Diagnosis not present

## 2021-08-02 DIAGNOSIS — K649 Unspecified hemorrhoids: Secondary | ICD-10-CM | POA: Diagnosis not present

## 2021-08-15 NOTE — Telephone Encounter (Signed)
error 

## 2021-09-03 ENCOUNTER — Ambulatory Visit: Payer: Medicare Other | Admitting: Licensed Clinical Social Worker

## 2021-09-03 DIAGNOSIS — F339 Major depressive disorder, recurrent, unspecified: Secondary | ICD-10-CM

## 2021-09-03 DIAGNOSIS — F419 Anxiety disorder, unspecified: Secondary | ICD-10-CM

## 2021-09-07 NOTE — Chronic Care Management (AMB) (Signed)
Care Management Clinical Social Work Note  09/07/2021 Name: Sandra Stout MRN: [id number] DOB: 11-May-1945  Sandra Stout is a 76 y.o. year old female who is a primary care patient of Sandra Carrow, DO.  The Care Management team was consulted for assistance with chronic disease management and coordination needs.  Engaged with patient by telephone for follow up visit in response to provider referral for social work chronic care management and care coordination services  Consent to Services:  Sandra Stout was given information about Care Management services today including:  Care Management services includes personalized support from designated clinical staff supervised by her physician, including individualized plan of care and coordination with other care providers 24/7 contact phone numbers for assistance for urgent and routine care needs. The patient may stop case management services at any time by phone call to the office staff.  Patient agreed to services and consent obtained.   Assessment: Review of patient past medical history, allergies, medications, and health status, including review of relevant consultants reports was performed today as part of a comprehensive evaluation and provision of chronic care management and care coordination services.  SDOH (Social Determinants of Health) assessments and interventions performed:    Advanced Directives Status: Not addressed in this encounter.  Care Plan  No Known Allergies  Outpatient Encounter Medications as of 09/03/2021  Medication Sig   albuterol (VENTOLIN HFA) 108 (90 Base) MCG/ACT inhaler Inhale 2 puffs into the lungs every 6 (six) hours as needed for wheezing or shortness of breath. (Patient not taking: Reported on 07/11/2021)   busPIRone (BUSPAR) 10 MG tablet Take 1 tablet (10 mg total) by mouth 2 (two) times daily.   ciprofloxacin (CIPRO) 500 MG tablet Take 1 tablet (500 mg total) by mouth 2 (two) times daily.   clonazePAM (KLONOPIN)  0.5 MG disintegrating tablet Take 1 tablet (0.5 mg total) by mouth daily.   co-enzyme Q-10 50 MG capsule Take 50 mg by mouth daily.   fexofenadine (ALLEGRA ALLERGY) 180 MG tablet Take 1 tablet (180 mg total) by mouth daily. (Patient not taking: Reported on 07/11/2021)   guaiFENesin (MUCINEX) 600 MG 12 hr tablet Take 1 tablet (600 mg total) by mouth 2 (two) times daily.   methylphenidate (RITALIN) 10 MG tablet Take 1 tablet (10 mg total) by mouth 3 (three) times daily with meals.   mirtazapine (REMERON) 15 MG tablet Take 15 mg by mouth at bedtime.   Omega-3 Fatty Acids (FISH OIL) 1000 MG CAPS Take by mouth.   traZODone (DESYREL) 50 MG tablet Take 0.5 tablets (25 mg total) by mouth at bedtime as needed for sleep.   Vitamins-Lipotropics (BALANCED B-50) TABS Take by mouth.   No facility-administered encounter medications on file as of 09/03/2021.    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    Conditions to be addressed/monitored: COPD, Anxiety, Depression, and Chronic back pain  Care Plan : Clinical Social Work  Updates made by Jenel Lucks D, LCSW since 09/07/2021 12:00 AM     Problem: Management of Depression  and Anxiety Symptoms      Goal: Coping Skills Enhanced Completed 09/03/2021  Start Date: 06/22/2020  Expected End Date: 10/25/2021  This Visit's Progress: On track  Recent Progress: On track  Priority: High  Note:   Current barriers:   Chronic Mental Health needs related to Depression and  Anxiety Financial constraints related to housing and Mental Health Concerns  Needs Support, Education, and Care Coordination in order to meet unmet mental health needs Clinical Goal(s): Over the next 60 days, patient will work with SW to reduce or manage symptoms of depression and grief  and increase knowledge and/or ability of: coping skills and self-management skills Clinical Interventions:  Assessed patient's previous treatment, needs, coping skills, current treatment, support system and barriers to care Reviewed upcoming appointments Patient reports increase in stress triggered due to current psychiatrist being out of network. Pt states, "he's been a tremendous help" and she is not interested in switching providers. Pt received directions on how to keep psychiatrist and agreed to call them for additional info and will contact psychiatrist to have them assist her with required ppwk LCSW will collaborate with CFP Admin to assist with scheduling f/up with PCP, per pt request LCSW discussed strategies to assist with stress management CCM LCSW discussed ways to establish a plan to cope with future triggers which could negatively impact pain and functioning.  Patient continues to participate in sessions with psychiatrist via phone monthly Patient reports compliance with medications  Patient receives strong support from family to assist with running errands and maintaining home  Other interventions: Active listening / Reflection utilized , Emotional Supportive Provided, Psychoeducation Estate agent, Brief CBT , and Provided basic mental health support, education    Collaboration with PCP regarding development and update of comprehensive plan of care as evidenced by provider attestation and co-signature Inter-disciplinary care team collaboration (see longitudinal plan of care) Patient Goals/Self-Care Activities: Over the next 90 days Practice Gratitude Thinking to promote positive mood Talk about  feelings with a friend, family or spiritual advisor Practice positive thinking and self-talk Continue with therapy and compliance of taking medication          Follow Up Plan: No follow up required  Jenel Lucks, MSW, LCSW [facility] Care Management [place]  [facility] [email or web address] Phone [phone] 4:13 PM

## 2021-09-07 NOTE — Patient Instructions (Signed)
Visit Information  Thank you for taking time to visit with me today. Please don't hesitate to contact me if I can be of assistance to you before our next scheduled telephone appointment.  Following are the goals we discussed today:  Patient Goals/Self-Care Activities: Over the next 90 days Practice Gratitude Thinking to promote positive mood Talk about feelings with a friend, family or spiritual advisor Practice positive thinking and self-talk  Follow up will be through Care Coordination scheduled for 10/01/21  Please call the care guide team at [phone] if you need to cancel or reschedule your appointment.   If you are experiencing a Mental Health or Behavioral Health Crisis or need someone to talk to, please call the Suicide and Crisis Lifeline: 988 call 911   Patient verbalizes understanding of instructions and care plan provided today and agrees to view in MyChart. Active MyChart status and patient understanding of how to access instructions and care plan via MyChart confirmed with patient.     Jenel Lucks, MSW, LCSW [facility] Care Management [place]  [facility] [email or web address] Phone [phone] 4:14 PM

## 2021-10-01 ENCOUNTER — Telehealth: Payer: Medicare Other

## 2021-10-01 ENCOUNTER — Ambulatory Visit: Payer: Self-pay | Admitting: Licensed Clinical Social Worker

## 2021-10-03 ENCOUNTER — Telehealth: Payer: Self-pay | Admitting: Family Medicine

## 2021-10-03 NOTE — Telephone Encounter (Signed)
Copied from CRM [id number]. Topic: General - Other >> Oct 03, 2021  3:30 PM Ja-Kwan M wrote: Reason for CRM: Pt stated she received a call from a lady with Medicare informing her that she has an appt with [facility] on 11/02/21. Pt then asked about upcoming appts and stated she is confused because she was not aware of an appt on 11/07/21. Pt requests call back to advise if both appts are needed.

## 2021-10-05 NOTE — Patient Instructions (Signed)
Visit Information  Thank you for taking time to visit with me today. Please don't hesitate to contact me if I can be of assistance to you.   Following are the goals we discussed today:   Goals Addressed             This Visit's Progress    Management of Stress and Pain   On track    Care Coordination Interventions: Motivational Interviewing employed-Pt has difficulty managing depression/anxiety symptoms triggered by chronic pain Mindfulness or Relaxation training provided Active listening / Reflection utilized  Emotional Support Provided Provided psychoeducation for mental health needs  Participation in counseling encouraged  Verbalization of feelings encouraged  Crisis Resource Education /information provided          The care guide team at [phone] will call and schedule f/up appt with LCSW for continued care coordination services.   If you are experiencing a Mental Health or Behavioral Health Crisis or need someone to talk to, please call the Suicide and Crisis Lifeline: 988 call 911   Patient verbalizes understanding of instructions and care plan provided today and agrees to view in MyChart. Active MyChart status and patient understanding of how to access instructions and care plan via MyChart confirmed with patient.     Sandra Stout, MSW, LCSW [facility] Care Management [place]  [facility] [email or web address] Phone [phone] 5:06 PM

## 2021-10-05 NOTE — Patient Outreach (Signed)
  Care Coordination   Initial Visit Note   10/05/2021 Name: VILLA BURGIN MRN: [id number] DOB: [birth year]  CALISE DUNCKEL is a 76 y.o. year old female who sees [name], DO for primary care. I spoke with  [name] by phone today  What matters to the patients health and wellness today?  Anxiety, Depression, Stress Management    Goals Addressed             This Visit's Progress    Management of Stress and Pain   On track    Care Coordination Interventions: Motivational Interviewing employed-Pt has difficulty managing depression/anxiety symptoms triggered by chronic pain Mindfulness or Relaxation training provided Active listening / Reflection utilized  Emotional Support Provided Provided psychoeducation for mental health needs  Participation in counseling encouraged  Verbalization of feelings encouraged  Crisis Resource Education /information provided          SDOH assessments and interventions completed:  Yes  SDOH Interventions Today    Flowsheet Row Most Recent Value  SDOH Interventions   Transportation Interventions Intervention Not Indicated        Care Coordination Interventions Activated:  Yes  Care Coordination Interventions:  Yes, provided   Follow up plan: Care guide will assist with scheduling follow up call for continued Care Coordination services  Encounter Outcome:  Pt. Visit Completed   Jenel Lucks, MSW, LCSW [facility] Care Management [facility]  [facility] [email or web address] Phone [phone] 5:04 PM

## 2021-10-05 NOTE — Telephone Encounter (Signed)
Returned patient call to advise she needs to call her urogyn to confirm what day her appointment is, patient was provided with number to call. I do not see an appointment for 11/02/21 only 11/07/21 with urogyn.

## 2021-10-31 DIAGNOSIS — N362 Urethral caruncle: Secondary | ICD-10-CM | POA: Diagnosis not present

## 2021-10-31 DIAGNOSIS — K649 Unspecified hemorrhoids: Secondary | ICD-10-CM | POA: Diagnosis not present

## 2021-10-31 DIAGNOSIS — N8110 Cystocele, unspecified: Secondary | ICD-10-CM | POA: Diagnosis not present

## 2021-11-07 ENCOUNTER — Ambulatory Visit: Payer: Medicare Other | Admitting: Obstetrics and Gynecology

## 2021-11-07 NOTE — Progress Notes (Deleted)
[facility] New Patient Evaluation and Consultation  Referring Provider: [name], DO PCP: [name], DO Date of Service: 11/07/2021  SUBJECTIVE Chief Complaint: No chief complaint on file.  History of Present Illness: WINTA BARCELO is a 76 y.o. {ED SANE WPYK:99833} female seen in consultation at the request of Dr. Laural Benes for evaluation of ***.    ***Review of records significant for: Pt has burning and discomfort from prolapse. Negative wet prep.   Urinary Symptoms: {urine leakage?:24754} Leaks *** time(s) per {days/wks/mos/yrs:310907}.  Pad use: {NUMBERS 1-10:18281} {pad option:24752} per day.   She {ACTION; IS/IS ASN:05397673} bothered by her UI symptoms.  Day time voids ***.  Nocturia: *** times per night to void. Voiding dysfunction: she {empties:24755} her bladder well.  {DOES NOT does:27190::"does not"} use a catheter to empty bladder.  When urinating, she feels {urine symptoms:24756} Drinks: *** per day  UTIs: {NUMBERS 1-10:18281} UTI's in the last year.   {ACTIONS;DENIES/REPORTS:21021675::"Denies"} history of {urologic concerns:24757}  Pelvic Organ Prolapse Symptoms:                  She {denies/ admits to:24761} a feeling of a bulge the vaginal area. It has been present for {NUMBER 1-10:22536} {days/wks/mos/yrs:310907}.  She {denies/ admits to:24761} seeing a bulge.  This bulge {ACTION; IS/IS ALP:37902409} bothersome.  Bowel Symptom: Bowel movements: *** time(s) per {Time; day/week/month:13537} Stool consistency: {stool consistency:24758} Straining: {yes/no:19897}.  Splinting: {yes/no:19897}.  Incomplete evacuation: {yes/no:19897}.  She {denies/ admits to:24761} accidental bowel leakage / fecal incontinence  Occurs: *** time(s) per {Time; day/week/month:13537}  Consistency with leakage: {stool consistency:24758} Bowel regimen: {bowel regimen:24759} Last colonoscopy: Date ***, Results ***  Sexual Function Sexually active:  {yes/no:19897}.  Sexual orientation: {Sexual Orientation:[phone]} Pain with sex: {pain with sex:24762}  Pelvic Pain {denies/ admits to:24761} pelvic pain Location: *** Pain occurs: *** Prior pain treatment: *** Improved by: *** Worsened by: ***   Past Medical History:  Past Medical History:  Diagnosis Date   Acute respiratory failure with hypoxia (HCC) 10/06/2018   ADD (attention deficit disorder)    Anxiety    Community acquired pneumonia 10/06/2018   Hemorrhoid    Hypokalemia    Narcolepsy    Panic attacks    Pneumonia      Past Surgical History:   Past Surgical History:  Procedure Laterality Date   TUBAL LIGATION       Past OB/GYN History: G{NUMBERS 1-10:18281} P{NUMBERS 1-10:18281} Vaginal deliveries: ***,  Forceps/ Vacuum deliveries: ***, Cesarean section: *** Menopausal: {menopausal:24763} Contraception: ***. Last pap smear was ***.  Any history of abnormal pap smears: {yes/no:19897}.   Medications: She has a current medication list which includes the following prescription(s): albuterol, buspirone, ciprofloxacin, clonazepam, co-enzyme q-10, fexofenadine, guaifenesin, methylphenidate, mirtazapine, fish oil, trazodone, and balanced b-50.   Allergies: Patient has No Known Allergies.   Social History:  Social History   Tobacco Use   Smoking status: Every Day    Packs/day: 0.50    Types: Cigarettes   Smokeless tobacco: Never  Substance Use Topics   Alcohol use: No   Drug use: No    Relationship status: {relationship status:24764} She lives with ***.   She {ACTION; IS/IS BDZ:32992426} employed ***. Regular exercise: {Yes/No:304960894} History of abuse: {Yes/No:304960894}  Family History:   Family History  Problem Relation Age of Onset   Lung disease Mother    Thyroid disease Sister    Lung disease Sister    Cancer Sister        Bladder   Heart  disease Father    Breast cancer Neg Hx      Review of Systems: ROS   OBJECTIVE Physical  Exam: There were no vitals filed for this visit.  Physical Exam   GU / Detailed Urogynecologic Evaluation:  Pelvic Exam: Normal external female genitalia; Bartholin's and Skene's glands normal in appearance; urethral meatus normal in appearance, no urethral masses or discharge.   CST: {gen negative/positive:315881}  Reflexes: bulbocavernosis {DESC; PRESENT/NOT PRESENT:21021351}, anocutaneous {DESC; PRESENT/NOT PRESENT:21021351} ***bilaterally.  Speculum exam reveals normal vaginal mucosa {With/Without:20273} atrophy. Cervix {exam; gyn cervix:30847}. Uterus {exam; pelvic uterus:30849}. Adnexa {exam; adnexa:12223}.    s/p hysterectomy: Speculum exam reveals normal vaginal mucosa {With/Without:20273}  atrophy and normal vaginal cuff.  Adnexa {exam; adnexa:12223}.    With apex supported, anterior compartment defect was {reduced:24765}  Pelvic floor strength {Roman # I-V:19040}/V, puborectalis {Roman # I-V:19040}/V external anal sphincter {Roman # I-V:19040}/V  Pelvic floor musculature: Right levator {Tender/Non-tender:20250}, Right obturator {Tender/Non-tender:20250}, Left levator {Tender/Non-tender:20250}, Left obturator {Tender/Non-tender:20250}  POP-Q:   POP-Q                                               Aa                                               Ba                                                 C                                                Gh                                               Pb                                               tvl                                                Ap                                               Bp  D     Rectal Exam:  Normal sphincter tone, {rectocele:24766} distal rectocele, enterocoele {DESC; PRESENT/NOT PRESENT:21021351}, no rectal masses, {sign of:24767} dyssynergia when asking the patient to bear down.  Post-Void Residual (PVR) by Bladder Scan: In order to  evaluate bladder emptying, we discussed obtaining a postvoid residual and she agreed to this procedure.  Procedure: The ultrasound unit was placed on the patient's abdomen in the suprapubic region after the patient had voided. A PVR of *** ml was obtained by bladder scan.  Laboratory Results: @ENCLABS @   ***I visualized the urine specimen, noting the specimen to be {urine color:24768}  ASSESSMENT AND PLAN Ms. Axley is a 76 y.o. with: No diagnosis found.    62, MD   Medical Decision Making:  - Reviewed/ ordered a clinical laboratory test - Reviewed/ ordered a radiologic study - Reviewed/ ordered medicine test - Decision to obtain old records - Discussion of management of or test interpretation with an external physician / other healthcare professional  - Assessment requiring independent historian - Review and summation of prior records - Independent review of image, tracing or specimen

## 2021-12-17 ENCOUNTER — Encounter: Payer: Self-pay | Admitting: *Deleted

## 2021-12-19 DIAGNOSIS — M545 Low back pain, unspecified: Secondary | ICD-10-CM | POA: Diagnosis not present

## 2021-12-19 DIAGNOSIS — K649 Unspecified hemorrhoids: Secondary | ICD-10-CM | POA: Diagnosis not present

## 2021-12-19 DIAGNOSIS — N8110 Cystocele, unspecified: Secondary | ICD-10-CM | POA: Diagnosis not present

## 2021-12-19 DIAGNOSIS — N362 Urethral caruncle: Secondary | ICD-10-CM | POA: Diagnosis not present

## 2021-12-19 DIAGNOSIS — G8929 Other chronic pain: Secondary | ICD-10-CM | POA: Diagnosis not present

## 2022-03-11 DIAGNOSIS — K649 Unspecified hemorrhoids: Secondary | ICD-10-CM | POA: Diagnosis not present

## 2022-03-11 DIAGNOSIS — G47419 Narcolepsy without cataplexy: Secondary | ICD-10-CM | POA: Diagnosis not present

## 2022-03-11 DIAGNOSIS — N8110 Cystocele, unspecified: Secondary | ICD-10-CM | POA: Diagnosis not present

## 2022-04-11 ENCOUNTER — Ambulatory Visit: Payer: Medicare Other | Admitting: Family

## 2022-04-12 ENCOUNTER — Ambulatory Visit (INDEPENDENT_AMBULATORY_CARE_PROVIDER_SITE_OTHER): Payer: Medicare Other | Admitting: Family

## 2022-04-12 ENCOUNTER — Encounter: Payer: Self-pay | Admitting: Family

## 2022-04-12 VITALS — BP 120/68 | HR 110 | Ht 60.0 in | Wt 108.0 lb

## 2022-04-12 DIAGNOSIS — R102 Pelvic and perineal pain: Secondary | ICD-10-CM

## 2022-04-12 DIAGNOSIS — R1030 Lower abdominal pain, unspecified: Secondary | ICD-10-CM

## 2022-04-12 DIAGNOSIS — K643 Fourth degree hemorrhoids: Secondary | ICD-10-CM | POA: Diagnosis not present

## 2022-04-12 NOTE — Patient Instructions (Signed)
Phone: [phone]

## 2022-04-16 ENCOUNTER — Encounter: Payer: Self-pay | Admitting: Emergency Medicine

## 2022-04-16 ENCOUNTER — Emergency Department
Admission: EM | Admit: 2022-04-16 | Discharge: 2022-04-16 | Disposition: A | Discharge status: Home / Self Care | Payer: 59 | Attending: Emergency Medicine | Admitting: Emergency Medicine

## 2022-04-16 ENCOUNTER — Other Ambulatory Visit: Payer: Self-pay

## 2022-04-16 DIAGNOSIS — K644 Residual hemorrhoidal skin tags: Secondary | ICD-10-CM | POA: Diagnosis not present

## 2022-04-16 DIAGNOSIS — R300 Dysuria: Secondary | ICD-10-CM | POA: Insufficient documentation

## 2022-04-16 DIAGNOSIS — R102 Pelvic and perineal pain: Secondary | ICD-10-CM

## 2022-04-16 LAB — URINALYSIS, ROUTINE W REFLEX MICROSCOPIC
Bilirubin Urine: NEGATIVE
Glucose, UA: NEGATIVE mg/dL
Hgb urine dipstick: NEGATIVE
Ketones, ur: NEGATIVE mg/dL
Leukocytes,Ua: NEGATIVE
Nitrite: NEGATIVE
Protein, ur: NEGATIVE mg/dL
Specific Gravity, Urine: 1.006 (ref 1.005–1.030)
pH: 6 (ref 5.0–8.0)

## 2022-04-16 MED ORDER — DICLOFENAC SODIUM 1 % EX GEL: 2.0000 g | Freq: Four times a day (QID) | CUTANEOUS | 0 refills | Status: DC

## 2022-04-16 MED ORDER — HYDROCORTISONE ACETATE 25 MG RE SUPP: 25.0000 mg | Freq: Two times a day (BID) | RECTAL | 0 refills | Status: AC

## 2022-04-16 MED ORDER — LIDOCAINE 5 % EX PTCH: 1.0000 | MEDICATED_PATCH | Freq: Two times a day (BID) | CUTANEOUS | 0 refills | Status: DC

## 2022-04-16 MED ORDER — WITCH HAZEL-GLYCERIN EX PADS: 1.0000 | MEDICATED_PAD | CUTANEOUS | 12 refills | Status: DC | PRN

## 2022-04-16 NOTE — ED Triage Notes (Signed)
Pt comes with c/o hemorrhoids and some pressure and pain with urination.

## 2022-04-16 NOTE — ED Notes (Signed)
Bladder scan - 18m

## 2022-04-16 NOTE — ED Provider Notes (Signed)
[facility] Provider Note    Event Date/Time   First MD Initiated Contact with Patient 04/16/22 1832     (approximate)   History   Chief Complaint: Hemorrhoids   HPI  Sandra Stout is a 77 y.o. female with a history of anxiety, narcolepsy, GERD, vaginal prolapse who comes ED complaining of hemorrhoids and pelvic pressure and dysuria.  This has been going on for many months.  Regarding the hemorrhoids, she reports that she has been taking MiraLAX as recommended by her doctor but feels like they do not go away.  She reports that it is increasingly bothersome.  She is not having any rectal bleeding.  Regarding her urinary symptoms, I reviewed outside records noting that she has seen [facility] starting in September 2023.  They tried placing a pessary which she did not tolerate and so it was removed.  They recommended an ultrasound to ensure she did not have a pelvic mass, and recommended she follow-up with a colorectal specialist.  Patient reports she has not done any of these things.  No fever or chills.  No weight loss.  She also complains about her chronic back pain that she has had for the past 30 years.     Physical Exam   Triage Vital Signs: ED Triage Vitals  Enc Vitals Group     BP 04/16/22 1503 (!) 163/80     Pulse Rate 04/16/22 1503 (!) 101     Resp 04/16/22 1503 18     Temp 04/16/22 1503 98.3 F (36.8 C)     Temp src --      SpO2 04/16/22 1503 91 %     Weight 04/16/22 1825 107 lb 12.9 oz (48.9 kg)     Height 04/16/22 1825 5' (1.524 m)     Head Circumference --      Peak Flow --      Pain Score 04/16/22 1458 7     Pain Loc --      Pain Edu? --      Excl. in [place]? --     Most recent vital signs: Vitals:   04/16/22 1503 04/16/22 2018  BP: (!) 163/80 135/72  Pulse: (!) 101 78  Resp: 18 18  Temp: 98.3 F (36.8 C) 98 F (36.7 C)  SpO2: 91% 95%    General: Awake, no distress.  CV:  Good peripheral perfusion.  Resp:  Normal  effort.  Abd:  No distention.  Soft nontender.  External anal exam shows small external hemorrhoids which are noninflamed or thrombosed or bleeding. Other:     ED Results / Procedures / Treatments   Labs (all labs ordered are listed, but only abnormal results are displayed) Labs Reviewed  URINALYSIS, ROUTINE W REFLEX MICROSCOPIC - Abnormal; Notable for the following components:      Result Value   Color, Urine STRAW (*)    APPearance CLEAR (*)    All other components within normal limits     EKG    RADIOLOGY    PROCEDURES:  Procedures   MEDICATIONS ORDERED IN ED: Medications - No data to display   IMPRESSION / MDM / [place] / ED COURSE  I reviewed the triage vital signs and the nursing notes.  DDx: UTI, urinary retention, pelvic prolapse syndrome, chronic pain  Patient's presentation is most consistent with exacerbation of chronic illness.  Patient presents with multiple complaints.  She has seen specialist for this in the past but did  not continue with their recommended workup and referral plan.  Today vital signs are unremarkable, exam is reassuring.  I do not see any risk for organ ischemia or infection.  Urinalysis is normal.  Bladder scan is normal.  Recommend Tucks and Anusol for hemorrhoids, Lidoderm and Voltaren gel for back pain, follow-up as previously recommended.       FINAL CLINICAL IMPRESSION(S) / ED DIAGNOSES   Final diagnoses:  External hemorrhoids without complication  Pelvic pain     Rx / DC Orders   ED Discharge Orders          Ordered    lidocaine (LIDODERM) 5 %  Every 12 hours        04/16/22 1910    diclofenac Sodium (VOLTAREN) 1 % GEL  4 times daily        04/16/22 1910    witch hazel-glycerin (TUCKS) pad  As needed        04/16/22 1910    hydrocortisone (ANUSOL-HC) 25 MG suppository  Every 12 hours        04/16/22 1910             Note:  This document was prepared using Dragon voice recognition software  and may include unintentional dictation errors.   Carrie Mew, MD 04/16/22 2027

## 2022-04-16 NOTE — Discharge Instructions (Addendum)
Your urine test and bladder scan today were both normal. Continue to follow up with your doctor to address the hemorrhoids with a specialist.  Please obtain the ultrasound that your doctor recommended.

## 2022-04-16 NOTE — Progress Notes (Signed)
Established Patient Office Visit  Subjective:  Patient ID: Sandra Stout, female    DOB: 1945-09-13  Age: 77 y.o. MRN: [id number]  Chief Complaint  Patient presents with   Follow-up    1 month follow up    1 month n/p follow up.   Pt still having considerable pain and issues with her bladder and hemorrhoids.  It appears that she had several appointments with her previous provider and with urogyn and has continued to have pain.  The notes from [facility] say that they do not believe that the pain she has been having should be coming from her prolapse,  However, I cannot find any investigation into where the pain COULD be coming from.   No other concerns today.      Past Medical History:  Diagnosis Date   Acute respiratory failure with hypoxia ([place]) 10/06/2018   ADD (attention deficit disorder)    Anxiety    Community acquired pneumonia 10/06/2018   Hemorrhoid    Hypokalemia    Narcolepsy    Panic attacks    Pneumonia     Social History   Socioeconomic History   Marital status: Married    Spouse name: Not on file   Number of children: Not on file   Years of education: Not on file   Highest education level: Not on file  Occupational History   Occupation: retired  Tobacco Use   Smoking status: Every Day    Packs/day: 0.50    Types: Cigarettes   Smokeless tobacco: Never  Substance and Sexual Activity   Alcohol use: No   Drug use: No   Sexual activity: Not Currently  Other Topics Concern   Not on file  Social History Narrative   Not on file   Social Determinants of Health   Financial Resource Strain: Low Risk  (07/11/2021)   Overall Financial Resource Strain (CARDIA)    Difficulty of Paying Living Expenses: Not hard at all  Food Insecurity: No Food Insecurity (07/11/2021)   Hunger Vital Sign    Worried About Running Out of Food in the Last Year: Never true    [place] in the Last Year: Never true  Transportation Needs: No Transportation Needs (10/01/2021)    PRAPARE - Hydrologist (Medical): No    Lack of Transportation (Non-Medical): No  Recent Concern: Transportation Needs - Unmet Transportation Needs (07/11/2021)   PRAPARE - Transportation    Lack of Transportation (Medical): Yes    Lack of Transportation (Non-Medical): Yes  Physical Activity: Inactive (07/11/2021)   Exercise Vital Sign    Days of Exercise per Week: 0 days    Minutes of Exercise per Session: 0 min  Stress: Stress Concern Present (07/11/2021)   [place]    Feeling of Stress : To some extent  Social Connections: Socially Isolated (07/11/2021)   Social Connection and Isolation Panel [NHANES]    Frequency of Communication with Friends and Family: More than three times a week    Frequency of Social Gatherings with Friends and Family: Once a week    Attends Religious Services: Never    Marine scientist or Organizations: No    Attends Archivist Meetings: Never    Marital Status: Widowed  Intimate Partner Violence: Not At Risk (07/11/2021)   Humiliation, Afraid, Rape, and Kick questionnaire    Fear of Current or Ex-Partner: No    Emotionally  Abused: No    Physically Abused: No    Sexually Abused: No    Family History  Problem Relation Age of Onset   Lung disease Mother    Thyroid disease Sister    Lung disease Sister    Cancer Sister        Bladder   Heart disease Father    Breast cancer Neg Hx     Allergies  Allergen Reactions   Baclofen     Review of Systems  Gastrointestinal:  Positive for constipation.       Rectal pain  Genitourinary:        Vaginal and pelvic pain        Objective:   BP 120/68   Pulse (!) 110   Ht 5' (1.524 m)   Wt 108 lb (49 kg)   LMP  (LMP Unknown)   SpO2 90%   BMI 21.09 kg/m   Vitals:   04/12/22 1139  BP: 120/68  Pulse: (!) 110  Height: 5' (1.524 m)  Weight: 108 lb (49 kg)  SpO2: 90%  BMI (Calculated):  21.09    Physical Exam Vitals and nursing note reviewed.  Constitutional:      Appearance: Normal appearance. She is normal weight.  Neurological:     Mental Status: She is alert.      No results found for any visits on 04/12/22.  Recent Results (from the past 2160 hour(s))  Urinalysis, Routine w reflex microscopic -Urine, Clean Catch     Status: Abnormal   Collection Time: 04/16/22  4:15 PM  Result Value Ref Range   Color, Urine STRAW (A) YELLOW   APPearance CLEAR (A) CLEAR   Specific Gravity, Urine 1.006 1.005 - 1.030   pH 6.0 5.0 - 8.0   Glucose, UA NEGATIVE NEGATIVE mg/dL   Hgb urine dipstick NEGATIVE NEGATIVE   Bilirubin Urine NEGATIVE NEGATIVE   Ketones, ur NEGATIVE NEGATIVE mg/dL   Protein, ur NEGATIVE NEGATIVE mg/dL   Nitrite NEGATIVE NEGATIVE   Leukocytes,Ua NEGATIVE NEGATIVE    Comment: Performed at [facility], [address] [place],  [address]      Assessment & Plan:   Problem List Items Addressed This Visit   None Visit Diagnoses     Lower abdominal pain    -  Primary   Relevant Orders   CT ABDOMEN PELVIS W WO CONTRAST   Pelvic pain       Relevant Orders   CT ABDOMEN PELVIS W WO CONTRAST   Grade IV hemorrhoids       Relevant Orders   Ambulatory referral to General Surgery       Return in about 1 month (around 05/11/2022).   Total time spent: 30 minutes  Mechele Claude, FNP  04/12/2022

## 2022-04-16 NOTE — ED Notes (Signed)
Discharge instructions explained to patient and family at this time. Patient and family state they understand and agree.

## 2022-04-18 ENCOUNTER — Other Ambulatory Visit: Payer: 59

## 2022-04-23 ENCOUNTER — Other Ambulatory Visit: Payer: 59

## 2022-04-23 ENCOUNTER — Telehealth: Payer: Self-pay | Admitting: *Deleted

## 2022-04-23 NOTE — Telephone Encounter (Signed)
        Patient  visited [facility] on 04/16/2022  for treatment    Telephone encounter attempt :  1st  A HIPAA compliant voice message was left requesting a return call.  Instructed patient to call back at [phone]. [place] [phone] , [place] [phone] Email : [email or web address]

## 2022-04-26 ENCOUNTER — Ambulatory Visit (INDEPENDENT_AMBULATORY_CARE_PROVIDER_SITE_OTHER): Payer: 59 | Admitting: Family

## 2022-04-26 ENCOUNTER — Encounter: Payer: Self-pay | Admitting: Family

## 2022-04-26 VITALS — BP 120/72 | HR 109 | Ht 60.0 in | Wt 105.0 lb

## 2022-04-26 DIAGNOSIS — R1030 Lower abdominal pain, unspecified: Secondary | ICD-10-CM

## 2022-04-26 DIAGNOSIS — M545 Low back pain, unspecified: Secondary | ICD-10-CM

## 2022-04-26 DIAGNOSIS — R944 Abnormal results of kidney function studies: Secondary | ICD-10-CM

## 2022-04-26 DIAGNOSIS — G8929 Other chronic pain: Secondary | ICD-10-CM

## 2022-04-26 DIAGNOSIS — N8110 Cystocele, unspecified: Secondary | ICD-10-CM

## 2022-04-26 DIAGNOSIS — R102 Pelvic and perineal pain: Secondary | ICD-10-CM | POA: Diagnosis not present

## 2022-04-26 DIAGNOSIS — K643 Fourth degree hemorrhoids: Secondary | ICD-10-CM

## 2022-04-26 DIAGNOSIS — F419 Anxiety disorder, unspecified: Secondary | ICD-10-CM

## 2022-04-30 ENCOUNTER — Other Ambulatory Visit: Payer: Self-pay | Admitting: Family

## 2022-04-30 ENCOUNTER — Ambulatory Visit (INDEPENDENT_AMBULATORY_CARE_PROVIDER_SITE_OTHER): Payer: 59

## 2022-04-30 DIAGNOSIS — R944 Abnormal results of kidney function studies: Secondary | ICD-10-CM | POA: Diagnosis not present

## 2022-04-30 DIAGNOSIS — R102 Pelvic and perineal pain unspecified side: Secondary | ICD-10-CM

## 2022-04-30 DIAGNOSIS — R1030 Lower abdominal pain, unspecified: Secondary | ICD-10-CM | POA: Diagnosis not present

## 2022-04-30 LAB — BUN PICCOLO, WAIVED: BUN Piccolo, Waived: 15 mg/dL (ref 7–22)

## 2022-04-30 LAB — CREATININE PICCOLO, WAIVED: Creatinine Piccolo, Waived: 0.8 mg/dL (ref 0.6–1.2)

## 2022-04-30 MED ORDER — IOHEXOL 300 MG/ML  SOLN
100.0000 mL | Freq: Once | INTRAMUSCULAR | Status: AC | PRN
  Administered 2022-04-30: 100 mL via INTRAVENOUS

## 2022-05-02 ENCOUNTER — Encounter: Payer: Self-pay | Admitting: Family

## 2022-05-02 ENCOUNTER — Ambulatory Visit (INDEPENDENT_AMBULATORY_CARE_PROVIDER_SITE_OTHER): Payer: Medicare Other | Admitting: Family

## 2022-05-02 VITALS — BP 124/70 | HR 107 | Ht 60.0 in | Wt 104.4 lb

## 2022-05-02 DIAGNOSIS — N8110 Cystocele, unspecified: Secondary | ICD-10-CM | POA: Diagnosis not present

## 2022-05-02 DIAGNOSIS — R911 Solitary pulmonary nodule: Secondary | ICD-10-CM

## 2022-05-02 DIAGNOSIS — R102 Pelvic and perineal pain: Secondary | ICD-10-CM

## 2022-05-02 DIAGNOSIS — J441 Chronic obstructive pulmonary disease with (acute) exacerbation: Secondary | ICD-10-CM | POA: Diagnosis not present

## 2022-05-03 NOTE — Progress Notes (Signed)
Established Patient Office Visit  Subjective:  Patient ID: Sandra Stout, female    DOB: 05-Nov-1945  Age: 77 y.o. MRN: [id number]  Chief Complaint  Patient presents with   Follow-up    1 month follow up    Patient is here today for follow-up appointment. She is accompanied by her granddaughter and daughter.  They have some concerns regarding her current issues.  Patient is continuing to complain of severe pain pelvic area as well as severe hemorrhoids.  She has been referred from the hemorrhoids and her CT scan was delayed due to issues with our network connection earlier this week.  This has been rescheduled for next week.  No other concerns at this time.     Past Medical History:  Diagnosis Date   Acute respiratory failure with hypoxia ([place]) 10/06/2018   ADD (attention deficit disorder)    Anxiety    Community acquired pneumonia 10/06/2018   Hemorrhoid    Hypokalemia    Narcolepsy    Panic attacks    Pneumonia     Past Surgical History:  Procedure Laterality Date   TUBAL LIGATION      Social History   Socioeconomic History   Marital status: Married    Spouse name: Not on file   Number of children: Not on file   Years of education: Not on file   Highest education level: Not on file  Occupational History   Occupation: retired  Tobacco Use   Smoking status: Every Day    Packs/day: 0.50    Types: Cigarettes   Smokeless tobacco: Never  Substance and Sexual Activity   Alcohol use: No   Drug use: No   Sexual activity: Not Currently  Other Topics Concern   Not on file  Social History Narrative   Not on file   Social Determinants of Health   Financial Resource Strain: Low Risk  (07/11/2021)   Overall Financial Resource Strain (CARDIA)    Difficulty of Paying Living Expenses: Not hard at all  Food Insecurity: No Food Insecurity (07/11/2021)   Hunger Vital Sign    Worried About Running Out of Food in the Last Year: Never true    [place] in the Last  Year: Never true  Transportation Needs: No Transportation Needs (10/01/2021)   PRAPARE - Hydrologist (Medical): No    Lack of Transportation (Non-Medical): No  Recent Concern: Transportation Needs - Unmet Transportation Needs (07/11/2021)   PRAPARE - Transportation    Lack of Transportation (Medical): Yes    Lack of Transportation (Non-Medical): Yes  Physical Activity: Inactive (07/11/2021)   Exercise Vital Sign    Days of Exercise per Week: 0 days    Minutes of Exercise per Session: 0 min  Stress: Stress Concern Present (07/11/2021)   [place]    Feeling of Stress : To some extent  Social Connections: Socially Isolated (07/11/2021)   Social Connection and Isolation Panel [NHANES]    Frequency of Communication with Friends and Family: More than three times a week    Frequency of Social Gatherings with Friends and Family: Once a week    Attends Religious Services: Never    Marine scientist or Organizations: No    Attends Archivist Meetings: Never    Marital Status: Widowed  Intimate Partner Violence: Not At Risk (07/11/2021)   Humiliation, Afraid, Rape, and Kick questionnaire    Fear  of Current or Ex-Partner: No    Emotionally Abused: No    Physically Abused: No    Sexually Abused: No    Family History  Problem Relation Age of Onset   Lung disease Mother    Thyroid disease Sister    Lung disease Sister    Cancer Sister        Bladder   Heart disease Father    Breast cancer Neg Hx     Allergies  Allergen Reactions   Baclofen     Review of Systems  Constitutional:  Positive for malaise/fatigue.  Eyes:  Positive for blurred vision.  Gastrointestinal:  Positive for constipation.       Rectal pain  Genitourinary:        Severe pelvic pain. Patient has been told that she has a "dropped bladder".  Musculoskeletal:  Positive for back pain.  Neurological:  Positive  for tremors.  Psychiatric/Behavioral:  Positive for depression. The patient is nervous/anxious.        Objective:   BP 120/72   Pulse (!) 109   Ht 5' (1.524 m)   Wt 105 lb (47.6 kg)   LMP  (LMP Unknown)   SpO2 92%   BMI 20.51 kg/m   Vitals:   04/26/22 1356  BP: 120/72  Pulse: (!) 109  Height: 5' (1.524 m)  Weight: 105 lb (47.6 kg)  SpO2: 92%  BMI (Calculated): 20.51    Physical Exam Vitals and nursing note reviewed.  Constitutional:      General: She is awake. She is in acute distress.     Appearance: She is normal weight. She is ill-appearing. She is not toxic-appearing or diaphoretic.  Neurological:     Mental Status: She is alert and oriented to person, place, and time. Mental status is at baseline.     Motor: Tremor present.  Psychiatric:        Mood and Affect: Mood is anxious and depressed. Affect is tearful.        Behavior: Behavior is cooperative.      No results found for any visits on 04/26/22.  Recent Results (from the past 2160 hour(s))  Urinalysis, Routine w reflex microscopic -Urine, Clean Catch     Status: Abnormal   Collection Time: 04/16/22  4:15 PM  Result Value Ref Range   Color, Urine STRAW (A) YELLOW   APPearance CLEAR (A) CLEAR   Specific Gravity, Urine 1.006 1.005 - 1.030   pH 6.0 5.0 - 8.0   Glucose, UA NEGATIVE NEGATIVE mg/dL   Hgb urine dipstick NEGATIVE NEGATIVE   Bilirubin Urine NEGATIVE NEGATIVE   Ketones, ur NEGATIVE NEGATIVE mg/dL   Protein, ur NEGATIVE NEGATIVE mg/dL   Nitrite NEGATIVE NEGATIVE   Leukocytes,Ua NEGATIVE NEGATIVE    Comment: Performed at [facility], [address] [place], [place] [address]  BUN [place], [place]     Status: None   Collection Time: 04/30/22 12:00 AM  Result Value Ref Range   BUN [place], Waived 15 7 - 22 mg/dL  Creatinine [place], Waived     Status: None   Collection Time: 04/30/22 12:00 AM  Result Value Ref Range   Creatinine [place], Waived 0.8 0.6 - 1.2 mg/dL       Assessment & Plan:   Problem List Items Addressed This Visit     Anxiety   Chronic back pain   Vaginal prolapse   Other Visit Diagnoses     Abnormal kidney function study    -  Primary   Relevant Orders   BUN [place], Waived   Creatinine [place], [place]   Pelvic pain       Grade IV hemorrhoids       Lower abdominal pain          CT scan has been scheduled for next week. I will see the patient back after CT scan is done in 1 week.  We will determine the next steps based on results.  Follow-up in 1 week  Total time spent: 30 minutes  Mechele Claude, FNP  04/26/2022

## 2022-05-05 ENCOUNTER — Encounter: Payer: Self-pay | Admitting: Family

## 2022-05-05 DIAGNOSIS — R911 Solitary pulmonary nodule: Secondary | ICD-10-CM | POA: Insufficient documentation

## 2022-05-05 NOTE — Progress Notes (Signed)
Established Patient Office Visit  Subjective:  Patient ID: Sandra Stout, female    DOB: Jan 13, 1946  Age: 77 y.o. MRN: [id number]  Chief Complaint  Patient presents with   Follow-up    1 week follow up    Patient is here for results She has had her CT scan and we are discussing this today.  Had nodule of lung on CT, needs further evaluation.   Otherwise, CT scan showed uterine calcification, but not any other concerns or explanations for her pain.   She has heard from the surgeon re: her appointment for the hemorrhoids.   No other concerns today     Past Medical History:  Diagnosis Date   Acute respiratory failure with hypoxia ([place]) 10/06/2018   ADD (attention deficit disorder)    Anxiety    Community acquired pneumonia 10/06/2018   Hemorrhoid    Hypokalemia    Hypokalemia 03/08/2021   Narcolepsy    Panic attacks    Pneumonia    Right middle lobe pneumonia 03/07/2021   Upper respiratory tract infection 03/02/2021   Wheezing 03/02/2021    Past Surgical History:  Procedure Laterality Date   TUBAL LIGATION      Social History   Socioeconomic History   Marital status: Married    Spouse name: Not on file   Number of children: Not on file   Years of education: Not on file   Highest education level: Not on file  Occupational History   Occupation: retired  Tobacco Use   Smoking status: Every Day    Packs/day: 0.50    Types: Cigarettes   Smokeless tobacco: Never  Substance and Sexual Activity   Alcohol use: No   Drug use: No   Sexual activity: Not Currently  Other Topics Concern   Not on file  Social History Narrative   Not on file   Social Determinants of Health   Financial Resource Strain: Low Risk  (07/11/2021)   Overall Financial Resource Strain (CARDIA)    Difficulty of Paying Living Expenses: Not hard at all  Food Insecurity: No Food Insecurity (07/11/2021)   Hunger Vital Sign    Worried About Running Out of Food in the Last Year: Never true     [place] in the Last Year: Never true  Transportation Needs: No Transportation Needs (10/01/2021)   PRAPARE - Hydrologist (Medical): No    Lack of Transportation (Non-Medical): No  Recent Concern: Transportation Needs - Unmet Transportation Needs (07/11/2021)   PRAPARE - Transportation    Lack of Transportation (Medical): Yes    Lack of Transportation (Non-Medical): Yes  Physical Activity: Inactive (07/11/2021)   Exercise Vital Sign    Days of Exercise per Week: 0 days    Minutes of Exercise per Session: 0 min  Stress: Stress Concern Present (07/11/2021)   [place]    Feeling of Stress : To some extent  Social Connections: Socially Isolated (07/11/2021)   Social Connection and Isolation Panel [NHANES]    Frequency of Communication with Friends and Family: More than three times a week    Frequency of Social Gatherings with Friends and Family: Once a week    Attends Religious Services: Never    Marine scientist or Organizations: No    Attends Archivist Meetings: Never    Marital Status: Widowed  Intimate Partner Violence: Not At Risk (07/11/2021)   Humiliation, Afraid,  Rape, and Kick questionnaire    Fear of Current or Ex-Partner: No    Emotionally Abused: No    Physically Abused: No    Sexually Abused: No    Family History  Problem Relation Age of Onset   Lung disease Mother    Thyroid disease Sister    Lung disease Sister    Cancer Sister        Bladder   Heart disease Father    Breast cancer Neg Hx     Allergies  Allergen Reactions   Baclofen     Review of Systems  Eyes:  Positive for blurred vision.  Gastrointestinal:        Rectal pain  Genitourinary:        Vaginal pain/pubic pain.  Musculoskeletal:  Positive for back pain.  Psychiatric/Behavioral:  Positive for depression. The patient is nervous/anxious.   All other systems reviewed and are  negative.      Objective:   BP 124/70   Pulse (!) 107   Ht 5' (1.524 m)   Wt 104 lb 6.4 oz (47.4 kg)   LMP  (LMP Unknown)   SpO2 92%   BMI 20.39 kg/m   Vitals:   05/02/22 1140  BP: 124/70  Pulse: (!) 107  Height: 5' (1.524 m)  Weight: 104 lb 6.4 oz (47.4 kg)  SpO2: 92%  BMI (Calculated): 20.39    Physical Exam Vitals and nursing note reviewed.  Constitutional:      Appearance: Normal appearance. She is normal weight.  HENT:     Head: Normocephalic.  Eyes:     Pupils: Pupils are equal, round, and reactive to light.  Cardiovascular:     Rate and Rhythm: Normal rate.  Pulmonary:     Effort: Pulmonary effort is normal.  Musculoskeletal:        General: Normal range of motion.     Cervical back: Normal range of motion.  Skin:    Capillary Refill: Capillary refill takes less than 2 seconds.  Neurological:     General: No focal deficit present.     Mental Status: She is alert and oriented to person, place, and time.     Comments: Patient appears to have tardive dyskinesia.    Psychiatric:        Mood and Affect: Mood is anxious and depressed. Affect is tearful.        Speech: Speech normal.        Behavior: Behavior normal. Behavior is cooperative.        Thought Content: Thought content normal.        Cognition and Memory: Memory is impaired.        Judgment: Judgment normal.      No results found for any visits on 05/02/22.  Recent Results (from the past 2160 hour(s))  Urinalysis, Routine w reflex microscopic -Urine, Clean Catch     Status: Abnormal   Collection Time: 04/16/22  4:15 PM  Result Value Ref Range   Color, Urine STRAW (A) YELLOW   APPearance CLEAR (A) CLEAR   Specific Gravity, Urine 1.006 1.005 - 1.030   pH 6.0 5.0 - 8.0   Glucose, UA NEGATIVE NEGATIVE mg/dL   Hgb urine dipstick NEGATIVE NEGATIVE   Bilirubin Urine NEGATIVE NEGATIVE   Ketones, ur NEGATIVE NEGATIVE mg/dL   Protein, ur NEGATIVE NEGATIVE mg/dL   Nitrite NEGATIVE NEGATIVE    Leukocytes,Ua NEGATIVE NEGATIVE    Comment: Performed at [facility], [address] [place],  [place] [address]  [place]UN [place], [place]     Status: None   Collection Time: 04/30/22 12:00 AM  Result Value Ref Range   BUN Piccol[place], Waived 15 7 - 22 mg/dL  Creatinine [place], [place]     Status: None   Collection Time: 04/30/22 12:00 AM  Result Value Ref Range   Creatinine Piccol[place], Waived 0.8 0.6 - 1.2 mg/dL      Assessment & Plan:   Problem List Items Addressed This Visit     Vaginal prolapse    Will get a Pelvis/transvaginal U/S  Also referring pt. To Ob/Gyn.          Nodule of right lung   Relevant Orders   CT CHEST W CONTRAST   Other Visit Diagnoses     Vaginal pain    -  Primary   Relevant Orders   US Pelvic Complete With Transvaginal   Ambulatory referral to Obstetrics / Gynecology   Chronic obstructive pulmonary disease with (acute) exacerbation (HCC)   (Chronic)         Return in about 1 month (around 06/02/2022).   Total time spent: 30 minutes  Mechele Claude, FNP  05/02/2022

## 2022-05-05 NOTE — Assessment & Plan Note (Signed)
Will get a Pelvis/transvaginal U/S  Also referring pt. To Ob/Gyn.

## 2022-05-07 ENCOUNTER — Encounter: Payer: Self-pay | Admitting: Surgery

## 2022-05-07 ENCOUNTER — Ambulatory Visit (INDEPENDENT_AMBULATORY_CARE_PROVIDER_SITE_OTHER): Payer: 59 | Admitting: Surgery

## 2022-05-07 VITALS — BP 129/70 | HR 93 | Temp 98.0°F | Ht 60.0 in | Wt 105.8 lb

## 2022-05-07 DIAGNOSIS — K644 Residual hemorrhoidal skin tags: Secondary | ICD-10-CM

## 2022-05-07 DIAGNOSIS — G8929 Other chronic pain: Secondary | ICD-10-CM

## 2022-05-07 DIAGNOSIS — R102 Pelvic and perineal pain: Secondary | ICD-10-CM | POA: Diagnosis not present

## 2022-05-07 DIAGNOSIS — F419 Anxiety disorder, unspecified: Secondary | ICD-10-CM
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
## 2022-05-07 NOTE — Patient Instructions (Addendum)
On exam we did not see any swollen hemorrhoids. Only some small skin tags. We did not find any swollen internal hemorrhoids either.   Do call the OB/GYN office to schedule an appointment as soon as possible to be seen and get examined.   We recommend that you take a fiber supplement to keep your stools easy and soft. You may try Benefiber, Metamucil, or Citrucel(you may use the Generic brands of any of these). You need to take a supplement every day, do not skip days. Be sure to drink plenty of fluids so the fiber you do take can be more effective.   Subsequent utilization of MiraLAX may be needed to ensure at least daily movement, ideally twice daily bowel movements.  Never skip a day...  To be regular, we must do the above EVERY day.

## 2022-05-09 ENCOUNTER — Other Ambulatory Visit: Payer: Medicare Other

## 2022-05-09 ENCOUNTER — Ambulatory Visit: Payer: Self-pay | Admitting: Surgery

## 2022-05-10 ENCOUNTER — Ambulatory Visit: Payer: Medicare Other | Admitting: Family

## 2022-05-13 ENCOUNTER — Ambulatory Visit (INDEPENDENT_AMBULATORY_CARE_PROVIDER_SITE_OTHER): Payer: Medicare Other

## 2022-05-13 DIAGNOSIS — R911 Solitary pulmonary nodule: Secondary | ICD-10-CM | POA: Diagnosis not present

## 2022-05-13 MED ORDER — IOHEXOL 300 MG/ML  SOLN
100.0000 mL | Freq: Once | INTRAMUSCULAR | Status: AC | PRN
  Administered 2022-05-13: 100 mL via INTRAVENOUS

## 2022-05-23 ENCOUNTER — Encounter: Payer: Self-pay | Admitting: Obstetrics and Gynecology

## 2022-05-23 ENCOUNTER — Ambulatory Visit (INDEPENDENT_AMBULATORY_CARE_PROVIDER_SITE_OTHER): Payer: 59 | Admitting: Obstetrics and Gynecology

## 2022-05-23 VITALS — BP 114/76 | HR 91 | Ht 60.0 in | Wt 103.1 lb

## 2022-05-23 DIAGNOSIS — R102 Pelvic and perineal pain: Secondary | ICD-10-CM | POA: Diagnosis not present

## 2022-05-23 DIAGNOSIS — Z7689 Persons encountering health services in other specified circumstances: Secondary | ICD-10-CM
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
## 2022-05-23 NOTE — Progress Notes (Signed)
HPI:      [name] is a 77 y.o. G2P2 who LMP was No LMP recorded (lmp unknown). Patient is postmenopausal.  Subjective:   She presents today stating that she has more than 3 months of worsening pain in her uterus.  She states that she wakes up with pain in her uterus.  It is worse with urination.  Is worse with bowel movements.  She has previously been seen at [facility] multiple times for this, a pessary was placed and did not help her pain.  Her workup so far has been negative other than some lower spine issues.  She reports no vaginal bleeding or discharge.  She has had a workup for "hemorrhoids" which has also proven to be negative.    Hx: The following portions of the patient's history were reviewed and updated as appropriate:             She  has a past medical history of Acute respiratory failure with hypoxia ([place]) (10/06/2018), ADD (attention deficit disorder), Anxiety, Community acquired pneumonia (10/06/2018), Hemorrhoid, Hypokalemia, Hypokalemia (03/08/2021), Narcolepsy, Panic attacks, Pneumonia, Right middle lobe pneumonia (03/07/2021), Upper respiratory tract infection (03/02/2021), and Wheezing (03/02/2021). She does not have any pertinent problems on file. She  has a past surgical history that includes Tubal ligation. Her family history includes Cancer in her sister; Heart disease in her father; Lung disease in her mother and sister; Thyroid disease in her sister. She  reports that she has been smoking cigarettes. She has been smoking an average of .5 packs per day. She has been exposed to tobacco smoke. She has never used smokeless tobacco. She reports that she does not drink alcohol and does not use drugs. She has a current medication list which includes the following prescription(s): alprazolam, buspirone, co-enzyme q-10, diclofenac sodium, guaifenesin, lidocaine, [START ON 06/08/2022] methylphenidate, mirtazapine, fish oil, trazodone, trazodone, balanced b-50, and witch  hazel-glycerin. She is allergic to baclofen.       Review of Systems:  Review of Systems  Constitutional: Denied constitutional symptoms, night sweats, recent illness, fatigue, fever, insomnia and weight loss.  Eyes: Denied eye symptoms, eye pain, photophobia, vision change and visual disturbance.  Ears/Nose/Throat/Neck: Denied ear, nose, throat or neck symptoms, hearing loss, nasal discharge, sinus congestion and sore throat.  Cardiovascular: Denied cardiovascular symptoms, arrhythmia, chest pain/pressure, edema, exercise intolerance, orthopnea and palpitations.  Respiratory: Denied pulmonary symptoms, asthma, pleuritic pain, productive sputum, cough, dyspnea and wheezing.  Gastrointestinal: Denied, gastro-esophageal reflux, melena, nausea and vomiting.  Genitourinary: See HPI for additional information.  Musculoskeletal: Denied musculoskeletal symptoms, stiffness, swelling, muscle weakness and myalgia.  Dermatologic: Denied dermatology symptoms, rash and scar.  Neurologic: Denied neurology symptoms, dizziness, headache, neck pain and syncope.  Psychiatric: Denied psychiatric symptoms, anxiety and depression.  Endocrine: Denied endocrine symptoms including hot flashes and night sweats.   Meds:   Current Outpatient Medications on File Prior to Visit  Medication Sig Dispense Refill   ALPRAZolam (XANAX) 1 MG tablet Take 1 mg by mouth 2 (two) times daily as needed for anxiety.     busPIRone (BUSPAR) 15 MG tablet Take 15 mg by mouth 3 (three) times daily.     co-enzyme Q-10 50 MG capsule Take 50 mg by mouth daily.     diclofenac Sodium (VOLTAREN) 1 % GEL Apply 2 g topically 4 (four) times daily. 100 g 0   guaiFENesin (MUCINEX) 600 MG 12 hr tablet Take 1 tablet (600 mg total) by mouth 2 (two) times daily.  lidocaine (LIDODERM) 5 % Place 1 patch onto the skin every 12 (twelve) hours. Remove & Discard patch within 12 hours or as directed by MD 15 patch 0   [START ON 06/08/2022]  methylphenidate (RITALIN) 20 MG tablet Take 3-4 tablets by mouth. Per day in divided doses     mirtazapine (REMERON) 30 MG tablet Take 15 mg by mouth at bedtime.     Omega-3 Fatty Acids (FISH OIL) 1000 MG CAPS Take by mouth.     traZODone (DESYREL) 150 MG tablet Take 150 mg by mouth at bedtime.     traZODone (DESYREL) 50 MG tablet Take 0.5 tablets (25 mg total) by mouth at bedtime as needed for sleep.     Vitamins-Lipotropics (BALANCED B-50) TABS Take by mouth.     witch hazel-glycerin (TUCKS) pad Apply 1 Application topically as needed for itching. 40 each 12   No current facility-administered medications on file prior to visit.      Objective:     Vitals:   05/23/22 1432  BP: 114/76  Pulse: 91   Filed Weights   05/23/22 1432  Weight: 103 lb 1.6 oz (46.8 kg)              Physical examination   Pelvic:   Vulva: Normal appearance.  No lesions.  Vagina: No lesions or abnormalities noted.  Support: Second-degree uterine descensus no significant cystocele or rectocele.  Urethra No masses tenderness or scarring.  Meatus Normal size without lesions or prolapse.  Cervix: Normal appearance.  No lesions.  Anus: Normal exam.  No lesions.  Perineum: Normal exam.  No lesions.        Bimanual   Uterus: Normal size.  Non-tender.  Mobile.  AV.  Minimal pain with uterine palpation  Adnexae: No masses.  Non-tender to palpation.  Cul-de-sac: Negative for abnormality.             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 Establishing care with new doctor, encounter for   2.  Vaginal pain     I have also reviewed her CT scan of the pelvis which shows no significant abnormalities.  I find no obvious source of her pain that includes her bladder rectum and uterus.  Cannot tell if her back pain is secondary to disc disease as noted on CT.  Plan:            1.  Consider or therapeutic/urology consult.  I do not think this is a GYN issue.  Especially in light of the fact that a pessary had no effect and she has minimal uterine descensus and a normal CT.     Orders No orders of the defined types were placed in this encounter.   No orders of the defined types were placed in this encounter.     F/U  No follow-ups on file. I spent 35 she has never felt the baby move she reports the last it was born at.  She did not know she was pregnant with that the baby yes that she wants to go through [place]  get childcare arranged and get off work yes I will make some difference in composition, yes there is some increased risk for DIC but I do not know how much or how significant this is minutes involved in the care of this patient preparing to see the patient by obtaining and reviewing her medical history (including labs, imaging tests and prior procedures), documenting clinical information in the electronic health record (EHR), counseling and coordinating care plans, writing and sending prescriptions, ordering tests or procedures and in direct communicating with the patient and medical staff discussing pertinent items from her history and physical exam.  Finis Bud, M.D. 05/23/2022 4:22 PM

## 2022-06-03 ENCOUNTER — Ambulatory Visit: Payer: Medicare Other | Admitting: Family

## 2022-07-10 ENCOUNTER — Ambulatory Visit: Payer: Medicare Other | Admitting: Family

## 2022-07-15 ENCOUNTER — Encounter: Payer: Self-pay | Admitting: Family

## 2022-07-15 ENCOUNTER — Ambulatory Visit (INDEPENDENT_AMBULATORY_CARE_PROVIDER_SITE_OTHER): Payer: Medicare Other | Admitting: Family

## 2022-07-15 VITALS — BP 120/68 | HR 106 | Ht 60.0 in | Wt 105.6 lb

## 2022-07-15 DIAGNOSIS — J449 Chronic obstructive pulmonary disease, unspecified: Secondary | ICD-10-CM | POA: Diagnosis not present

## 2022-07-15 DIAGNOSIS — R911 Solitary pulmonary nodule: Secondary | ICD-10-CM | POA: Diagnosis not present

## 2022-07-15 DIAGNOSIS — F331 Major depressive disorder, recurrent, moderate: Secondary | ICD-10-CM

## 2022-07-15 NOTE — Progress Notes (Signed)
Established Patient Office Visit  Subjective:  Patient ID: Sandra Stout, female    DOB: 04-05-1945  Age: 77 y.o. MRN: [id number]  Chief Complaint  Patient presents with   Follow-up    Follow up    Patient is here for follow up.  She had her appointments with OB/GYN, but was so tired after that that she did not make it to her last scheduled follow up with me  We did call her about her CT scan, but she has not yet heard from the specialist we referred her to.   No other concerns at this time.   Past Medical History:  Diagnosis Date   Acute respiratory failure with hypoxia (HCC) 10/06/2018   ADD (attention deficit disorder)    Anxiety    Community acquired pneumonia 10/06/2018   Hemorrhoid    Hypokalemia    Hypokalemia 03/08/2021   Narcolepsy    Panic attacks    Pneumonia    Right middle lobe pneumonia 03/07/2021   Upper respiratory tract infection 03/02/2021   Wheezing 03/02/2021    Past Surgical History:  Procedure Laterality Date   TUBAL LIGATION      Social History   Socioeconomic History   Marital status: Married    Spouse name: Not on file   Number of children: Not on file   Years of education: Not on file   Highest education level: Not on file  Occupational History   Occupation: retired  Tobacco Use   Smoking status: Every Day    Packs/day: .5    Types: Cigarettes    Passive exposure: Past   Smokeless tobacco: Never  Vaping Use   Vaping Use: Never used  Substance and Sexual Activity   Alcohol use: No   Drug use: No   Sexual activity: Not Currently    Birth control/protection: Post-menopausal, Surgical  Other Topics Concern   Not on file  Social History Narrative   Not on file   Social Determinants of Health   Financial Resource Strain: Low Risk  (07/11/2021)   Overall Financial Resource Strain (CARDIA)    Difficulty of Paying Living Expenses: Not hard at all  Food Insecurity: No Food Insecurity (07/11/2021)   Hunger Vital Sign    Worried  About Running Out of Food in the Last Year: Never true    Ran Out of Food in the Last Year: Never true  Transportation Needs: No Transportation Needs (10/01/2021)   PRAPARE - Administrator, Civil Service (Medical): No    Lack of Transportation (Non-Medical): No  Recent Concern: Transportation Needs - Unmet Transportation Needs (07/11/2021)   PRAPARE - Transportation    Lack of Transportation (Medical): Yes    Lack of Transportation (Non-Medical): Yes  Physical Activity: Inactive (07/11/2021)   Exercise Vital Sign    Days of Exercise per Week: 0 days    Minutes of Exercise per Session: 0 min  Stress: Stress Concern Present (07/11/2021)   [employer] of Occupational Health - Occupational Stress Questionnaire    Feeling of Stress : To some extent  Social Connections: Socially Isolated (07/11/2021)   Social Connection and Isolation Panel [NHANES]    Frequency of Communication with Friends and Family: More than three times a week    Frequency of Social Gatherings with Friends and Family: Once a week    Attends Religious Services: Never    Database administrator or Organizations: No    Attends Banker Meetings: Never  Marital Status: Widowed  Intimate Partner Violence: Not At Risk (07/11/2021)   Humiliation, Afraid, Rape, and Kick questionnaire    Fear of Current or Ex-Partner: No    Emotionally Abused: No    Physically Abused: No    Sexually Abused: No    Family History  Problem Relation Age of Onset   Lung disease Mother    Thyroid disease Sister    Lung disease Sister    Cancer Sister        Bladder   Heart disease Father    Breast cancer Neg Hx     Allergies  Allergen Reactions   Baclofen     Review of Systems  Neurological:  Positive for tremors.  Psychiatric/Behavioral:  Positive for depression. The patient is nervous/anxious.   All other systems reviewed and are negative.      Objective:   BP 120/68   Pulse (!) 106   Ht 5' (1.524  m)   Wt 105 lb 9.6 oz (47.9 kg)   LMP  (LMP Unknown)   SpO2 92%   BMI 20.62 kg/m   Vitals:   07/15/22 1309  BP: 120/68  Pulse: (!) 106  Height: 5' (1.524 m)  Weight: 105 lb 9.6 oz (47.9 kg)  SpO2: 92%  BMI (Calculated): 20.62    Physical Exam Vitals reviewed.  Constitutional:      General: She is not in acute distress.    Appearance: Normal appearance. She is normal weight. She is ill-appearing. She is not toxic-appearing.  Eyes:     Extraocular Movements: Extraocular movements intact.     Conjunctiva/sclera: Conjunctivae normal.     Pupils: Pupils are equal, round, and reactive to light.  Neurological:     Mental Status: She is alert and oriented to person, place, and time. Mental status is at baseline.  Psychiatric:        Attention and Perception: Attention and perception normal.        Mood and Affect: Mood is anxious.        Speech: Speech normal.        Behavior: Behavior normal. Behavior is cooperative.        Cognition and Memory: Cognition and memory normal.        Judgment: Judgment normal.      No results found for any visits on 07/15/22.  Recent Results (from the past 2160 hour(s))  BUN Piccolo, [place]     Status: None   Collection Time: 04/30/22 12:00 AM  Result Value Ref Range   BUN Piccolo, Waived 15 7 - 22 mg/dL  Creatinine Piccolo, Waived     Status: None   Collection Time: 04/30/22 12:00 AM  Result Value Ref Range   Creatinine Piccolo, Waived 0.8 0.6 - 1.2 mg/dL       Assessment & Plan:   Problem List Items Addressed This Visit       Active Problems   Chronic obstructive pulmonary disease (HCC)    Patient is working to stop smoking and says that she has been doing fairly well.  She says that her smoking has been more related to her anxiety than to nicotine dependence.   She is currently well controlled and has no issues with her breathing.   Will reassess at follow up.       Incidental lung nodule, greater than or equal to 8mm -  Primary    Per recommendations from radiology, will get PET scan to further evaluate.  Patient also given the  number to Dr. Reita Cliche office, so that they may call.       Relevant Orders   NM PET Image Initial (PI) Skull Base To Thigh (F-18 FDG)   Moderate episode of recurrent major depressive disorder [facility])    Patient has a psychiatrist who treats her depression and anxiety, as well as her ADHD.  As such, I will defer to him for any further changes that might be needed.  Continue current therapy.   Will reassess at follow up.        Return to be scheduled based on results..   Total time spent: 30 minutes  Miki Kins, FNP  07/15/2022   This document may have been prepared by [facility] Voice Recognition software and as such may include unintentional dictation errors.

## 2022-07-15 NOTE — Assessment & Plan Note (Signed)
Patient is working to stop smoking and says that she has been doing fairly well.  She says that her smoking has been more related to her anxiety than to nicotine dependence.   She is currently well controlled and has no issues with her breathing.   Will reassess at follow up.

## 2022-07-15 NOTE — Assessment & Plan Note (Signed)
Patient has a psychiatrist who treats her depression and anxiety, as well as her ADHD.  As such, I will defer to him for any further changes that might be needed.  Continue current therapy.   Will reassess at follow up.

## 2022-07-15 NOTE — Assessment & Plan Note (Signed)
Per recommendations from radiology, will get PET scan to further evaluate.  Patient also given the number to Dr. Reita Cliche office, so that they may call.

## 2022-08-01 ENCOUNTER — Other Ambulatory Visit: Payer: 59

## 2022-08-08 ENCOUNTER — Ambulatory Visit
Admission: RE | Admit: 2022-08-08 | Discharge: 2022-08-08 | Disposition: A | Discharge status: Home / Self Care | Payer: 59 | Source: Ambulatory Visit | Attending: Family | Admitting: Family

## 2022-08-08 DIAGNOSIS — R911 Solitary pulmonary nodule: Secondary | ICD-10-CM | POA: Diagnosis not present

## 2022-08-08 LAB — GLUCOSE, CAPILLARY: Glucose-Capillary: 89 mg/dL (ref 70–99)

## 2022-08-08 MED ORDER — FLUDEOXYGLUCOSE F - 18 (FDG) INJECTION: 5.2000 | Freq: Once | INTRAVENOUS | Status: DC | PRN

## 2022-08-08 MED ORDER — FLUDEOXYGLUCOSE F - 18 (FDG) INJECTION
5.4000 | Freq: Once | INTRAVENOUS | Status: AC | PRN
  Administered 2022-08-08: 5.92 via INTRAVENOUS

## 2022-08-21 ENCOUNTER — Other Ambulatory Visit: Payer: Self-pay | Admitting: Family

## 2022-08-21 DIAGNOSIS — R911 Solitary pulmonary nodule: Secondary | ICD-10-CM

## 2022-08-22 NOTE — Progress Notes (Signed)
Try to LVM but VM not set up at this time.

## 2022-08-23 NOTE — Progress Notes (Signed)
VM not set up at this time.

## 2022-09-04 NOTE — Progress Notes (Signed)
Patients number now had calling restrictions due number not being in service.

## 2022-09-18 ENCOUNTER — Telehealth: Payer: Self-pay

## 2022-09-18 NOTE — Telephone Encounter (Signed)
Pt called DOB verified and was informed of test results back from 08/21/22. Pt was informed referral was sent off and was inform a contact information for referral; pt verbalized understanding.

## 2022-12-17 DIAGNOSIS — H2513 Age-related nuclear cataract, bilateral: Secondary | ICD-10-CM | POA: Diagnosis not present

## 2023-01-02 ENCOUNTER — Encounter: Payer: Self-pay | Admitting: Family

## 2023-01-02 ENCOUNTER — Ambulatory Visit (INDEPENDENT_AMBULATORY_CARE_PROVIDER_SITE_OTHER): Payer: 59 | Admitting: Family

## 2023-01-02 VITALS — BP 116/76 | HR 106 | Ht 62.0 in | Wt 97.0 lb

## 2023-01-02 DIAGNOSIS — Z0130 Encounter for examination of blood pressure without abnormal findings: Secondary | ICD-10-CM

## 2023-01-02 DIAGNOSIS — Z23 Encounter for immunization: Secondary | ICD-10-CM | POA: Diagnosis not present

## 2023-01-02 DIAGNOSIS — Z0000 Encounter for general adult medical examination without abnormal findings: Secondary | ICD-10-CM

## 2023-01-02 NOTE — Patient Instructions (Signed)

## 2023-01-02 NOTE — Progress Notes (Signed)
Annual Wellness Visit  Patient: Sandra Stout, Female    DOB: 1945/12/01, 77 y.o.   MRN: [id number] Visit Date: 01/02/2023  Today's Provider: Miki Kins, FNP  Subjective:    Chief Complaint  Patient presents with   Annual Exam    AWV   Sandra Stout is a 77 y.o. female who presents today for her Annual Wellness Visit.   Past Medical History:  Diagnosis Date   Acute respiratory failure with hypoxia (HCC) 10/06/2018   ADD (attention deficit disorder)    Anxiety    Community acquired pneumonia 10/06/2018   Hemorrhoid    Hypokalemia    Hypokalemia 03/08/2021   Narcolepsy    Panic attacks    Pneumonia    Right middle lobe pneumonia 03/07/2021   Upper respiratory tract infection 03/02/2021   Wheezing 03/02/2021   Past Surgical History:  Procedure Laterality Date   TUBAL LIGATION     Family History  Problem Relation Age of Onset   Lung disease Mother    Thyroid disease Sister    Lung disease Sister    Cancer Sister        Bladder   Heart disease Father    Breast cancer Neg Hx    Social History   Socioeconomic History   Marital status: Married    Spouse name: Not on file   Number of children: Not on file   Years of education: Not on file   Highest education level: Not on file  Occupational History   Occupation: retired  Tobacco Use   Smoking status: Every Day    Current packs/day: 0.50    Types: Cigarettes    Passive exposure: Past   Smokeless tobacco: Never  Vaping Use   Vaping status: Never Used  Substance and Sexual Activity   Alcohol use: No   Drug use: No   Sexual activity: Not Currently    Birth control/protection: Post-menopausal, Surgical  Other Topics Concern   Not on file  Social History Narrative   Not on file   Social Determinants of Health   Financial Resource Strain: Low Risk  (07/11/2021)   Overall Financial Resource Strain (CARDIA)    Difficulty of Paying Living Expenses: Not hard at all  Food Insecurity: No Food Insecurity  (07/11/2021)   Hunger Vital Sign    Worried About Running Out of Food in the Last Year: Never true    Ran Out of Food in the Last Year: Never true  Transportation Needs: No Transportation Needs (10/01/2021)   PRAPARE - Administrator, Civil Service (Medical): No    Lack of Transportation (Non-Medical): No  Recent Concern: Transportation Needs - Unmet Transportation Needs (07/11/2021)   PRAPARE - Transportation    Lack of Transportation (Medical): Yes    Lack of Transportation (Non-Medical): Yes  Physical Activity: Inactive (07/11/2021)   Exercise Vital Sign    Days of Exercise per Week: 0 days    Minutes of Exercise per Session: 0 min  Stress: Stress Concern Present (07/11/2021)   [employer] of Occupational Health - Occupational Stress Questionnaire    Feeling of Stress : To some extent  Social Connections: Socially Isolated (07/11/2021)   Social Connection and Isolation Panel [NHANES]    Frequency of Communication with Friends and Family: More than three times a week    Frequency of Social Gatherings with Friends and Family: Once a week    Attends Religious Services: Never    Active Member of  Clubs or Organizations: No    Attends Banker Meetings: Never    Marital Status: Widowed  Intimate Partner Violence: Not At Risk (07/11/2021)   Humiliation, Afraid, Rape, and Kick questionnaire    Fear of Current or Ex-Partner: No    Emotionally Abused: No    Physically Abused: No    Sexually Abused: No    Medications: Outpatient Medications Prior to Visit  Medication Sig   ALPRAZolam (XANAX) 1 MG tablet Take 1 mg by mouth 2 (two) times daily as needed for anxiety.   busPIRone (BUSPAR) 15 MG tablet Take 15 mg by mouth 3 (three) times daily.   co-enzyme Q-10 50 MG capsule Take 50 mg by mouth daily.   lidocaine (LIDODERM) 5 % Place 1 patch onto the skin every 12 (twelve) hours. Remove & Discard patch within 12 hours or as directed by MD   methylphenidate (RITALIN)  20 MG tablet Take 3-4 tablets by mouth. Per day in divided doses   mirtazapine (REMERON) 30 MG tablet Take 15 mg by mouth at bedtime.   Omega-3 Fatty Acids (FISH OIL) 1000 MG CAPS Take by mouth.   traZODone (DESYREL) 150 MG tablet Take 150 mg by mouth at bedtime.   Vitamins-Lipotropics (BALANCED B-50) TABS Take by mouth.   diclofenac Sodium (VOLTAREN) 1 % GEL Apply 2 g topically 4 (four) times daily. (Patient not taking: Reported on 01/02/2023)   guaiFENesin (MUCINEX) 600 MG 12 hr tablet Take 1 tablet (600 mg total) by mouth 2 (two) times daily. (Patient not taking: Reported on 01/02/2023)   traZODone (DESYREL) 50 MG tablet Take 0.5 tablets (25 mg total) by mouth at bedtime as needed for sleep. (Patient not taking: Reported on 01/02/2023)   witch hazel-glycerin (TUCKS) pad Apply 1 Application topically as needed for itching. (Patient not taking: Reported on 01/02/2023)   No facility-administered medications prior to visit.    Allergies  Allergen Reactions   Baclofen     Patient Care Team: Miki Kins, FNP as PCP - General (Family Medicine) Bridgett Larsson, LCSW as Social Worker (Licensed Clinical Social Worker)  Review of Systems  All other systems reviewed and are negative.    Objective:    Vitals: BP 116/76   Pulse (!) 106   Ht 5\' 2"  (1.575 m)   Wt 97 lb (44 kg)   LMP  (LMP Unknown)   SpO2 94%   BMI 17.74 kg/m    Physical Exam Vitals and nursing note reviewed.  Constitutional:      Appearance: Normal appearance. She is normal weight.  HENT:     Head: Normocephalic.  Eyes:     Extraocular Movements: Extraocular movements intact.     Conjunctiva/sclera: Conjunctivae normal.     Pupils: Pupils are equal, round, and reactive to light.  Cardiovascular:     Rate and Rhythm: Normal rate.  Pulmonary:     Effort: Pulmonary effort is normal.  Neurological:     General: No focal deficit present.     Mental Status: She is alert and oriented to person, place, and time.  Mental status is at baseline.  Psychiatric:        Mood and Affect: Mood normal.        Behavior: Behavior normal.        Thought Content: Thought content normal.        Judgment: Judgment normal.     Most recent functional status assessment:    01/02/2023    4:06 PM  In your present state of health, do you have any difficulty performing the following activities:  Hearing? 1  Vision? 1  Difficulty concentrating or making decisions? 1  Walking or climbing stairs? 1  Dressing or bathing? 1  Doing errands, shopping? 1  Preparing Food and eating ? Y  Using the Toilet? N  In the past six months, have you accidently leaked urine? Y  Do you have problems with loss of bowel control? N  Managing your Medications? N  Managing your Finances? N  Housekeeping or managing your Housekeeping? Y    Most recent fall risk assessment:    01/02/2023    4:07 PM  Fall Risk   Falls in the past year? 1  Number falls in past yr: 1  Injury with Fall? 0  Risk for fall due to : History of fall(s);Impaired balance/gait;Impaired mobility;Impaired vision  Follow up Falls evaluation completed;Education provided;Falls prevention discussed     Most recent depression screenings:    01/02/2023    4:09 PM 07/11/2021   12:29 PM  PHQ 2/9 Scores  PHQ - 2 Score 3 4  PHQ- 9 Score 11 9    Most recent cognitive screening:    01/02/2023    4:10 PM  6CIT Screen  What Year? 0 points  What month? 0 points  What time? 0 points  Count back from 20 0 points  Months in reverse 0 points  Repeat phrase 0 points  Total Score 0 points    No results found for any visits on 01/02/23.     Assessment & Plan:      Annual wellness visit done today including the all of the following: Reviewed patient's Family Medical History Reviewed and updated list of patient's medical providers Assessment of cognitive impairment was done Assessed patient's functional ability Established a written schedule for health  screening services Health Risk Assessent Completed and Reviewed  Exercise Activities and Dietary recommendations  Goals      Management of Stress and Pain     Care Coordination Interventions: Motivational Interviewing employed-Pt has difficulty managing depression/anxiety symptoms triggered by chronic pain Mindfulness or Relaxation training provided Active listening / Reflection utilized  Emotional Support Provided Provided psychoeducation for mental health needs  Participation in counseling encouraged  Verbalization of feelings encouraged  Crisis Resource Education /information provided       Patient Stated     01/10/2020, wants to handle stress better     Patient Stated     Would like for the Hemorid pain to be better so she can move around more with out pain        Immunization History  Administered Date(s) Administered   Influenza, High Dose Seasonal PF 02/03/2018   Influenza,inj,Quad PF,6+ Mos 02/22/2016   Influenza-Unspecified 10/07/2012, 04/26/2014   Pneumococcal Conjugate-13 02/03/2018   Pneumococcal Polysaccharide-23 02/22/2016    Health Maintenance  Topic Date Due   COVID-19 Vaccine (1) Never done   Hepatitis C Screening  Never done   DTaP/Tdap/Td (1 - Tdap) Never done   Zoster Vaccines- Shingrix (1 of 2) Never done   DEXA SCAN  Never done   Medicare Annual Wellness (AWV)  07/12/2022   INFLUENZA VACCINE  09/26/2022   Lung Cancer Screening  05/13/2023   Pneumonia Vaccine 53+ Years old  Completed   HPV VACCINES  Aged Out     Discussed health benefits of physical activity, and encouraged her to engage in regular exercise appropriate for her age and condition.  Miki Kins, FNP   01/02/2023  This document may have been prepared by Dragon Voice Recognition software and as such may include unintentional dictation errors.

## 2023-01-06 DIAGNOSIS — H2512 Age-related nuclear cataract, left eye: Secondary | ICD-10-CM | POA: Diagnosis not present

## 2023-01-06 DIAGNOSIS — H3500 Unspecified background retinopathy: Secondary | ICD-10-CM | POA: Diagnosis not present

## 2023-01-06 DIAGNOSIS — H2511 Age-related nuclear cataract, right eye: Secondary | ICD-10-CM | POA: Diagnosis not present

## 2023-01-06 DIAGNOSIS — H538 Other visual disturbances: Secondary | ICD-10-CM | POA: Diagnosis not present

## 2023-01-27 DIAGNOSIS — H353211 Exudative age-related macular degeneration, right eye, with active choroidal neovascularization: Secondary | ICD-10-CM | POA: Diagnosis not present

## 2023-01-29 DIAGNOSIS — H2511 Age-related nuclear cataract, right eye: Secondary | ICD-10-CM | POA: Diagnosis not present

## 2023-01-31 ENCOUNTER — Encounter: Payer: Self-pay | Admitting: Ophthalmology

## 2023-02-04 NOTE — Discharge Instructions (Signed)

## 2023-02-05 NOTE — Anesthesia Preprocedure Evaluation (Addendum)
Anesthesia Evaluation  Patient identified by MRN, date of birth, ID band Patient awake    Reviewed: Allergy & Precautions, H&P , NPO status , Patient's Chart, lab work & pertinent test results  Airway Mallampati: III  TM Distance: <3 FB Neck ROM: Full    Dental no notable dental hx. (+) Upper Dentures, Edentulous Lower   Pulmonary neg pulmonary ROS, pneumonia, COPD, Current Smoker and Patient abstained from smoking.   Pulmonary exam normal breath sounds clear to auscultation       Cardiovascular negative cardio ROS Normal cardiovascular exam Rhythm:Regular Rate:Normal     Neuro/Psych  PSYCHIATRIC DISORDERS Anxiety Depression    negative neurological ROS  negative psych ROS   GI/Hepatic negative GI ROS, Neg liver ROS,GERD  ,,  Endo/Other  negative endocrine ROS    Renal/GU negative Renal ROS  negative genitourinary   Musculoskeletal negative musculoskeletal ROS (+)    Abdominal   Peds negative pediatric ROS (+)  Hematology negative hematology ROS (+)   Anesthesia Other Findings Hypokalemia  Narcolepsy ADD (attention deficit disorder) Anxiety with panic attacks Pneumonia  Hemorrhoid Panic attacks   Note "cold sore" left side mouth, patient repetitively touching that, asked her not to do that so it doesn't transfer to her eye, she said, "that's hard to remember." Notified. Dr. Rolley Sims   Vital signs today: oxygen saturation 93%, temp 98.1, heart rate 94, respirations 12, BP 121/77  Hx Community acquired pneumonia Hx Acute respiratory failure with hypoxia Hypokalemia Hx Wheezing  Hx Right middle lobe pneumonia Hx Upper respiratory tract infection   NOTE: due to narcolepsy, would consider precedex, as fentanyl and versed may precipitate or worsen narcolepsy.    Patient also has hx anxiety and panic attacks. Patient took alprazolam 1 mg and methylpheidate (ritalin) 20 mg today.     Reproductive/Obstetrics negative OB ROS                             Anesthesia Physical Anesthesia Plan  ASA: 3  Anesthesia Plan: MAC   Post-op Pain Management:    Induction: Intravenous  PONV Risk Score and Plan:   Airway Management Planned: Natural Airway and Nasal Cannula  Additional Equipment:   Intra-op Plan:   Post-operative Plan:   Informed Consent: I have reviewed the patients History and Physical, chart, labs and discussed the procedure including the risks, benefits and alternatives for the proposed anesthesia with the patient or authorized representative who has indicated his/her understanding and acceptance.     Dental Advisory Given  Plan Discussed with: Anesthesiologist, CRNA and Surgeon  Anesthesia Plan Comments: (Patient consented for risks of anesthesia including but not limited to:  - adverse reactions to medications - damage to eyes, teeth, lips or other oral mucosa - nerve damage due to positioning  - sore throat or hoarseness - Damage to heart, brain, nerves, lungs, other parts of body or loss of life  Patient voiced understanding and assent.)       Anesthesia Quick Evaluation

## 2023-02-06 ENCOUNTER — Ambulatory Visit
Admission: RE | Admit: 2023-02-06 | Discharge: 2023-02-06 | Disposition: A | Discharge status: Home / Self Care | Payer: 59 | Attending: Ophthalmology | Admitting: Ophthalmology

## 2023-02-06 ENCOUNTER — Encounter
Admission: RE | Disposition: A | Discharge status: Home / Self Care | Payer: Self-pay | Source: Home / Self Care | Attending: Ophthalmology

## 2023-02-06 ENCOUNTER — Ambulatory Visit: Payer: 59 | Admitting: Anesthesiology

## 2023-02-06 ENCOUNTER — Other Ambulatory Visit: Payer: Self-pay

## 2023-02-06 ENCOUNTER — Encounter: Payer: Self-pay | Admitting: Ophthalmology

## 2023-02-06 DIAGNOSIS — H2511 Age-related nuclear cataract, right eye: Secondary | ICD-10-CM | POA: Insufficient documentation

## 2023-02-06 DIAGNOSIS — J449 Chronic obstructive pulmonary disease, unspecified: Secondary | ICD-10-CM | POA: Diagnosis not present

## 2023-02-06 DIAGNOSIS — F419 Anxiety disorder, unspecified: Secondary | ICD-10-CM | POA: Diagnosis not present

## 2023-02-06 DIAGNOSIS — F32A Depression, unspecified: Secondary | ICD-10-CM | POA: Diagnosis not present

## 2023-02-06 DIAGNOSIS — K219 Gastro-esophageal reflux disease without esophagitis: Secondary | ICD-10-CM | POA: Insufficient documentation

## 2023-02-06 DIAGNOSIS — F17210 Nicotine dependence, cigarettes, uncomplicated: Secondary | ICD-10-CM | POA: Insufficient documentation

## 2023-02-06 DIAGNOSIS — F17200 Nicotine dependence, unspecified, uncomplicated: Secondary | ICD-10-CM | POA: Diagnosis not present

## 2023-02-06 DIAGNOSIS — Z79899 Other long term (current) drug therapy: Secondary | ICD-10-CM | POA: Insufficient documentation

## 2023-02-06 HISTORY — PX: CATARACT EXTRACTION W/PHACO: SHX586

## 2023-02-06 SURGERY — PHACOEMULSIFICATION, CATARACT, WITH IOL INSERTION
Anesthesia: Monitor Anesthesia Care | Laterality: Right

## 2023-02-06 MED ORDER — TETRACAINE HCL 0.5 % OP SOLN
OPHTHALMIC | Status: AC
  Filled 2023-02-06: qty 4

## 2023-02-06 MED ORDER — ARMC OPHTHALMIC DILATING DROPS
1.0000 | OPHTHALMIC | Status: DC | PRN
  Administered 2023-02-06 (×3): 1 via OPHTHALMIC

## 2023-02-06 MED ORDER — TETRACAINE HCL 0.5 % OP SOLN
1.0000 [drp] | OPHTHALMIC | Status: DC | PRN
  Administered 2023-02-06 (×3): 1 [drp] via OPHTHALMIC

## 2023-02-06 MED ORDER — LIDOCAINE HCL (PF) 2 % IJ SOLN
INTRAOCULAR | Status: DC | PRN
  Administered 2023-02-06: 4 mL via INTRAOCULAR

## 2023-02-06 MED ORDER — DEXMEDETOMIDINE HCL IN NACL 80 MCG/20ML IV SOLN
INTRAVENOUS | Status: DC | PRN
  Administered 2023-02-06 (×2): 4 ug via INTRAVENOUS

## 2023-02-06 MED ORDER — BRIMONIDINE TARTRATE-TIMOLOL 0.2-0.5 % OP SOLN
OPHTHALMIC | Status: DC | PRN
  Administered 2023-02-06: 1 [drp] via OPHTHALMIC

## 2023-02-06 MED ORDER — MOXIFLOXACIN HCL 0.5 % OP SOLN
OPHTHALMIC | Status: DC | PRN
  Administered 2023-02-06: 1 [drp] via OPHTHALMIC

## 2023-02-06 MED ORDER — SIGHTPATH DOSE#1 BSS IO SOLN
INTRAOCULAR | Status: DC | PRN
  Administered 2023-02-06: 15 mL via INTRAOCULAR

## 2023-02-06 MED ORDER — SIGHTPATH DOSE#1 NA HYALUR & NA CHOND-NA HYALUR IO KIT
PACK | INTRAOCULAR | Status: DC | PRN
  Administered 2023-02-06: 1 via OPHTHALMIC

## 2023-02-06 MED ORDER — SIGHTPATH DOSE#1 BSS IO SOLN
INTRAOCULAR | Status: DC | PRN
  Administered 2023-02-06: 95 mL via OPHTHALMIC

## 2023-02-06 SURGICAL SUPPLY — 10 items
CATARACT SUITE SIGHTPATH (MISCELLANEOUS) ×1
DISSECTOR HYDRO NUCLEUS 50X22 (MISCELLANEOUS) ×1 IMPLANT
DRSG TEGADERM 2-3/8X2-3/4 SM (GAUZE/BANDAGES/DRESSINGS) ×1 IMPLANT
FEE CATARACT SUITE SIGHTPATH (MISCELLANEOUS) ×1 IMPLANT
GLOVE SURG SYN 7.5 E (GLOVE) ×1
GLOVE SURG SYN 7.5 PF PI (GLOVE) ×1 IMPLANT
GLOVE SURG SYN 8.5 E (GLOVE) ×1
GLOVE SURG SYN 8.5 PF PI (GLOVE) ×1 IMPLANT
LENS IOL RAYNER 26.5 (Intraocular Lens) ×1 IMPLANT
LENS IOL RAYONE EMV 26.5 (Intraocular Lens) IMPLANT

## 2023-02-06 NOTE — H&P (Signed)
[facility]   Primary Care Physician:  Miki Kins, FNP Ophthalmologist: Dr. Deberah Pelton  Pre-Procedure History & Physical: HPI:  ARIYAH Stout is a 77 y.o. female here for cataract surgery.   Past Medical History:  Diagnosis Date   Acute respiratory failure with hypoxia (HCC) 10/06/2018   ADD (attention deficit disorder)    Anxiety    Community acquired pneumonia 10/06/2018   Hemorrhoid    Hypokalemia    Hypokalemia 03/08/2021   Narcolepsy    Panic attacks    Pneumonia    Right middle lobe pneumonia 03/07/2021   Upper respiratory tract infection 03/02/2021   Wheezing 03/02/2021    Past Surgical History:  Procedure Laterality Date   TUBAL LIGATION      Prior to Admission medications   Medication Sig Start Date End Date Taking? Authorizing Provider  ALPRAZolam Prudy Feeler) 1 MG tablet Take 1 mg by mouth 2 (two) times daily as needed for anxiety.   Yes [provider]  busPIRone (BUSPAR) 15 MG tablet Take 15 mg by mouth 3 (three) times daily.   Yes [provider]  cholecalciferol (VITAMIN D3) 25 MCG (1000 UNIT) tablet Take 1,000 Units by mouth daily.   Yes [provider]  co-enzyme Q-10 50 MG capsule Take 50 mg by mouth daily.   Yes [provider]  lidocaine (LIDODERM) 5 % Place 1 patch onto the skin every 12 (twelve) hours. Remove & Discard patch within 12 hours or as directed by MD 04/16/22  Yes Sharman Cheek, MD  methylphenidate (RITALIN) 20 MG tablet Take 3-4 tablets by mouth. Per day in divided doses 06/08/22  Yes [provider]  Omega-3 Fatty Acids (FISH OIL) 1000 MG CAPS Take by mouth.   Yes [provider]  traZODone (DESYREL) 150 MG tablet Take 150 mg by mouth at bedtime.   Yes [provider]  Vitamins-Lipotropics (BALANCED B-50) TABS Take by mouth.   Yes [provider]  mirtazapine (REMERON) 30 MG tablet Take 15 mg by mouth at bedtime. Patient not taking: Reported on 01/31/2023  05/30/21   [provider]    Allergies as of 01/16/2023 - Review Complete 01/02/2023  Allergen Reaction Noted   Baclofen  04/12/2022    Family History  Problem Relation Age of Onset   Lung disease Mother    Thyroid disease Sister    Lung disease Sister    Cancer Sister        Bladder   Heart disease Father    Breast cancer Neg Hx     Social History   Socioeconomic History   Marital status: Married    Spouse name: Not on file   Number of children: Not on file   Years of education: Not on file   Highest education level: Not on file  Occupational History   Occupation: retired  Tobacco Use   Smoking status: Every Day    Current packs/day: 0.50    Types: Cigarettes    Passive exposure: Past   Smokeless tobacco: Never  Vaping Use   Vaping status: Never Used  Substance and Sexual Activity   Alcohol use: No   Drug use: No   Sexual activity: Not Currently    Birth control/protection: Post-menopausal, Surgical  Other Topics Concern   Not on file  Social History Narrative   Not on file   Social Drivers of Health   Financial Resource Strain: Low Risk  (07/11/2021)   Overall Financial Resource Strain (CARDIA)  Difficulty of Paying Living Expenses: Not hard at all  Food Insecurity: No Food Insecurity (07/11/2021)   Hunger Vital Sign    Worried About Running Out of Food in the Last Year: Never true    Ran Out of Food in the Last Year: Never true  Transportation Needs: No Transportation Needs (10/01/2021)   PRAPARE - Administrator, Civil Service (Medical): No    Lack of Transportation (Non-Medical): No  Recent Concern: Transportation Needs - Unmet Transportation Needs (07/11/2021)   PRAPARE - Transportation    Lack of Transportation (Medical): Yes    Lack of Transportation (Non-Medical): Yes  Physical Activity: Inactive (07/11/2021)   Exercise Vital Sign    Days of Exercise per Week: 0 days    Minutes of Exercise per Session: 0 min  Stress: Stress  Concern Present (07/11/2021)   [employer] of Occupational Health - Occupational Stress Questionnaire    Feeling of Stress : To some extent  Social Connections: Socially Isolated (07/11/2021)   Social Connection and Isolation Panel [NHANES]    Frequency of Communication with Friends and Family: More than three times a week    Frequency of Social Gatherings with Friends and Family: Once a week    Attends Religious Services: Never    Database administrator or Organizations: No    Attends Banker Meetings: Never    Marital Status: Widowed  Intimate Partner Violence: Not At Risk (07/11/2021)   Humiliation, Afraid, Rape, and Kick questionnaire    Fear of Current or Ex-Partner: No    Emotionally Abused: No    Physically Abused: No    Sexually Abused: No    Review of Systems: See HPI, otherwise negative ROS  Physical Exam: Ht 5\' 2"  (1.575 m)   Wt 45.4 kg   LMP  (LMP Unknown)   BMI 18.29 kg/m  General:   Alert, cooperative in NAD Head:  Normocephalic and atraumatic. Respiratory:  Normal work of breathing. Cardiovascular:  RRR  Impression/Plan: Sandra Stout is here for cataract surgery.  Risks, benefits, limitations, and alternatives regarding cataract surgery have been reviewed with the patient.  Questions have been answered.  All parties agreeable.   Estanislado Pandy, MD  02/06/2023, 7:05 AM

## 2023-02-06 NOTE — Transfer of Care (Signed)
Immediate Anesthesia Transfer of Care Note  Patient: Sandra Stout  Procedure(s) Performed: CATARACT EXTRACTION PHACO AND INTRAOCULAR LENS PLACEMENT (IOC) RIGHT  15.38 01:21.3 (Right)  Patient Location: PACU  Anesthesia Type:MAC  Level of Consciousness: awake, alert , and oriented  Airway & Oxygen Therapy: Patient Spontanous Breathing  Post-op Assessment: Report given to RN, Post -op Vital signs reviewed and stable, and Patient moving all extremities X 4  Post vital signs: Reviewed and stable  Last Vitals:  Vitals Value Taken Time  BP    Temp    Pulse    Resp    SpO2      Last Pain:  Vitals:   02/06/23 1021  TempSrc: Temporal  PainSc: 6          Complications: No notable events documented.

## 2023-02-06 NOTE — Op Note (Signed)
OPERATIVE NOTE  Sandra Stout [id number] 02/06/2023   PREOPERATIVE DIAGNOSIS: Nuclear sclerotic cataract right eye. H25.11   POSTOPERATIVE DIAGNOSIS: Nuclear sclerotic cataract right eye. H25.11   PROCEDURE:  Phacoemusification with posterior chamber intraocular lens placement of the right eye  Ultrasound time: Procedure(s): CATARACT EXTRACTION PHACO AND INTRAOCULAR LENS PLACEMENT (IOC) RIGHT  15.38 01:21.3 (Right)  LENS:   Implant Name Type Inv. Item Serial No. Manufacturer Lot No. LRB No. Used Action  LENS IOL RAYNER 26.5 - [id number] Intraocular Lens LENS IOL RAYNER 26.5 [id number] SIGHT[employer] [id number] Right 1 Implanted      SURGEON:  Julious Payer. Rolley Sims, MD   ANESTHESIA:  Topical with tetracaine drops, augmented with 1% preservative-free intracameral lidocaine.   COMPLICATIONS:  None.   DESCRIPTION OF PROCEDURE:  The patient was identified in the holding room and transported to the operating room and placed in the supine position under the operating microscope.  The right eye was identified as the operative eye, which was prepped and draped in the usual sterile ophthalmic fashion.   A 1 millimeter clear-corneal paracentesis was made superotemporally. Preservative-free 1% lidocaine mixed with 1:1,000 bisulfite-free aqueous solution of epinephrine was injected into the anterior chamber. The anterior chamber was then filled with Viscoat viscoelastic. A 2.4 millimeter keratome was used to make a clear-corneal incision inferotemporally. A curvilinear capsulorrhexis was made with a cystotome and capsulorrhexis forceps. Balanced salt solution was used to hydrodissect and hydrodelineate the nucleus. Phacoemulsification was then used to remove the lens nucleus and epinucleus. The remaining cortex was then removed using the irrigation and aspiration handpiece. Provisc was then placed into the capsular bag to distend it for lens placement. A +26.50 D [id number] intraocular lens was then injected into the  capsular bag. The remaining viscoelastic was aspirated.   Wounds were hydrated with balanced salt solution.  The anterior chamber was inflated to a physiologic pressure with balanced salt solution.  No wound leaks were noted. Vigamox was injected intracamerally.  Timolol and Brimonidine drops were applied to the eye.  The patient was taken to the recovery room in stable condition without complications of anesthesia or surgery.  [name] 02/06/2023, 11:49 AM

## 2023-02-07 ENCOUNTER — Encounter: Payer: Self-pay | Admitting: Ophthalmology

## 2023-02-07 DIAGNOSIS — H2512 Age-related nuclear cataract, left eye: Secondary | ICD-10-CM | POA: Diagnosis not present

## 2023-02-07 NOTE — Anesthesia Postprocedure Evaluation (Signed)
Anesthesia Post Note  Patient: Sandra Stout  Procedure(s) Performed: CATARACT EXTRACTION PHACO AND INTRAOCULAR LENS PLACEMENT (IOC) RIGHT  15.38 01:21.3 (Right)  Patient location during evaluation: PACU Anesthesia Type: MAC Level of consciousness: awake and alert Pain management: pain level controlled Vital Signs Assessment: post-procedure vital signs reviewed and stable Respiratory status: spontaneous breathing, nonlabored ventilation, respiratory function stable and patient connected to nasal cannula oxygen Cardiovascular status: stable and blood pressure returned to baseline Postop Assessment: no apparent nausea or vomiting Anesthetic complications: no   No notable events documented.   Last Vitals:  Vitals:   02/06/23 1151 02/06/23 1157  BP: 138/79 134/71  Pulse: 89   Resp: 20   Temp:  (!) 36.2 C  SpO2:      Last Pain:  Vitals:   02/07/23 1007  TempSrc:   PainSc: 0-No pain                 [name] C [name]

## 2023-02-10 NOTE — Anesthesia Preprocedure Evaluation (Addendum)
Anesthesia Evaluation  Patient identified by MRN, date of birth, ID band Patient awake    Reviewed: Allergy & Precautions, H&P , NPO status , Patient's Chart, lab work & pertinent test results  Airway Mallampati: III  TM Distance: <3 FB Neck ROM: Full    Dental no notable dental hx. (+) Upper Dentures, Edentulous Lower   Pulmonary neg pulmonary ROS, pneumonia, COPD, Current Smoker and Patient abstained from smoking.   Pulmonary exam normal breath sounds clear to auscultation       Cardiovascular negative cardio ROS Normal cardiovascular exam Rhythm:Regular Rate:Normal     Neuro/Psych  PSYCHIATRIC DISORDERS Anxiety Depression    negative neurological ROS  negative psych ROS   GI/Hepatic negative GI ROS, Neg liver ROS,GERD  ,,  Endo/Other  negative endocrine ROS    Renal/GU negative Renal ROS  negative genitourinary   Musculoskeletal negative musculoskeletal ROS (+)    Abdominal   Peds negative pediatric ROS (+)  Hematology negative hematology ROS (+)   Anesthesia Other Findings Previous cataract surgery 02-06-23 Due to narcolepsy, patient had only precedex 8 mcg previous surgery and did well with that.   Hypokalemia  Narcolepsy ADD (attention deficit disorder) Anxiety Pneumonia  Hemorrhoid Panic attacks  Community acquired pneumonia Acute respiratory failure with hypoxia (HCC) Hypokalemia Wheezing  Right middle lobe pneumonia Upper respiratory tract infection     Reproductive/Obstetrics negative OB ROS                             Anesthesia Physical Anesthesia Plan  ASA: 3  Anesthesia Plan: MAC   Post-op Pain Management:    Induction: Intravenous  PONV Risk Score and Plan:   Airway Management Planned: Natural Airway and Nasal Cannula  Additional Equipment:   Intra-op Plan:   Post-operative Plan:   Informed Consent: I have reviewed the patients History and  Physical, chart, labs and discussed the procedure including the risks, benefits and alternatives for the proposed anesthesia with the patient or authorized representative who has indicated his/her understanding and acceptance.     Dental Advisory Given  Plan Discussed with: Anesthesiologist, CRNA and Surgeon  Anesthesia Plan Comments: (Patient consented for risks of anesthesia including but not limited to:  - adverse reactions to medications - damage to eyes, teeth, lips or other oral mucosa - nerve damage due to positioning  - sore throat or hoarseness - Damage to heart, brain, nerves, lungs, other parts of body or loss of life  Patient voiced understanding and assent.)        Anesthesia Quick Evaluation

## 2023-02-12 NOTE — Discharge Instructions (Signed)

## 2023-02-20 ENCOUNTER — Encounter: Payer: Self-pay | Admitting: Ophthalmology

## 2023-02-20 ENCOUNTER — Ambulatory Visit: Payer: 59 | Admitting: Anesthesiology

## 2023-02-20 ENCOUNTER — Ambulatory Visit
Admission: RE | Admit: 2023-02-20 | Discharge: 2023-02-20 | Disposition: A | Discharge status: Home / Self Care | Payer: 59 | Attending: Ophthalmology | Admitting: Ophthalmology

## 2023-02-20 ENCOUNTER — Encounter
Admission: RE | Disposition: A | Discharge status: Home / Self Care | Payer: Self-pay | Source: Home / Self Care | Attending: Ophthalmology

## 2023-02-20 ENCOUNTER — Other Ambulatory Visit: Payer: Self-pay

## 2023-02-20 DIAGNOSIS — F17210 Nicotine dependence, cigarettes, uncomplicated: Secondary | ICD-10-CM | POA: Insufficient documentation

## 2023-02-20 DIAGNOSIS — F32A Depression, unspecified: Secondary | ICD-10-CM | POA: Diagnosis not present

## 2023-02-20 DIAGNOSIS — J449 Chronic obstructive pulmonary disease, unspecified: Secondary | ICD-10-CM | POA: Insufficient documentation

## 2023-02-20 DIAGNOSIS — F419 Anxiety disorder, unspecified: Secondary | ICD-10-CM | POA: Insufficient documentation

## 2023-02-20 DIAGNOSIS — H269 Unspecified cataract: Secondary | ICD-10-CM | POA: Diagnosis not present

## 2023-02-20 DIAGNOSIS — F17200 Nicotine dependence, unspecified, uncomplicated: Secondary | ICD-10-CM | POA: Diagnosis not present

## 2023-02-20 DIAGNOSIS — H2512 Age-related nuclear cataract, left eye: Secondary | ICD-10-CM | POA: Diagnosis not present

## 2023-02-20 HISTORY — PX: CATARACT EXTRACTION W/PHACO: SHX586

## 2023-02-20 SURGERY — PHACOEMULSIFICATION, CATARACT, WITH IOL INSERTION
Anesthesia: Monitor Anesthesia Care | Laterality: Left

## 2023-02-20 MED ORDER — SIGHTPATH DOSE#1 NA HYALUR & NA CHOND-NA HYALUR IO KIT
PACK | INTRAOCULAR | Status: DC | PRN
  Administered 2023-02-20: 1 via OPHTHALMIC

## 2023-02-20 MED ORDER — DEXMEDETOMIDINE HCL IN NACL 80 MCG/20ML IV SOLN
INTRAVENOUS | Status: DC | PRN
  Administered 2023-02-20: 4 ug via INTRAVENOUS

## 2023-02-20 MED ORDER — TETRACAINE HCL 0.5 % OP SOLN
1.0000 [drp] | OPHTHALMIC | Status: DC | PRN
  Administered 2023-02-20 (×3): 1 [drp] via OPHTHALMIC

## 2023-02-20 MED ORDER — MOXIFLOXACIN HCL 0.5 % OP SOLN
OPHTHALMIC | Status: DC | PRN
  Administered 2023-02-20: .2 mL via OPHTHALMIC

## 2023-02-20 MED ORDER — BRIMONIDINE TARTRATE-TIMOLOL 0.2-0.5 % OP SOLN
OPHTHALMIC | Status: DC | PRN
  Administered 2023-02-20: 1 [drp] via OPHTHALMIC

## 2023-02-20 MED ORDER — SIGHTPATH DOSE#1 BSS IO SOLN
INTRAOCULAR | Status: DC | PRN
  Administered 2023-02-20: 15 mL via INTRAOCULAR

## 2023-02-20 MED ORDER — ARMC OPHTHALMIC DILATING DROPS
OPHTHALMIC | Status: AC
  Filled 2023-02-20: qty 0.5

## 2023-02-20 MED ORDER — ARMC OPHTHALMIC DILATING DROPS
1.0000 | OPHTHALMIC | Status: DC | PRN
  Administered 2023-02-20 (×3): 1 via OPHTHALMIC

## 2023-02-20 MED ORDER — SIGHTPATH DOSE#1 BSS IO SOLN
INTRAOCULAR | Status: DC | PRN
  Administered 2023-02-20: 98 mL via OPHTHALMIC

## 2023-02-20 MED ORDER — TETRACAINE HCL 0.5 % OP SOLN
OPHTHALMIC | Status: AC
  Filled 2023-02-20: qty 4

## 2023-02-20 MED ORDER — DEXMEDETOMIDINE HCL IN NACL 80 MCG/20ML IV SOLN
INTRAVENOUS | Status: AC
Start: 2023-02-20 — End: ?
  Filled 2023-02-20: qty 20

## 2023-02-20 MED ORDER — LIDOCAINE HCL (PF) 2 % IJ SOLN
INTRAOCULAR | Status: DC | PRN
  Administered 2023-02-20: 1 mL via INTRAOCULAR

## 2023-02-20 SURGICAL SUPPLY — 10 items
CATARACT SUITE SIGHTPATH (MISCELLANEOUS) ×1 IMPLANT
DISSECTOR HYDRO NUCLEUS 50X22 (MISCELLANEOUS) ×1 IMPLANT
DRSG TEGADERM 2-3/8X2-3/4 SM (GAUZE/BANDAGES/DRESSINGS) ×1 IMPLANT
FEE CATARACT SUITE SIGHTPATH (MISCELLANEOUS) ×1 IMPLANT
GLOVE SURG SYN 7.5 E (GLOVE) ×1 IMPLANT
GLOVE SURG SYN 7.5 PF PI (GLOVE) ×1 IMPLANT
GLOVE SURG SYN 8.5 E (GLOVE) ×1 IMPLANT
GLOVE SURG SYN 8.5 PF PI (GLOVE) ×1 IMPLANT
LENS IOL RAYNER 28.0 (Intraocular Lens) ×1 IMPLANT
LENS IOL RAYONE EMV 28.0 (Intraocular Lens) IMPLANT

## 2023-02-20 NOTE — Transfer of Care (Signed)
Immediate Anesthesia Transfer of Care Note  Patient: Sandra Stout  Procedure(s) Performed: CATARACT EXTRACTION PHACO AND INTRAOCULAR LENS PLACEMENT (IOC) LEFT VISION BLUE 24.75 02:02.3 (Left)  Patient Location: PACU  Anesthesia Type: MAC  Level of Consciousness: awake, alert  and patient cooperative  Airway and Oxygen Therapy: Patient Spontanous Breathing and Patient connected to supplemental oxygen  Post-op Assessment: Post-op Vital signs reviewed, Patient's Cardiovascular Status Stable, Respiratory Function Stable, Patent Airway and No signs of Nausea or vomiting  Post-op Vital Signs: Reviewed and stable  Complications: No notable events documented.

## 2023-02-20 NOTE — Anesthesia Postprocedure Evaluation (Signed)
Anesthesia Post Note  Patient: LYSHA SCHER  Procedure(s) Performed: CATARACT EXTRACTION PHACO AND INTRAOCULAR LENS PLACEMENT (IOC) LEFT VISION BLUE 24.75 02:02.3 (Left)  Patient location during evaluation: PACU Anesthesia Type: MAC Level of consciousness: awake and alert Pain management: pain level controlled Vital Signs Assessment: post-procedure vital signs reviewed and stable Respiratory status: spontaneous breathing, nonlabored ventilation, respiratory function stable and patient connected to nasal cannula oxygen Cardiovascular status: stable and blood pressure returned to baseline Postop Assessment: no apparent nausea or vomiting Anesthetic complications: no   No notable events documented.   Last Vitals:  Vitals:   02/20/23 1131 02/20/23 1137  BP: (!) 109/58 (!) 90/57  Pulse: 78 84  Resp: 14 20  Temp: (!) 36.2 C (!) 36.2 C  SpO2: 98% 98%    Last Pain:  Vitals:   02/20/23 1137  TempSrc:   PainSc: 0-No pain                 Shaelin Lalley C Parrish Daddario

## 2023-02-20 NOTE — H&P (Signed)
[facility]   Primary Care Physician:  Miki Kins, FNP Ophthalmologist: Dr. Deberah Pelton  Pre-Procedure History & Physical: HPI:  Sandra Stout is a 77 y.o. female here for cataract surgery.   Past Medical History:  Diagnosis Date   Acute respiratory failure with hypoxia (HCC) 10/06/2018   ADD (attention deficit disorder)    Anxiety    Community acquired pneumonia 10/06/2018   Hemorrhoid    Hypokalemia    Hypokalemia 03/08/2021   Narcolepsy    Panic attacks    Pneumonia    Right middle lobe pneumonia 03/07/2021   Upper respiratory tract infection 03/02/2021   Wheezing 03/02/2021    Past Surgical History:  Procedure Laterality Date   CATARACT EXTRACTION W/PHACO Right 02/06/2023   Procedure: CATARACT EXTRACTION PHACO AND INTRAOCULAR LENS PLACEMENT (IOC) RIGHT  15.38 01:21.3;  Surgeon: Estanislado Pandy, MD;  Location: [facility];  Service: Ophthalmology;  Laterality: Right;   TUBAL LIGATION      Prior to Admission medications   Medication Sig Start Date End Date Taking? Authorizing Provider  ALPRAZolam Prudy Feeler) 1 MG tablet Take 1 mg by mouth 2 (two) times daily as needed for anxiety.    [provider]  busPIRone (BUSPAR) 15 MG tablet Take 15 mg by mouth 3 (three) times daily.    [provider]  cholecalciferol (VITAMIN D3) 25 MCG (1000 UNIT) tablet Take 1,000 Units by mouth daily.    [provider]  co-enzyme Q-10 50 MG capsule Take 50 mg by mouth daily.    [provider]  lidocaine (LIDODERM) 5 % Place 1 patch onto the skin every 12 (twelve) hours. Remove & Discard patch within 12 hours or as directed by MD 04/16/22   Sharman Cheek, MD  methylphenidate (RITALIN) 20 MG tablet Take 3-4 tablets by mouth. Per day in divided doses 06/08/22   [provider]  mirtazapine (REMERON) 30 MG tablet Take 15 mg by mouth at bedtime. Patient not taking: Reported on 01/31/2023 05/30/21   [provider]   Omega-3 Fatty Acids (FISH OIL) 1000 MG CAPS Take by mouth.    [provider]  traZODone (DESYREL) 150 MG tablet Take 150 mg by mouth at bedtime.    [provider]  Vitamins-Lipotropics (BALANCED B-50) TABS Take by mouth.    [provider]    Allergies as of 01/16/2023 - Review Complete 01/02/2023  Allergen Reaction Noted   Baclofen  04/12/2022    Family History  Problem Relation Age of Onset   Lung disease Mother    Thyroid disease Sister    Lung disease Sister    Cancer Sister        Bladder   Heart disease Father    Breast cancer Neg Hx     Social History   Socioeconomic History   Marital status: Married    Spouse name: Not on file   Number of children: Not on file   Years of education: Not on file   Highest education level: Not on file  Occupational History   Occupation: retired  Tobacco Use   Smoking status: Every Day    Current packs/day: 0.50    Types: Cigarettes    Passive exposure: Past   Smokeless tobacco: Never  Vaping Use   Vaping status: Never Used  Substance and Sexual Activity   Alcohol use: No   Drug use: No   Sexual activity: Not Currently    Birth control/protection: Post-menopausal, Surgical  Other  Topics Concern   Not on file  Social History Narrative   Not on file   Social Drivers of Health   Financial Resource Strain: Low Risk  (07/11/2021)   Overall Financial Resource Strain (CARDIA)    Difficulty of Paying Living Expenses: Not hard at all  Food Insecurity: No Food Insecurity (07/11/2021)   Hunger Vital Sign    Worried About Running Out of Food in the Last Year: Never true    Ran Out of Food in the Last Year: Never true  Transportation Needs: No Transportation Needs (10/01/2021)   PRAPARE - Administrator, Civil Service (Medical): No    Lack of Transportation (Non-Medical): No  Recent Concern: Transportation Needs - Unmet Transportation Needs (07/11/2021)   PRAPARE - Transportation    Lack of  Transportation (Medical): Yes    Lack of Transportation (Non-Medical): Yes  Physical Activity: Inactive (07/11/2021)   Exercise Vital Sign    Days of Exercise per Week: 0 days    Minutes of Exercise per Session: 0 min  Stress: Stress Concern Present (07/11/2021)   [employer] of Occupational Health - Occupational Stress Questionnaire    Feeling of Stress : To some extent  Social Connections: Socially Isolated (07/11/2021)   Social Connection and Isolation Panel [NHANES]    Frequency of Communication with Friends and Family: More than three times a week    Frequency of Social Gatherings with Friends and Family: Once a week    Attends Religious Services: Never    Database administrator or Organizations: No    Attends Banker Meetings: Never    Marital Status: Widowed  Intimate Partner Violence: Not At Risk (07/11/2021)   Humiliation, Afraid, Rape, and Kick questionnaire    Fear of Current or Ex-Partner: No    Emotionally Abused: No    Physically Abused: No    Sexually Abused: No    Review of Systems: See HPI, otherwise negative ROS  Physical Exam: LMP  (LMP Unknown)  General:   Alert, cooperative in NAD Head:  Normocephalic and atraumatic. Respiratory:  Normal work of breathing. Cardiovascular:  RRR  Impression/Plan: Sandra Stout is here for cataract surgery.  Risks, benefits, limitations, and alternatives regarding cataract surgery have been reviewed with the patient.  Questions have been answered.  All parties agreeable.   Estanislado Pandy, MD  02/20/2023, 9:03 AM

## 2023-02-20 NOTE — Op Note (Signed)
OPERATIVE NOTE  Sandra Stout [id number] 02/20/2023   PREOPERATIVE DIAGNOSIS: Nuclear sclerotic cataract left eye. H25.12   POSTOPERATIVE DIAGNOSIS: Nuclear sclerotic cataract left eye. H25.12   PROCEDURE:  Phacoemusification with posterior chamber intraocular lens placement of the left eye  Ultrasound time: Procedure(s): CATARACT EXTRACTION PHACO AND INTRAOCULAR LENS PLACEMENT (IOC) LEFT VISION BLUE 24.75 02:02.3 (Left)  LENS:   Implant Name Type Inv. Item Serial No. Manufacturer Lot No. LRB No. Used Action  LENS IOL RAYNER 28.0 - [id number] Intraocular Lens LENS IOL RAYNER 28.0  [employer] [id number] Left 1 Implanted      SURGEON:  Julious Payer. Rolley Sims, MD   ANESTHESIA:  Topical with tetracaine drops, augmented with 1% preservative-free intracameral lidocaine.   COMPLICATIONS:  None.   DESCRIPTION OF PROCEDURE:  The patient was identified in the holding room and transported to the operating room and placed in the supine position under the operating microscope.  The left eye was identified as the operative eye, which was prepped and draped in the usual sterile ophthalmic fashion.   A 1 millimeter clear-corneal paracentesis was made inferotemporally. Preservative-free 1% lidocaine mixed with 1:1,000 bisulfite-free aqueous solution of epinephrine was injected into the anterior chamber. The anterior chamber was then filled with Viscoat viscoelastic. A 2.4 millimeter keratome was used to make a clear-corneal incision superotemporally. A curvilinear capsulorrhexis was made with a cystotome and capsulorrhexis forceps. Balanced salt solution was used to hydrodissect and hydrodelineate the nucleus. Phacoemulsification was then used to remove the lens nucleus and epinucleus. The remaining cortex was then removed using the irrigation and aspiration handpiece. Provisc was then placed into the capsular bag to distend it for lens placement. A +28.00 D [id number] intraocular lens was then injected into the  capsular bag. The remaining viscoelastic was aspirated.   Wounds were hydrated with balanced salt solution.  The anterior chamber was inflated to a physiologic pressure with balanced salt solution.  No wound leaks were noted. Vigamox was injected intracamerally.  Timolol and Brimonidine drops were applied to the eye.  The patient was taken to the recovery room in stable condition without complications of anesthesia or surgery.  [name] [place] 02/20/2023, 11:30 AM

## 2023-02-21 ENCOUNTER — Encounter: Payer: Self-pay | Admitting: Ophthalmology

## 2023-02-25 ENCOUNTER — Emergency Department: Payer: 59

## 2023-02-25 ENCOUNTER — Inpatient Hospital Stay
Admission: EM | Admit: 2023-02-25 | Discharge: 2023-03-02 | DRG: 689 | Disposition: A | Discharge status: Home Health | Payer: 59 | Attending: Student | Admitting: Student

## 2023-02-25 ENCOUNTER — Observation Stay: Payer: 59

## 2023-02-25 ENCOUNTER — Encounter: Payer: Self-pay | Admitting: Emergency Medicine

## 2023-02-25 ENCOUNTER — Other Ambulatory Visit: Payer: Self-pay

## 2023-02-25 DIAGNOSIS — M25562 Pain in left knee: Secondary | ICD-10-CM | POA: Diagnosis not present

## 2023-02-25 DIAGNOSIS — W19XXXA Unspecified fall, initial encounter: Secondary | ICD-10-CM | POA: Diagnosis not present

## 2023-02-25 DIAGNOSIS — M25462 Effusion, left knee: Secondary | ICD-10-CM | POA: Diagnosis not present

## 2023-02-25 DIAGNOSIS — E876 Hypokalemia: Secondary | ICD-10-CM | POA: Diagnosis present

## 2023-02-25 DIAGNOSIS — M47811 Spondylosis without myelopathy or radiculopathy, occipito-atlanto-axial region: Secondary | ICD-10-CM | POA: Diagnosis not present

## 2023-02-25 DIAGNOSIS — Z8349 Family history of other endocrine, nutritional and metabolic diseases: Secondary | ICD-10-CM | POA: Diagnosis not present

## 2023-02-25 DIAGNOSIS — R911 Solitary pulmonary nodule: Secondary | ICD-10-CM | POA: Diagnosis not present

## 2023-02-25 DIAGNOSIS — N3000 Acute cystitis without hematuria: Principal | ICD-10-CM | POA: Diagnosis present

## 2023-02-25 DIAGNOSIS — F419 Anxiety disorder, unspecified: Secondary | ICD-10-CM | POA: Diagnosis present

## 2023-02-25 DIAGNOSIS — M1712 Unilateral primary osteoarthritis, left knee: Secondary | ICD-10-CM | POA: Diagnosis not present

## 2023-02-25 DIAGNOSIS — J449 Chronic obstructive pulmonary disease, unspecified: Secondary | ICD-10-CM | POA: Diagnosis not present

## 2023-02-25 DIAGNOSIS — F17210 Nicotine dependence, cigarettes, uncomplicated: Secondary | ICD-10-CM | POA: Diagnosis not present

## 2023-02-25 DIAGNOSIS — E860 Dehydration: Secondary | ICD-10-CM | POA: Diagnosis present

## 2023-02-25 DIAGNOSIS — M47812 Spondylosis without myelopathy or radiculopathy, cervical region: Secondary | ICD-10-CM | POA: Diagnosis not present

## 2023-02-25 DIAGNOSIS — Z8249 Family history of ischemic heart disease and other diseases of the circulatory system: Secondary | ICD-10-CM | POA: Diagnosis not present

## 2023-02-25 DIAGNOSIS — I951 Orthostatic hypotension: Secondary | ICD-10-CM | POA: Diagnosis not present

## 2023-02-25 DIAGNOSIS — R410 Disorientation, unspecified: Principal | ICD-10-CM

## 2023-02-25 DIAGNOSIS — Z681 Body mass index (BMI) 19 or less, adult: Secondary | ICD-10-CM | POA: Diagnosis not present

## 2023-02-25 DIAGNOSIS — I959 Hypotension, unspecified: Secondary | ICD-10-CM | POA: Diagnosis not present

## 2023-02-25 DIAGNOSIS — E43 Unspecified severe protein-calorie malnutrition: Secondary | ICD-10-CM | POA: Diagnosis not present

## 2023-02-25 DIAGNOSIS — R64 Cachexia: Secondary | ICD-10-CM | POA: Diagnosis not present

## 2023-02-25 DIAGNOSIS — G9341 Metabolic encephalopathy: Secondary | ICD-10-CM | POA: Diagnosis not present

## 2023-02-25 DIAGNOSIS — M50222 Other cervical disc displacement at C5-C6 level: Secondary | ICD-10-CM | POA: Diagnosis not present

## 2023-02-25 DIAGNOSIS — R6889 Other general symptoms and signs: Secondary | ICD-10-CM | POA: Diagnosis not present

## 2023-02-25 DIAGNOSIS — S0990XA Unspecified injury of head, initial encounter: Secondary | ICD-10-CM | POA: Diagnosis not present

## 2023-02-25 DIAGNOSIS — F32A Depression, unspecified: Secondary | ICD-10-CM | POA: Diagnosis present

## 2023-02-25 DIAGNOSIS — M79605 Pain in left leg: Secondary | ICD-10-CM | POA: Diagnosis not present

## 2023-02-25 DIAGNOSIS — R29818 Other symptoms and signs involving the nervous system: Secondary | ICD-10-CM | POA: Diagnosis not present

## 2023-02-25 DIAGNOSIS — Z1152 Encounter for screening for COVID-19: Secondary | ICD-10-CM

## 2023-02-25 DIAGNOSIS — R4182 Altered mental status, unspecified: Secondary | ICD-10-CM | POA: Diagnosis present

## 2023-02-25 LAB — CBC WITH DIFFERENTIAL/PLATELET
Abs Immature Granulocytes: 0.03 10*3/uL (ref 0.00–0.07)
Basophils Absolute: 0.1 10*3/uL (ref 0.0–0.1)
Basophils Relative: 1 %
Eosinophils Absolute: 0.1 10*3/uL (ref 0.0–0.5)
Eosinophils Relative: 1 %
HCT: 35.2 % — ABNORMAL LOW (ref 36.0–46.0)
Hemoglobin: 11.8 g/dL — ABNORMAL LOW (ref 12.0–15.0)
Immature Granulocytes: 0 %
Lymphocytes Relative: 11 %
Lymphs Abs: 0.9 10*3/uL (ref 0.7–4.0)
MCH: 31.7 pg (ref 26.0–34.0)
MCHC: 33.5 g/dL (ref 30.0–36.0)
MCV: 94.6 fL (ref 80.0–100.0)
Monocytes Absolute: 1.2 10*3/uL — ABNORMAL HIGH (ref 0.1–1.0)
Monocytes Relative: 14 %
Neutro Abs: 6.3 10*3/uL (ref 1.7–7.7)
Neutrophils Relative %: 73 %
Platelets: 277 10*3/uL (ref 150–400)
RBC: 3.72 MIL/uL — ABNORMAL LOW (ref 3.87–5.11)
RDW: 12.9 % (ref 11.5–15.5)
WBC: 8.5 10*3/uL (ref 4.0–10.5)
nRBC: 0 % (ref 0.0–0.2)

## 2023-02-25 LAB — COMPREHENSIVE METABOLIC PANEL
ALT: 6 U/L (ref 0–44)
AST: 13 U/L — ABNORMAL LOW (ref 15–41)
Albumin: 3.1 g/dL — ABNORMAL LOW (ref 3.5–5.0)
Alkaline Phosphatase: 56 U/L (ref 38–126)
Anion gap: 12 (ref 5–15)
BUN: 18 mg/dL (ref 8–23)
CO2: 23 mmol/L (ref 22–32)
Calcium: 8.5 mg/dL — ABNORMAL LOW (ref 8.9–10.3)
Chloride: 103 mmol/L (ref 98–111)
Creatinine, Ser: 0.67 mg/dL (ref 0.44–1.00)
GFR, Estimated: >60 mL/min (ref >60)
Glucose, Bld: 107 mg/dL — ABNORMAL HIGH (ref 70–99)
Potassium: 2.7 mmol/L — CL (ref 3.5–5.1)
Sodium: 138 mmol/L (ref 135–145)
Total Bilirubin: 0.6 mg/dL (ref 0.0–1.2)
Total Protein: 6.3 g/dL — ABNORMAL LOW (ref 6.5–8.1)

## 2023-02-25 LAB — TSH: TSH: 1.142 u[IU]/mL (ref 0.350–4.500)

## 2023-02-25 LAB — ETHANOL: Alcohol, Ethyl (B): <10 mg/dL (ref <10)

## 2023-02-25 LAB — PHOSPHORUS: Phosphorus: 2 mg/dL — ABNORMAL LOW (ref 2.5–4.6)

## 2023-02-25 LAB — URINE DRUG SCREEN, QUALITATIVE (ARMC ONLY)
Amphetamines, Ur Screen: NOT DETECTED
Barbiturates, Ur Screen: NOT DETECTED
Benzodiazepine, Ur Scrn: POSITIVE — AB
Cannabinoid 50 Ng, Ur ~~LOC~~: NOT DETECTED
Cocaine Metabolite,Ur ~~LOC~~: NOT DETECTED
MDMA (Ecstasy)Ur Screen: NOT DETECTED
Methadone Scn, Ur: NOT DETECTED
Opiate, Ur Screen: NOT DETECTED
Phencyclidine (PCP) Ur S: NOT DETECTED
Tricyclic, Ur Screen: NOT DETECTED

## 2023-02-25 LAB — AMMONIA: Ammonia: 10 umol/L (ref 9–35)

## 2023-02-25 LAB — PROTIME-INR
INR: 1 (ref 0.8–1.2)
Prothrombin Time: 13.6 s (ref 11.4–15.2)

## 2023-02-25 LAB — CK: Total CK: 67 U/L (ref 38–234)

## 2023-02-25 LAB — RESP PANEL BY RT-PCR (RSV, FLU A&B, COVID)  RVPGX2
Influenza A by PCR: NEGATIVE
Influenza B by PCR: NEGATIVE
Resp Syncytial Virus by PCR: NEGATIVE
SARS Coronavirus 2 by RT PCR: NEGATIVE

## 2023-02-25 LAB — MAGNESIUM: Magnesium: 1.9 mg/dL (ref 1.7–2.4)

## 2023-02-25 LAB — LACTIC ACID, PLASMA: Lactic Acid, Venous: 1.3 mmol/L (ref 0.5–1.9)

## 2023-02-25 MED ORDER — METHYLPHENIDATE HCL 5 MG PO TABS: 20.0000 mg | ORAL_TABLET | Freq: Four times a day (QID) | ORAL | Status: DC

## 2023-02-25 MED ORDER — TRAZODONE HCL 100 MG PO TABS
100.0000 mg | ORAL_TABLET | Freq: Every day | ORAL | Status: DC
  Administered 2023-02-25 – 2023-03-01 (×5): 100 mg via ORAL
  Filled 2023-02-25 (×5): qty 1

## 2023-02-25 MED ORDER — ENSURE ENLIVE PO LIQD
237.0000 mL | Freq: Two times a day (BID) | ORAL | Status: DC
  Administered 2023-02-26 – 2023-02-27 (×3): 237 mL via ORAL

## 2023-02-25 MED ORDER — SODIUM CHLORIDE 0.9 % IV SOLN
1.0000 g | INTRAVENOUS | Status: DC
  Administered 2023-02-25 – 2023-02-26 (×2): 1 g via INTRAVENOUS
  Filled 2023-02-25 (×3): qty 10

## 2023-02-25 MED ORDER — HEPARIN SODIUM (PORCINE) 5000 UNIT/ML IJ SOLN
5000.0000 [IU] | Freq: Two times a day (BID) | INTRAMUSCULAR | Status: DC
  Administered 2023-02-25 – 2023-03-02 (×9): 5000 [IU] via SUBCUTANEOUS
  Filled 2023-02-25 (×10): qty 1

## 2023-02-25 MED ORDER — MIRTAZAPINE 15 MG PO TABS
15.0000 mg | ORAL_TABLET | Freq: Every day | ORAL | Status: DC
  Administered 2023-02-25 – 2023-03-01 (×5): 15 mg via ORAL
  Filled 2023-02-25 (×5): qty 1

## 2023-02-25 MED ORDER — LIDOCAINE 5 % EX PTCH: 1.0000 | MEDICATED_PATCH | Freq: Two times a day (BID) | CUTANEOUS | Status: DC

## 2023-02-25 MED ORDER — SODIUM CHLORIDE 0.9 % IV BOLUS
1000.0000 mL | Freq: Once | INTRAVENOUS | Status: AC
  Administered 2023-02-25: 1000 mL via INTRAVENOUS

## 2023-02-25 MED ORDER — POTASSIUM CHLORIDE CRYS ER 20 MEQ PO TBCR
40.0000 meq | EXTENDED_RELEASE_TABLET | Freq: Once | ORAL | Status: AC
Start: 2023-02-25 — End: 2023-02-25
  Administered 2023-02-25: 40 meq via ORAL
  Filled 2023-02-25: qty 2

## 2023-02-25 MED ORDER — BUSPIRONE HCL 10 MG PO TABS
15.0000 mg | ORAL_TABLET | Freq: Three times a day (TID) | ORAL | Status: DC
  Administered 2023-02-25 – 2023-03-02 (×14): 15 mg via ORAL
  Filled 2023-02-25: qty 3
  Filled 2023-02-25 (×11): qty 2
  Filled 2023-02-25 (×2): qty 3
  Filled 2023-02-25: qty 2

## 2023-02-25 MED ORDER — POTASSIUM CHLORIDE CRYS ER 20 MEQ PO TBCR
40.0000 meq | EXTENDED_RELEASE_TABLET | ORAL | Status: AC
  Administered 2023-02-25: 40 meq via ORAL
  Filled 2023-02-25: qty 2

## 2023-02-25 MED ORDER — POTASSIUM CHLORIDE CRYS ER 20 MEQ PO TBCR
40.0000 meq | EXTENDED_RELEASE_TABLET | Freq: Once | ORAL | Status: AC
  Administered 2023-02-25: 40 meq via ORAL
  Filled 2023-02-25: qty 2

## 2023-02-25 MED ORDER — ONDANSETRON HCL 4 MG PO TABS: 4.0000 mg | ORAL_TABLET | Freq: Four times a day (QID) | ORAL | Status: DC | PRN

## 2023-02-25 MED ORDER — ONDANSETRON HCL 4 MG/2ML IJ SOLN: 4.0000 mg | Freq: Four times a day (QID) | INTRAMUSCULAR | Status: DC | PRN

## 2023-02-25 MED ORDER — ALPRAZOLAM 0.25 MG PO TABS: 0.2500 mg | ORAL_TABLET | Freq: Two times a day (BID) | ORAL | Status: DC | PRN

## 2023-02-25 MED ORDER — METHYLPHENIDATE HCL 5 MG PO TABS
20.0000 mg | ORAL_TABLET | Freq: Four times a day (QID) | ORAL | Status: DC
  Administered 2023-02-26 – 2023-03-02 (×18): 20 mg via ORAL
  Filled 2023-02-25: qty 4
  Filled 2023-02-25: qty 1
  Filled 2023-02-25 (×18): qty 4

## 2023-02-25 NOTE — ED Provider Notes (Signed)
 [facility] Provider Note    Event Date/Time   First MD Initiated Contact with Patient 02/25/23 1302     (approximate)   History   Altered Mental Status  EM caveat, altered mental status  HPI  Sandra Stout is a 77 y.o. female with a history of COPD, encephalopathy, anxiety  Home patient relates she is not really sure what happened she feels fatigued.  Cannot describe it well.  Of note the patient also seem to go on tangents at times when discussing her reason for evaluation.  She does relate that she thinks she fell yesterday.  She had eye surgery a few days ago and has been recovering from that.  She does take medication including Xanax  but advises she never takes it more than prescribed  She is not any pain.  She is not noticed any fever headaches or other symptoms.  Of note, the patient will  somewhat tangential conversations eventually circling back to questions I asked her.  She does not seem to be well oriented     Physical Exam   Triage Vital Signs: ED Triage Vitals  Encounter Vitals Group     BP 02/25/23 1243 (!) 71/58     Systolic BP Percentile --      Diastolic BP Percentile --      Pulse Rate 02/25/23 1243 100     Resp 02/25/23 1243 18     Temp 02/25/23 1243 98.2 F (36.8 C)     Temp Source 02/25/23 1243 Oral     SpO2 02/25/23 1243 99 %     Weight 02/25/23 1240 94 lb 12.8 oz (43 kg)     Height 02/25/23 1240 5' 2 (1.575 m)     Head Circumference --      Peak Flow --      Pain Score 02/25/23 1240 0     Pain Loc --      Pain Education --      Exclude from Growth Chart --     Most recent vital signs: Vitals:   02/25/23 1400 02/25/23 1430  BP: 126/65 (!) 114/97  Pulse: 91 92  Resp: 11 18  Temp:    SpO2: 100% 100%     General: Awake, no distress.  Mucous membranes are dry.  Somewhat cachectic and underweight in appearance CV:  Good peripheral perfusion.  Normal tones and rate Resp:  Normal effort.  Clear bilateral normal  work of breathing Abd:  No distention.  Soft nontender nondistended throughout Other:  Moves extremities well.  No focal deficits.  Seems to be still sometimes staring off, difficult for her to concentrate and focus on conversation, though oriented to self recalls recent eye surgery and believes she fell yesterday but cannot seem to describe it well denies any injuries   ED Results / Procedures / Treatments   Labs (all labs ordered are listed, but only abnormal results are displayed) Labs Reviewed  CBC WITH DIFFERENTIAL/PLATELET - Abnormal; Notable for the following components:      Result Value   RBC 3.72 (*)    Hemoglobin 11.8 (*)    HCT 35.2 (*)    Monocytes Absolute 1.2 (*)    All other components within normal limits  COMPREHENSIVE METABOLIC PANEL - Abnormal; Notable for the following components:   Potassium 2.7 (*)    Glucose, Bld 107 (*)    Calcium  8.5 (*)    Total Protein 6.3 (*)    Albumin 3.1 (*)  AST 13 (*)    All other components within normal limits  CULTURE, BLOOD (ROUTINE X 2)  CULTURE, BLOOD (ROUTINE X 2)  RESP PANEL BY RT-PCR (RSV, FLU A&B, COVID)  RVPGX2  LACTIC ACID, PLASMA  PROTIME-INR  AMMONIA  TSH  CK  LACTIC ACID, PLASMA  URINALYSIS, W/ REFLEX TO CULTURE (INFECTION SUSPECTED)  VITAMIN B12  MAGNESIUM   PHOSPHORUS  POTASSIUM  URINE DRUG SCREEN, QUALITATIVE ([facility] ONLY)  ETHANOL  CBG MONITORING, ED     EKG  ED ECG REPORT I, Oneil Budge, the attending physician, personally viewed and interpreted this ECG.  Date: 02/25/2023 EKG Time: 2 PM Rate: 90 Rhythm: normal sinus rhythm QRS Axis: normal Intervals: normal ST/T Wave abnormalities: normal Narrative Interpretation: no evidence of acute ischemia    RADIOLOGY CT head interpreted by me as grossly negative for acute intracranial hemorrhage  No results found.  CT of the head and cervical spine is pending.  Dr. Arlander will follow-up on final results, also hospitalist Dr. Laurita aware of my  preliminary evaluation of the imaging but final result not complete yet (having delays of recent in obtaining CT reads)   PROCEDURES:  Critical Care performed: Yes, see critical care procedure note(s)  Procedures   MEDICATIONS ORDERED IN ED: Medications  potassium chloride  SA (KLOR-CON  M) CR tablet 40 mEq (has no administration in time range)  potassium chloride  SA (KLOR-CON  M) CR tablet 40 mEq (has no administration in time range)  sodium chloride  0.9 % bolus 1,000 mL (1,000 mLs Intravenous New Bag/Given 02/25/23 1313)     IMPRESSION / MDM / ASSESSMENT AND PLAN / ED COURSE  I reviewed the triage vital signs and the nursing notes.                              Differential diagnosis includes, but is not limited to, dehydration, infection, delirium of unknown cause possible medication related etc.  Differential diagnosis quite broad.  Thankfully with fluid resuscitation patient is now normotensive she is alert but disoriented seems to exhibit some tangential thinking and time seems to have difficulty focusing.  No obvious focal deficits.  Not notably febrile.  As of 3:40 PM, no clear indication that sepsis is present or indication for antibiotics.  Further workup to include urinalysis results of radiology studies, COVID swab etc. are pending.    Patient's presentation is most consistent with acute presentation with potential threat to life or bodily function.  ----------------------------------------- 2:06 PM on 02/25/2023 ----------------------------------------- Patient alert, still confused seemingly delirious.  She is compliant with care request, her blood pressure is now improved to 120/90, significant improvement after hydration.  Awaiting chemistry panel which had to be resent due to hemolysis  The patient is on the cardiac monitor to evaluate for evidence of arrhythmia and/or significant heart rate changes.   Consulted with and patient accepted by hospitalist service Dr  Laurita harvest of pending CT reads]     FINAL CLINICAL IMPRESSION(S) / ED DIAGNOSES   Final diagnoses:  Delirium  Hypokalemia  Hypotension, unspecified hypotension type     Rx / DC Orders   ED Discharge Orders     None        Note:  This document was prepared using Dragon voice recognition software and may include unintentional dictation errors.   Budge Oneil, MD 02/25/23 [phone]

## 2023-02-25 NOTE — ED Provider Notes (Signed)
.     Sharyn Creamer, MD 02/25/23 [phone]

## 2023-02-25 NOTE — ED Notes (Signed)
Critical Result: Potassium 2.7  Fanny Bien, MD made aware

## 2023-02-25 NOTE — H&P (Signed)
 History and Physical    Sandra Stout FMW:[id number] DOB: 20-Sep-1945 DOA: 02/25/2023  PCP: Orlean Alan HERO, FNP (Confirm with patient/family/NH records and if not entered, this has to be entered at [facility] point of entry) Patient coming from: Home  I have personally briefly reviewed patient's old medical records in [facility]  Chief Complaint: Feeling weak, burning sensation on urination  HPI: Sandra Stout is a 77 y.o. female with medical history significant of anxiety/depression, severe left knee OA, with chronic ambulation dysfunction, was found to be confused by family member.  Patient lives by herself with family member stop by frequently.  Last week, 5 days ago patient underwent right eye cataract extraction.  Since then patient vision on the right side has been worse than before the surgery.  She mainly relied on left eye for daily activities.  At baseline she is not active because of severe left knee arthritis.  And after the cataract surgery, her activity further reduced.  She eats mainly reheated frequent meals but denies any nauseous vomiting diarrhea.  She had only 1 pain pills in the last 5 days.  She does have chronic anxiety and taking daily Xanax  1-2 times a day but she denied any overdosing.  Since last week she has been having burning sensations when urinate she also reported strong odor of her urine pain.  But denied any back pain no fever chills.  Yesterday she sustained a fall but she could not tell me the details.  Overall she feels very frustrated with with her deterioration of activity and worsening of right-sided vision. ED Course: Afebrile, no tachycardia blood pressure 71/58 responded to 1000 of IV bolus blood pressure improved to 120/65.  CT head negative for acute findings, CT neck showed no acute cervical spine fracture or dislocation multiple cervical spine spondylosis with mild canal stenosis.  Blood work showed WBC 9.5, hemoglobin 11.8, K2.7, creatinine 0.8, BUN  18.  Review of Systems: As per HPI otherwise 14 point review of systems negative.    Past Medical History:  Diagnosis Date   Acute respiratory failure with hypoxia (HCC) 10/06/2018   ADD (attention deficit disorder)    Anxiety    Community acquired pneumonia 10/06/2018   Hemorrhoid    Hypokalemia    Hypokalemia 03/08/2021   Narcolepsy    Panic attacks    Pneumonia    Right middle lobe pneumonia 03/07/2021   Upper respiratory tract infection 03/02/2021   Wheezing 03/02/2021    Past Surgical History:  Procedure Laterality Date   CATARACT EXTRACTION W/PHACO Right 02/06/2023   Procedure: CATARACT EXTRACTION PHACO AND INTRAOCULAR LENS PLACEMENT (IOC) RIGHT  15.38 01:21.3;  Surgeon: Enola Feliciano Hugger, MD;  Location: [facility];  Service: Ophthalmology;  Laterality: Right;   CATARACT EXTRACTION W/PHACO Left 02/20/2023   Procedure: CATARACT EXTRACTION PHACO AND INTRAOCULAR LENS PLACEMENT (IOC) LEFT VISION BLUE 24.75 02:02.3;  Surgeon: Enola Feliciano Hugger, MD;  Location: [facility];  Service: Ophthalmology;  Laterality: Left;   TUBAL LIGATION       reports that she has been smoking cigarettes. She has been exposed to tobacco smoke. She has never used smokeless tobacco. She reports that she does not drink alcohol and does not use drugs.  Allergies  Allergen Reactions   Baclofen     Family History  Problem Relation Age of Onset   Lung disease Mother    Thyroid  disease Sister    Lung disease Sister    Cancer Sister  Bladder   Heart disease Father    Breast cancer Neg Hx    Prior to Admission medications   Medication Sig Start Date End Date Taking? Authorizing Provider  ALPRAZolam  (XANAX ) 1 MG tablet Take 1 mg by mouth 2 (two) times daily as needed for anxiety.    [provider]  busPIRone  (BUSPAR ) 15 MG tablet Take 15 mg by mouth 3 (three) times daily.    [provider]  cholecalciferol (VITAMIN D3) 25 MCG (1000 UNIT) tablet  Take 1,000 Units by mouth daily.    [provider]  co-enzyme Q-10 50 MG capsule Take 50 mg by mouth daily.    [provider]  lidocaine  (LIDODERM ) 5 % Place 1 patch onto the skin every 12 (twelve) hours. Remove & Discard patch within 12 hours or as directed by MD 04/16/22   Viviann Pastor, MD  methylphenidate  (RITALIN ) 20 MG tablet Take 3-4 tablets by mouth. Per day in divided doses 06/08/22   [provider]  mirtazapine  (REMERON ) 30 MG tablet Take 15 mg by mouth at bedtime. 05/30/21   [provider]  Omega-3 Fatty Acids (FISH OIL) 1000 MG CAPS Take by mouth.    [provider]  traZODone  (DESYREL ) 150 MG tablet Take 150 mg by mouth at bedtime.    [provider]  Vitamins-Lipotropics (BALANCED B-50) TABS Take by mouth.    [provider]    Physical Exam: Vitals:   02/25/23 1243 02/25/23 1332 02/25/23 1400 02/25/23 1430  BP: (!) 71/58 (!) 123/97 126/65 (!) 114/97  Pulse: 100 76 91 92  Resp: 18 (!) 24 11 18   Temp: 98.2 F (36.8 C)     TempSrc: Oral     SpO2: 99%  100% 100%  Weight:      Height:        Constitutional: NAD, calm, comfortable Vitals:   02/25/23 1243 02/25/23 1332 02/25/23 1400 02/25/23 1430  BP: (!) 71/58 (!) 123/97 126/65 (!) 114/97  Pulse: 100 76 91 92  Resp: 18 (!) 24 11 18   Temp: 98.2 F (36.8 C)     TempSrc: Oral     SpO2: 99%  100% 100%  Weight:      Height:       Eyes: PERRL, lids and conjunctivae normal ENMT: Mucous membranes are moist. Posterior pharynx clear of any exudate or lesions.Normal dentition.  Neck: normal, supple, no masses, no thyromegaly Respiratory: clear to auscultation bilaterally, no wheezing, no crackles. Normal respiratory effort. No accessory muscle use.  Cardiovascular: Regular rate and rhythm, no murmurs / rubs / gallops. No extremity edema. 2+ pedal pulses. No carotid bruits.  Abdomen: no tenderness, no masses palpated. No hepatosplenomegaly. Bowel sounds  positive.  Musculoskeletal: no clubbing / cyanosis. No joint deformity upper and lower extremities. Good ROM, no contractures. Normal muscle tone.  Skin: no rashes, lesions, ulcers. No induration Neurologic: CN 2-12 grossly intact. Sensation intact, DTR normal. Strength 5/5 in all 4.  Psychiatric: Normal judgment and insight. Alert and oriented x 3. Normal mood.    Labs on Admission: I have personally reviewed following labs and imaging studies  CBC: Recent Labs  Lab 02/25/23 1247  WBC 8.5  NEUTROABS 6.3  HGB 11.8*  HCT 35.2*  MCV 94.6  PLT 277   Basic Metabolic Panel: Recent Labs  Lab 02/25/23 1247 02/25/23 1400  NA  --  138  K  --  2.7*  CL  --  103  CO2  --  23  GLUCOSE  --  107*  BUN  --  18  CREATININE  --  0.67  CALCIUM   --  8.5*  MG 1.9  --   PHOS 2.0*  --    GFR: Estimated Creatinine Clearance: 39.3 mL/min (by C-G formula based on SCr of 0.67 mg/dL). Liver Function Tests: Recent Labs  Lab 02/25/23 1400  AST 13*  ALT 6  ALKPHOS 56  BILITOT 0.6  PROT 6.3*  ALBUMIN 3.1*   No results for input(s): LIPASE, AMYLASE in the last 168 hours. Recent Labs  Lab 02/25/23 1400  AMMONIA 10   Coagulation Profile: Recent Labs  Lab 02/25/23 1247  INR 1.0   Cardiac Enzymes: Recent Labs  Lab 02/25/23 1400  CKTOTAL 67   BNP (last 3 results) No results for input(s): PROBNP in the last 8760 hours. HbA1C: No results for input(s): HGBA1C in the last 72 hours. CBG: No results for input(s): GLUCAP in the last 168 hours. Lipid Profile: No results for input(s): CHOL, HDL, LDLCALC, TRIG, CHOLHDL, LDLDIRECT in the last 72 hours. Thyroid  Function Tests: Recent Labs    02/25/23 1247  TSH 1.142   Anemia Panel: No results for input(s): VITAMINB12, FOLATE, FERRITIN, TIBC, IRON, RETICCTPCT in the last 72 hours. Urine analysis:    Component Value Date/Time   COLORURINE STRAW (A) 04/16/2022 1615   APPEARANCEUR CLEAR (A)  04/16/2022 1615   APPEARANCEUR Clear 06/15/2021 1131   LABSPEC 1.006 04/16/2022 1615   LABSPEC 1.017 02/03/2014 1130   PHURINE 6.0 04/16/2022 1615   GLUCOSEU NEGATIVE 04/16/2022 1615   GLUCOSEU Negative 02/03/2014 1130   HGBUR NEGATIVE 04/16/2022 1615   BILIRUBINUR NEGATIVE 04/16/2022 1615   BILIRUBINUR Negative 06/15/2021 1131   BILIRUBINUR Negative 02/03/2014 1130   KETONESUR NEGATIVE 04/16/2022 1615   PROTEINUR NEGATIVE 04/16/2022 1615   NITRITE NEGATIVE 04/16/2022 1615   LEUKOCYTESUR NEGATIVE 04/16/2022 1615   LEUKOCYTESUR Negative 02/03/2014 1130    Radiological Exams on Admission: DG Chest 2 View Result Date: 02/25/2023 CLINICAL DATA:  Altered mental status. EXAM: CHEST - 2 VIEW COMPARISON:  PET-CT dated August 08, 2022. Chest radiograph dated March 07, 2021. FINDINGS: The heart size and mediastinal contours are within normal limits. The known hypermetabolic irregular anterior right lower lobe nodule is not well visualized on this exam. No focal consolidation, pleural effusion, or pneumothorax. No acute osseous abnormality. IMPRESSION: 1. No acute cardiopulmonary findings. 2. The known hypermetabolic irregular anterior right lower lobe nodule is not well visualized on this exam and is better evaluated on the prior PET-CT dated August 08, 2022. Electronically Signed   By: Harrietta Sherry M.D.   On: 02/25/2023 15:54   CT Head Wo Contrast Result Date: 02/25/2023 CLINICAL DATA:  Neuro deficit, acute, stroke suspected; Facial trauma, blunt. EXAM: CT HEAD WITHOUT CONTRAST CT CERVICAL SPINE WITHOUT CONTRAST TECHNIQUE: Multidetector CT imaging of the head and cervical spine was performed following the standard protocol without intravenous contrast. Multiplanar CT image reconstructions of the cervical spine were also generated. RADIATION DOSE REDUCTION: This exam was performed according to the departmental dose-optimization program which includes automated exposure control, adjustment of the mA  and/or kV according to patient size and/or use of iterative reconstruction technique. COMPARISON:  Head CT 02/03/2014.  MRI brain 03/10/2021. FINDINGS: CT HEAD FINDINGS Brain: No acute hemorrhage. Unchanged mild chronic small-vessel disease. Cortical gray-white differentiation is otherwise preserved. Prominence of the ventricles and sulci within expected range for age. No hydrocephalus or extra-axial collection. No mass effect or midline shift. Vascular: No hyperdense  vessel or unexpected calcification. Skull: No calvarial fracture or suspicious bone lesion. Skull base is unremarkable. Sinuses/Orbits: No acute finding. Other: None. CT CERVICAL SPINE FINDINGS Alignment: 3 mm degenerative anterolisthesis of C3 on C4 and C4 on C5. 3 mm degenerative retrolisthesis of C5 on C6. No traumatic malalignment. Skull base and vertebrae: No acute fracture. Moderate degenerative changes of the C1-2 articulation with mild degenerative pannus. Soft tissues and spinal canal: No prevertebral fluid or swelling. No visible canal hematoma. Disc levels: Multilevel cervical spondylosis, worst at C4-5 and C5-6, where there is at least mild spinal canal stenosis. Upper chest: No acute findings. Other: Atherosclerotic calcifications of the carotid bulbs. IMPRESSION: 1. No acute intracranial abnormality. 2. No acute cervical spine fracture or traumatic malalignment. 3. Multilevel cervical spondylosis, worst at C4-5 and C5-6, where there is at least mild spinal canal stenosis. Electronically Signed   By: Ryan Chess M.D.   On: 02/25/2023 15:49   CT Cervical Spine Wo Contrast Result Date: 02/25/2023 CLINICAL DATA:  Neuro deficit, acute, stroke suspected; Facial trauma, blunt. EXAM: CT HEAD WITHOUT CONTRAST CT CERVICAL SPINE WITHOUT CONTRAST TECHNIQUE: Multidetector CT imaging of the head and cervical spine was performed following the standard protocol without intravenous contrast. Multiplanar CT image reconstructions of the cervical  spine were also generated. RADIATION DOSE REDUCTION: This exam was performed according to the departmental dose-optimization program which includes automated exposure control, adjustment of the mA and/or kV according to patient size and/or use of iterative reconstruction technique. COMPARISON:  Head CT 02/03/2014.  MRI brain 03/10/2021. FINDINGS: CT HEAD FINDINGS Brain: No acute hemorrhage. Unchanged mild chronic small-vessel disease. Cortical gray-white differentiation is otherwise preserved. Prominence of the ventricles and sulci within expected range for age. No hydrocephalus or extra-axial collection. No mass effect or midline shift. Vascular: No hyperdense vessel or unexpected calcification. Skull: No calvarial fracture or suspicious bone lesion. Skull base is unremarkable. Sinuses/Orbits: No acute finding. Other: None. CT CERVICAL SPINE FINDINGS Alignment: 3 mm degenerative anterolisthesis of C3 on C4 and C4 on C5. 3 mm degenerative retrolisthesis of C5 on C6. No traumatic malalignment. Skull base and vertebrae: No acute fracture. Moderate degenerative changes of the C1-2 articulation with mild degenerative pannus. Soft tissues and spinal canal: No prevertebral fluid or swelling. No visible canal hematoma. Disc levels: Multilevel cervical spondylosis, worst at C4-5 and C5-6, where there is at least mild spinal canal stenosis. Upper chest: No acute findings. Other: Atherosclerotic calcifications of the carotid bulbs. IMPRESSION: 1. No acute intracranial abnormality. 2. No acute cervical spine fracture or traumatic malalignment. 3. Multilevel cervical spondylosis, worst at C4-5 and C5-6, where there is at least mild spinal canal stenosis. Electronically Signed   By: Ryan Chess M.D.   On: 02/25/2023 15:49    EKG: Independently reviewed.  Sinus, no acute ST changes.  Assessment/Plan Principal Problem:   AMS (altered mental status) Active Problems:   Acute metabolic encephalopathy  (please populate  well all problems here in Problem List. (For example, if patient is on BP meds at home and you resume or decide to hold them, it is a problem that needs to be her. Same for CAD, COPD, HLD and so on)  Acute metabolic encephalopathy -Mostly resolved and mentation has come back to close to baseline -Probably secondary to combined effect from hypotension and UTI. -Probably dehydrated as after given 1 L IV bolus in ED her mentation has significantly improved.  She was able to answer all my questions and oriented x 3. -Will treat UTI  with ceftriaxone  while waiting for UA -CT head and neck reassuring. -Other DDx, polypharmacy less likely as patient reported no change of her anxiety/depression medications.  And she does not take any narcotics.  Acute on chronic ambulation dysfunction -Likely a combined effect from worsening of right-sided vision after recent cataract extraction as well as worsening of the left knee OA. -Check left knee x-ray -PT evaluation -Consult case manager for home care and/or visiting nurse  Severe hypokalemia -Denied any GI symptoms no nausea vomiting or diarrhea and patient reported eating mainly frozen meals.  Will check Mg and Phos level  Severe protein calorie malnutrition -BMI=17 -Consult dietitian, start Ensure  Anxiety/depression -Plan to reduce her Xanax  dosage to 0.25 milligram as needed -Continue SSRIs  DVT prophylaxis: Heparin  subcu Code Status: Full code Family Communication: Left daughter message Disposition Plan: Expect less than 2 midnight hospital stay Consults called: None Admission status: Telemetry observation   Cort ONEIDA Mana MD [facility] Pager [phone]  02/25/2023, 4:15 PM

## 2023-02-25 NOTE — ED Triage Notes (Signed)
 Patient to ED via [facility] from home for AMS. Pt reports a fall- unknown when. [facility] 2 days ago. Pt disoriented to time per EMS. States she has been having burning with urination with a mal odor.

## 2023-02-26 DIAGNOSIS — N3000 Acute cystitis without hematuria: Secondary | ICD-10-CM | POA: Diagnosis present

## 2023-02-26 DIAGNOSIS — E43 Unspecified severe protein-calorie malnutrition: Secondary | ICD-10-CM | POA: Insufficient documentation

## 2023-02-26 DIAGNOSIS — I951 Orthostatic hypotension: Secondary | ICD-10-CM | POA: Diagnosis present

## 2023-02-26 DIAGNOSIS — G9341 Metabolic encephalopathy: Secondary | ICD-10-CM | POA: Diagnosis present

## 2023-02-26 DIAGNOSIS — E860 Dehydration: Secondary | ICD-10-CM | POA: Diagnosis present

## 2023-02-26 DIAGNOSIS — R64 Cachexia: Secondary | ICD-10-CM | POA: Diagnosis present

## 2023-02-26 DIAGNOSIS — Z1152 Encounter for screening for COVID-19: Secondary | ICD-10-CM | POA: Diagnosis not present

## 2023-02-26 DIAGNOSIS — F32A Depression, unspecified: Secondary | ICD-10-CM | POA: Diagnosis present

## 2023-02-26 DIAGNOSIS — Z681 Body mass index (BMI) 19 or less, adult: Secondary | ICD-10-CM | POA: Diagnosis not present

## 2023-02-26 DIAGNOSIS — Z8249 Family history of ischemic heart disease and other diseases of the circulatory system: Secondary | ICD-10-CM | POA: Diagnosis not present

## 2023-02-26 DIAGNOSIS — Z8349 Family history of other endocrine, nutritional and metabolic diseases: Secondary | ICD-10-CM | POA: Diagnosis not present

## 2023-02-26 DIAGNOSIS — F419 Anxiety disorder, unspecified: Secondary | ICD-10-CM | POA: Diagnosis present

## 2023-02-26 DIAGNOSIS — F17210 Nicotine dependence, cigarettes, uncomplicated: Secondary | ICD-10-CM | POA: Diagnosis present

## 2023-02-26 DIAGNOSIS — J449 Chronic obstructive pulmonary disease, unspecified: Secondary | ICD-10-CM | POA: Diagnosis present

## 2023-02-26 DIAGNOSIS — M1712 Unilateral primary osteoarthritis, left knee: Secondary | ICD-10-CM | POA: Diagnosis present

## 2023-02-26 DIAGNOSIS — E876 Hypokalemia: Secondary | ICD-10-CM | POA: Diagnosis present

## 2023-02-26 LAB — URINALYSIS, W/ REFLEX TO CULTURE (INFECTION SUSPECTED)
Bacteria, UA: NONE SEEN
Bilirubin Urine: NEGATIVE
Glucose, UA: NEGATIVE mg/dL
Ketones, ur: 5 mg/dL — AB
Nitrite: POSITIVE — AB
Protein, ur: 30 mg/dL — AB
Specific Gravity, Urine: 1.014 (ref 1.005–1.030)
WBC, UA: >50 WBC/hpf (ref 0–5)
pH: 7 (ref 5.0–8.0)

## 2023-02-26 LAB — POTASSIUM: Potassium: 4.1 mmol/L (ref 3.5–5.1)

## 2023-02-26 MED ORDER — DEXTROSE-SODIUM CHLORIDE 5-0.2 % IV SOLN
INTRAVENOUS | Status: AC
  Filled 2023-02-26: qty 1000

## 2023-02-26 NOTE — Assessment & Plan Note (Addendum)
-   Suspected in setting of UTI - Mentation has improved; component of anxiety appreciated and possibly some underlying cognitive impairment; ideally needs to be weaned off xanax and/or ritalin outpatient before full assessment

## 2023-02-26 NOTE — Assessment & Plan Note (Signed)
-   Patient's BMI is Body mass index is 17.34 kg/m.. - Patient has the following signs/symptoms consistent with PCM: (fat loss, muscle loss, muscle wasting).

## 2023-02-26 NOTE — Hospital Course (Signed)
 Sandra Stout is a 78 y.o. female with medical history significant of anxiety/depression, severe left knee OA, with chronic ambulation dysfunction who was brought to the ER with altered mentation per family. She lives alone and is checked on frequently by family.  She has recently undergone cataract surgery. Since surgery, her activity has reduced.  She has also had some worsening weakness and fell prior to admission. Imaging studies were performed on admission showing left knee osteoarthritis with small joint effusion.  CXR unremarkable with known hypermetabolic right lower lobe nodule noted. CT head and cervical spine negative for acute abnormalities and showed underlying multilevel cervical spondylosis. Urinalysis was notable for positive nitrite, moderate LE, greater than 50 WBC.  She was started antibiotics and admitted for UTI treatment.

## 2023-02-26 NOTE — Assessment & Plan Note (Addendum)
-   BP drop when evaluated by therapy on 1/1 - follow up repeat orthostatics - s/p IVF

## 2023-02-26 NOTE — Assessment & Plan Note (Addendum)
-   UA noted with positive nitrite, moderate LE, greater than 50 WBC.  Patient also had weakness and altered mentation on admission - Can transition Rocephin  to cefadroxil  and complete 5-day course - Urine culture noted with no growth.  Will plan to complete empiric course

## 2023-02-26 NOTE — Assessment & Plan Note (Signed)
-

## 2023-02-26 NOTE — Evaluation (Signed)
 Physical Therapy Evaluation Patient Details Name: Sandra Stout MRN: [id number] DOB: September 08, 1945 Today's Date: 02/26/2023  History of Present Illness  78 y/o female presented to ED on 02/25/23 for AMS and fall. Admitted for acute metabolic encephalopathy. PMH: anxiety, nacrolepsy, COPD, depression  Clinical Impression  Patient admitted with the above. Patient is poor historian this date so unsure of PLOF. Patient reporting her daughter lives with her and provides some assistance but then also says she doesn't help her. Patient oriented to self this date. Required minA+2 to stand from high stretcher with B knees blocked. Patient reporting dizziness in standing and delayed responses noted. Orthostatics obtained, see below. Patient will benefit from skilled PT services during acute stay to address listed deficits. Patient will benefit from ongoing therapy at discharge to maximize functional independence and safety.   Orthostatic BPs  Supine 119/60  Sitting 101/49  Standing 82/53  Supine 106/59 HR 110         If plan is discharge home, recommend the following: A lot of help with walking and/or transfers;A lot of help with bathing/dressing/bathroom;Assistance with cooking/housework;Direct supervision/assist for medications management;Direct supervision/assist for financial management;Assist for transportation;Help with stairs or ramp for entrance;Supervision due to cognitive status   Can travel by private vehicle        Equipment Recommendations Rolling Juvencio Verdi (2 wheels);BSC/3in1  Recommendations for Other Services       Functional Status Assessment Patient has had a recent decline in their functional status and demonstrates the ability to make significant improvements in function in a reasonable and predictable amount of time.     Precautions / Restrictions Precautions Precautions: Fall Restrictions Weight Bearing Restrictions Per Provider Order: No      Mobility  Bed  Mobility Overal bed mobility: Needs Assistance Bed Mobility: Supine to Sit, Sit to Supine     Supine to sit: Contact guard Sit to supine: Max assist        Transfers Overall transfer level: Needs assistance Equipment used: 2 person hand held assist Transfers: Sit to/from Stand Sit to Stand: Min assist, +2 physical assistance, +2 safety/equipment           General transfer comment: assist to stand and steady. Blocking of both knees from high stretcher    Ambulation/Gait                  Stairs            Wheelchair Mobility     Tilt Bed    Modified Rankin (Stroke Patients Only)       Balance Overall balance assessment: Needs assistance Sitting-balance support: No upper extremity supported, Feet unsupported Sitting balance-Leahy Scale: Good     Standing balance support: Bilateral upper extremity supported, During functional activity Standing balance-Leahy Scale: Poor                               Pertinent Vitals/Pain Pain Assessment Pain Assessment: No/denies pain    Home Living Family/patient expects to be discharged to:: Private residence Living Arrangements: Children (daugther) Available Help at Discharge: Family;Available PRN/intermittently Type of Home: Mobile home Home Access: Stairs to enter Entrance Stairs-Rails: Right;Left;Can reach both Entrance Stairs-Number of Steps: 4   Home Layout: One level Home Equipment: [employer] (2 wheels) Additional Comments: pt reports her daugther lives with her, helps her at times; Poor historian, will need to confirm PLOF/home set up    Prior Function Prior Level of Function :  Other (comment);Patient poor historian/Family not available             Mobility Comments: unsure as patient had multiple versions of how she mobilized around the home       Extremity/Trunk Assessment   Upper Extremity Assessment Upper Extremity Assessment: Defer to OT evaluation    Lower  Extremity Assessment Lower Extremity Assessment: Generalized weakness    Cervical / Trunk Assessment Cervical / Trunk Assessment: Kyphotic  Communication   Communication Communication: No apparent difficulties  Cognition Arousal: Alert Behavior During Therapy: Flat affect, Impulsive Overall Cognitive Status: Impaired/Different from baseline Area of Impairment: Orientation, Attention, Memory, Safety/judgement, Awareness, Problem solving, Following commands                 Orientation Level: Disoriented to, Place, Situation, Time Current Attention Level: Sustained Memory: Decreased short-term memory Following Commands: Follows one step commands inconsistently, Follows one step commands with increased time Safety/Judgement: Decreased awareness of safety, Decreased awareness of deficits Awareness: Intellectual Problem Solving: Slow processing, Difficulty sequencing, Requires verbal cues          General Comments General comments (skin integrity, edema, etc.): orthostatic during session, see vitals flowsheet    Exercises     Assessment/Plan    PT Assessment Patient needs continued PT services  PT Problem List Decreased strength;Decreased activity tolerance;Decreased balance;Decreased mobility;Decreased cognition;Decreased safety awareness;Decreased knowledge of use of DME;Decreased knowledge of precautions;Cardiopulmonary status limiting activity       PT Treatment Interventions DME instruction;Gait training;Functional mobility training;Therapeutic activities;Therapeutic exercise;Balance training;Patient/family education;Stair training    PT Goals (Current goals can be found in the Care Plan section)  Acute Rehab PT Goals Patient Stated Goal: did not state PT Goal Formulation: Patient unable to participate in goal setting Time For Goal Achievement: 03/12/23 Potential to Achieve Goals: Fair    Frequency Min 1X/week     Co-evaluation PT/OT/SLP  Co-Evaluation/Treatment: Yes Reason for Co-Treatment: For patient/therapist safety;To address functional/ADL transfers;Necessary to address cognition/behavior during functional activity PT goals addressed during session: Mobility/safety with mobility         AM-PAC PT 6 Clicks Mobility  Outcome Measure Help needed turning from your back to your side while in a flat bed without using bedrails?: A Little Help needed moving from lying on your back to sitting on the side of a flat bed without using bedrails?: A Little Help needed moving to and from a bed to a chair (including a wheelchair)?: A Lot Help needed standing up from a chair using your arms (e.g., wheelchair or bedside chair)?: Total Help needed to walk in hospital room?: Total Help needed climbing 3-5 steps with a railing? : Total 6 Click Score: 11    End of Session   Activity Tolerance: Patient tolerated treatment well Patient left: in bed;with call bell/phone within reach Nurse Communication: Mobility status PT Visit Diagnosis: Unsteadiness on feet (R26.81);Muscle weakness (generalized) (M62.81);Other abnormalities of gait and mobility (R26.89)    Time: 8871-8845 PT Time Calculation (min) (ACUTE ONLY): 26 min   Charges:   PT Evaluation $PT Eval Moderate Complexity: 1 Mod   PT General Charges $$ ACUTE PT VISIT: 1 Visit         Maryanne Finder, PT, DPT Physical Therapist - [place]  [facility]   Leilanee Righetti A Aarica Wax 02/26/2023, 1:12 PM

## 2023-02-26 NOTE — Progress Notes (Signed)
 Progress Note    Sandra Stout   FMW:[id number]  DOB: 08-Apr-1945  DOA: 02/25/2023     0 PCP: Orlean Alan HERO, FNP  Initial CC: AMS  Hospital Course: Sandra Stout is a 78 y.o. female with medical history significant of anxiety/depression, severe left knee OA, with chronic ambulation dysfunction who was brought to the ER with altered mentation per family. She lives alone and is checked on frequently by family.  She has recently undergone cataract surgery. Since surgery, her activity has reduced.  She has also had some worsening weakness and fell prior to admission. Imaging studies were performed on admission showing left knee osteoarthritis with small joint effusion.  CXR unremarkable with known hypermetabolic right lower lobe nodule noted. CT head and cervical spine negative for acute abnormalities and showed underlying multilevel cervical spondylosis. Urinalysis was notable for positive nitrite, moderate LE, greater than 50 WBC.  She was started antibiotics and admitted for UTI treatment.  Interval History:  Improved mentation this morning. No concerns when seen.   Assessment and Plan: * Acute cystitis - UA noted with positive nitrite, moderate LE, greater than 50 WBC.  Patient also had weakness and altered mentation on admission - Continue Rocephin  - Follow-up urine culture  Orthostatic hypotension - BP drop when evaluated by therapy - Continue fluids and repeat orthostatics tomorrow  Acute metabolic encephalopathy - Suspected in setting of UTI - Mentation has already improved when seen this morning  Protein-calorie malnutrition, severe (HCC) - Patient's BMI is Body mass index is 17.34 kg/m.. - Patient has the following signs/symptoms consistent with PCM: (fat loss, muscle loss, muscle wasting).   Hypokalemia - Replete as needed  Anxiety - Continue Xanax  -Continue BuSpar , mirtazapine , trazodone    Old records reviewed in assessment of this  patient  Antimicrobials: Rocephin  02/25/2023 >> current  DVT prophylaxis:  heparin  injection 5,000 Units Start: 02/25/23 2200   Code Status:   Code Status: Full Code  Mobility Assessment (Last 72 Hours)     Mobility Assessment     Row Name 02/26/23 1305 02/26/23 00:36:50         Does patient have an order for bedrest or is patient medically unstable -- No - Continue assessment      What is the highest level of mobility based on the progressive mobility assessment? Level 3 (Stands with assist) - Balance while standing  and cannot march in place Level 5 (Walks with assist in room/hall) - Balance while stepping forward/back and can walk in room with assist - Complete               Barriers to discharge: none Disposition Plan:  Home HH orders placed:  Status is: Inpt  Objective: Blood pressure (!) 102/45, pulse 96, temperature (!) 97.5 F (36.4 C), temperature source Axillary, resp. rate 15, height 5' 2 (1.575 m), weight 43 kg, SpO2 98%.  Examination:  Physical Exam Constitutional:      Appearance: Normal appearance.  HENT:     Head: Normocephalic and atraumatic.     Mouth/Throat:     Mouth: Mucous membranes are moist.  Eyes:     Extraocular Movements: Extraocular movements intact.  Cardiovascular:     Rate and Rhythm: Normal rate and regular rhythm.  Pulmonary:     Effort: Pulmonary effort is normal. No respiratory distress.     Breath sounds: Normal breath sounds. No wheezing.  Abdominal:     General: Bowel sounds are normal. There is no distension.  Palpations: Abdomen is soft.     Tenderness: There is no abdominal tenderness.  Musculoskeletal:        General: Normal range of motion.     Cervical back: Normal range of motion and neck supple.  Skin:    General: Skin is warm and dry.  Neurological:     General: No focal deficit present.     Mental Status: She is alert.  Psychiatric:        Mood and Affect: Mood normal.      Consultants:     Procedures:    Data Reviewed: Results for orders placed or performed during the hospital encounter of 02/25/23 (from the past 24 hours)  Ammonia     Status: None   Collection Time: 02/25/23  2:00 PM  Result Value Ref Range   Ammonia 10 9 - 35 umol/L  CK     Status: None   Collection Time: 02/25/23  2:00 PM  Result Value Ref Range   Total CK 67 38 - 234 U/L  Comprehensive metabolic panel     Status: Abnormal   Collection Time: 02/25/23  2:00 PM  Result Value Ref Range   Sodium 138 135 - 145 mmol/L   Potassium 2.7 (LL) 3.5 - 5.1 mmol/L   Chloride 103 98 - 111 mmol/L   CO2 23 22 - 32 mmol/L   Glucose, Bld 107 (H) 70 - 99 mg/dL   BUN 18 8 - 23 mg/dL   Creatinine, Ser 9.32 0.44 - 1.00 mg/dL   Calcium  8.5 (L) 8.9 - 10.3 mg/dL   Total Protein 6.3 (L) 6.5 - 8.1 g/dL   Albumin 3.1 (L) 3.5 - 5.0 g/dL   AST 13 (L) 15 - 41 U/L   ALT 6 0 - 44 U/L   Alkaline Phosphatase 56 38 - 126 U/L   Total Bilirubin 0.6 0.0 - 1.2 mg/dL   GFR, Estimated >39 >39 mL/min   Anion gap 12 5 - 15  Ethanol     Status: None   Collection Time: 02/25/23  4:00 PM  Result Value Ref Range   Alcohol, Ethyl (B) <10 <10 mg/dL  Resp panel by RT-PCR (RSV, Flu A&B, Covid) Anterior Nasal Swab     Status: None   Collection Time: 02/25/23  4:51 PM   Specimen: Anterior Nasal Swab  Result Value Ref Range   SARS Coronavirus 2 by RT PCR NEGATIVE NEGATIVE   Influenza A by PCR NEGATIVE NEGATIVE   Influenza B by PCR NEGATIVE NEGATIVE   Resp Syncytial Virus by PCR NEGATIVE NEGATIVE  Urinalysis, w/ Reflex to Culture (Infection Suspected) -Urine, Clean Catch     Status: Abnormal   Collection Time: 02/25/23 11:06 PM  Result Value Ref Range   Specimen Source URINE, CATHETERIZED    Color, Urine YELLOW (A) YELLOW   APPearance HAZY (A) CLEAR   Specific Gravity, Urine 1.014 1.005 - 1.030   pH 7.0 5.0 - 8.0   Glucose, UA NEGATIVE NEGATIVE mg/dL   Hgb urine dipstick SMALL (A) NEGATIVE   Bilirubin Urine NEGATIVE NEGATIVE    Ketones, ur 5 (A) NEGATIVE mg/dL   Protein, ur 30 (A) NEGATIVE mg/dL   Nitrite POSITIVE (A) NEGATIVE   Leukocytes,Ua MODERATE (A) NEGATIVE   RBC / HPF 0-5 0 - 5 RBC/hpf   WBC, UA >50 0 - 5 WBC/hpf   Bacteria, UA NONE SEEN NONE SEEN   Squamous Epithelial / HPF 0-5 0 - 5 /HPF   WBC Clumps PRESENT  Mucus PRESENT   Urine Drug Screen, Qualitative ([facility] only)     Status: Abnormal   Collection Time: 02/25/23 11:06 PM  Result Value Ref Range   Tricyclic, Ur Screen NONE DETECTED NONE DETECTED   Amphetamines, Ur Screen NONE DETECTED NONE DETECTED   MDMA (Ecstasy)Ur Screen NONE DETECTED NONE DETECTED   Cocaine Metabolite,Ur [place] NONE DETECTED NONE DETECTED   Opiate, Ur Screen NONE DETECTED NONE DETECTED   Phencyclidine (PCP) Ur S NONE DETECTED NONE DETECTED   Cannabinoid 50 Ng, Ur [place] NONE DETECTED NONE DETECTED   Barbiturates, Ur Screen NONE DETECTED NONE DETECTED   Benzodiazepine, Ur Scrn POSITIVE (A) NONE DETECTED   Methadone Scn, Ur NONE DETECTED NONE DETECTED  Potassium     Status: None   Collection Time: 02/26/23  2:09 AM  Result Value Ref Range   Potassium 4.1 3.5 - 5.1 mmol/L    I have reviewed pertinent nursing notes, vitals, labs, and images as necessary. I have ordered labwork to follow up on as indicated.  I have reviewed the last notes from staff over past 24 hours. I have discussed patient's care plan and test results with nursing staff, CM/SW, and other staff as appropriate.  Time spent: Greater than 50% of the 55 minute visit was spent in counseling/coordination of care for the patient as laid out in the A&P.   LOS: 0 days   Alm Apo, MD [facility] 02/26/2023, 1:14 PM

## 2023-02-26 NOTE — Evaluation (Signed)
 Occupational Therapy Evaluation Patient Details Name: Sandra Stout MRN: [id number] DOB: [birth year] Today's Date: 02/26/2023   History of Present Illness 78 y/o female presented to ED on 02/25/23 for AMS and fall. Admitted for acute metabolic encephalopathy. PMH: anxiety, nacrolepsy, COPD, depression   Clinical Impression   Chart reviewed, pt oriented to self only, stating we are at her trailer, laying off left side of bed upon entering room. Co tx completed with PT on this date. Pt is tangential throughout, mutli modal cues/increased time for one step direction following. Pt is a poor historian re: Home set up/PLOF however pt does report her daughter has to help her walk recently and she takes a sponge bath at recent baseline. Pt presents with deficits in activity tolerance, balance, strength, cognition, affecting safe and optimal ADL completion. Bed mobility completed with CGA for supine>sit, sit>supine with MAX A +2 due to positive orthostatics. Please refer to flowhseet for vitals, nurse/MD made aware via secure chat. MAX A required for LB dressing. Pt will benefit from acute OT to address deficits and to facilitate optimal ADL performance. Pt is left semi supine in bed with lunch tray set up, all needs met.     If plan is discharge home, recommend the following: A lot of help with walking and/or transfers;A lot of help with bathing/dressing/bathroom;Supervision due to cognitive status    Functional Status Assessment  Patient has had a recent decline in their functional status and demonstrates the ability to make significant improvements in function in a reasonable and predictable amount of time.  Equipment Recommendations  Other (comment) (defer per next venue of care)    Recommendations for Other Services       Precautions / Restrictions Precautions Precautions: Fall Restrictions Weight Bearing Restrictions Per Provider Order: No      Mobility Bed Mobility Overal bed mobility:  Needs Assistance Bed Mobility: Supine to Sit, Sit to Supine     Supine to sit: Contact guard Sit to supine: Max assist, +2 for safety/equipment   General bed mobility comments: pt supine hanging off the R side of the bed upon entering room, could re-adjust and transition to sitting on edge of bed with CGA; MAX A +2 for return to supine due to symptomatic orthostatics    Transfers Overall transfer level: Needs assistance Equipment used: 2 person hand held assist Transfers: Sit to/from Stand Sit to Stand: Min assist, +2 physical assistance, +2 safety/equipment           General transfer comment: mutiple attempts off of stretcher; B kneeds blocked      Balance Overall balance assessment: Needs assistance Sitting-balance support: No upper extremity supported, Feet unsupported Sitting balance-Leahy Scale: Fair     Standing balance support: Bilateral upper extremity supported, During functional activity Standing balance-Leahy Scale: Poor                             ADL either performed or assessed with clinical judgement   ADL Overall ADL's : Needs assistance/impaired Eating/Feeding: Set up;Sitting Eating/Feeding Details (indicate cue type and reason): food cut up Grooming: Minimal assistance;Sitting Grooming Details (indicate cue type and reason): anticipate         Upper Body Dressing : Minimal assistance;Sitting   Lower Body Dressing: Maximal assistance;Bed level Lower Body Dressing Details (indicate cue type and reason): donn/doff socks; pt attempted and unable to peform in supine or sitting     Toileting- Clothing Manipulation and Hygiene: Maximal  assistance;Sitting/lateral lean               Vision Patient Visual Report: No change from baseline Additional Comments: pt reports ongoing vision issues including cataracts surgery recently however due to current cognitive status unsure of timeline/baseline; Generally can identify objects around the  room, ate from all parts of her lunch tray;  will continue to assess     Perception         Praxis         Pertinent Vitals/Pain Pain Assessment Pain Assessment: No/denies pain     Extremity/Trunk Assessment Upper Extremity Assessment Upper Extremity Assessment: Generalized weakness   Lower Extremity Assessment Lower Extremity Assessment: Generalized weakness   Cervical / Trunk Assessment Cervical / Trunk Assessment: Kyphotic   Communication Communication Communication: Difficulty following commands/understanding Following commands: Follows one step commands inconsistently;Follows one step commands with increased time Cueing Techniques: Verbal cues   Cognition Arousal: Alert Behavior During Therapy: Flat affect, Impulsive Overall Cognitive Status: Impaired/Different from baseline Area of Impairment: Orientation, Attention, Memory, Safety/judgement, Awareness, Problem solving, Following commands                 Orientation Level: Disoriented to, Place, Time, Situation (I'm in my trailor) Current Attention Level: Sustained Memory: Decreased short-term memory Following Commands: Follows one step commands inconsistently, Follows one step commands with increased time Safety/Judgement: Decreased awareness of safety, Decreased awareness of deficits Awareness: Intellectual Problem Solving: Slow processing, Difficulty sequencing, Requires verbal cues General Comments: pt is tangential throughout     General Comments  orthostatic during session, see vitals flow sheet    Exercises Other Exercises Other Exercises: edu re: role of OT, role of rehab   Shoulder Instructions      Home Living Family/patient expects to be discharged to:: Private residence Living Arrangements: Children (daugther) Available Help at Discharge: Family;Available PRN/intermittently Type of Home: Mobile home Home Access: Stairs to enter Entrance Stairs-Number of Steps: 4 Entrance  Stairs-Rails: Right;Left;Can reach both Home Layout: One level     Bathroom Shower/Tub: Chief Strategy Officer: Standard     Home Equipment: Agricultural Consultant (2 wheels)   Additional Comments: pt reports her daugther lives with her, helps her at times; Poor historian, will need to confirm PLOF/home set up      Prior Functioning/Environment Prior Level of Function : Other (comment);Patient poor historian/Family not available             Mobility Comments: unsure as patient had multiple versions of how she mobilized around the home ADLs Comments: pt is a poor historian, will need to confirm; reports she needs help with IADLs, sponge bathes at baseline        OT Problem List: Decreased strength;Decreased activity tolerance;Decreased coordination;Impaired balance (sitting and/or standing);Decreased cognition;Decreased safety awareness;Decreased knowledge of use of DME or AE      OT Treatment/Interventions: Self-care/ADL training;Therapeutic exercise;Patient/family education;Balance training;Energy conservation;Therapeutic activities;DME and/or AE instruction;Cognitive remediation/compensation    OT Goals(Current goals can be found in the care plan section) Acute Rehab OT Goals Patient Stated Goal: get stronger OT Goal Formulation: With patient Time For Goal Achievement: 03/12/23 Potential to Achieve Goals: Good ADL Goals Pt Will Perform Grooming: with supervision;sitting Pt Will Perform Lower Body Dressing: with supervision;sit to/from stand;sitting/lateral leans Pt Will Transfer to Toilet: with contact guard assist Pt Will Perform Toileting - Clothing Manipulation and hygiene: with supervision;sitting/lateral leans;sit to/from stand  OT Frequency: Min 1X/week    Co-evaluation PT/OT/SLP Co-Evaluation/Treatment: Yes Reason for Co-Treatment: For patient/therapist  safety;To address functional/ADL transfers;Necessary to address cognition/behavior during functional  activity PT goals addressed during session: Mobility/safety with mobility OT goals addressed during session: ADL's and self-care      AM-PAC OT 6 Clicks Daily Activity     Outcome Measure Help from another person eating meals?: A Little Help from another person taking care of personal grooming?: A Little Help from another person toileting, which includes using toliet, bedpan, or urinal?: A Lot Help from another person bathing (including washing, rinsing, drying)?: A Lot Help from another person to put on and taking off regular upper body clothing?: A Little Help from another person to put on and taking off regular lower body clothing?: A Lot 6 Click Score: 15   End of Session Nurse Communication: Mobility status (vitals)  Activity Tolerance: Treatment limited secondary to medical complications (Comment) Patient left: in bed;with call bell/phone within reach  OT Visit Diagnosis: Other abnormalities of gait and mobility (R26.89);Unsteadiness on feet (R26.81);Muscle weakness (generalized) (M62.81)                Time: 8872-8846 OT Time Calculation (min): 26 min Charges:  OT General Charges $OT Visit: 1 Visit OT Evaluation $OT Eval Moderate Complexity: 1 Mod  Therisa Sheffield, OTD OTR/L  02/26/23, 1:59 PM

## 2023-02-26 NOTE — Assessment & Plan Note (Signed)
-   Continue Xanax -Continue BuSpar, mirtazapine, trazodone

## 2023-02-27 DIAGNOSIS — G9341 Metabolic encephalopathy: Secondary | ICD-10-CM | POA: Diagnosis not present

## 2023-02-27 DIAGNOSIS — N3000 Acute cystitis without hematuria: Secondary | ICD-10-CM | POA: Diagnosis not present

## 2023-02-27 LAB — URINE CULTURE: Culture: NO GROWTH

## 2023-02-27 LAB — VITAMIN B12: Vitamin B-12: 1333 pg/mL — ABNORMAL HIGH (ref 180–914)

## 2023-02-27 MED ORDER — ACETAMINOPHEN 325 MG PO TABS
650.0000 mg | ORAL_TABLET | ORAL | Status: DC | PRN
  Administered 2023-02-27 – 2023-03-01 (×4): 650 mg via ORAL
  Filled 2023-02-27 (×4): qty 2

## 2023-02-27 MED ORDER — CEFADROXIL 500 MG PO CAPS
1000.0000 mg | ORAL_CAPSULE | Freq: Two times a day (BID) | ORAL | Status: AC
  Administered 2023-02-27 – 2023-03-01 (×6): 1000 mg via ORAL
  Filled 2023-02-27 (×6): qty 2

## 2023-02-27 MED ORDER — ENSURE ENLIVE PO LIQD
237.0000 mL | Freq: Three times a day (TID) | ORAL | Status: DC
  Administered 2023-02-27 – 2023-03-02 (×9): 237 mL via ORAL

## 2023-02-27 MED ORDER — ADULT MULTIVITAMIN W/MINERALS CH
1.0000 | ORAL_TABLET | Freq: Every day | ORAL | Status: DC
  Administered 2023-02-27 – 2023-03-02 (×4): 1 via ORAL
  Filled 2023-02-27 (×4): qty 1

## 2023-02-27 NOTE — [place] FL2 (Signed)
   MEDICAID FL2 LEVEL OF CARE FORM     IDENTIFICATION  Patient Name: Sandra Stout Birthdate: [birth year] Sex: female Admission Date (Current Location): 02/25/2023  [facility] and [place] Number:  Chiropodist and Address:  [facility], [address], [place], [place] [address]      Provider Number: [id number]  Attending Physician Name and Address:  Patsy Lenis, MD  Relative Name and Phone Number:  Sari CHRISTELLA Shropshire  Daughter  Emergency Contact  [phone]  [address]  [place] [address]    Current Level of Care: Hospital Recommended Level of Care: Skilled Nursing Facility Prior Approval Number:    Date Approved/Denied:   PASRR Number: [id number]  Discharge Plan: SNF    Current Diagnoses: Patient Active Problem List   Diagnosis Date Noted   Acute cystitis 02/26/2023   Orthostatic hypotension 02/26/2023   Protein-calorie malnutrition, severe (HCC) 02/26/2023   AMS (altered mental status) 02/25/2023   Incidental lung nodule, greater than or equal to 8mm 05/05/2022   Vaginal prolapse 06/15/2021   Acute metabolic encephalopathy 03/09/2021   Chronic obstructive pulmonary disease (HCC) 03/09/2021   Hypokalemia 03/08/2021   Recurrent falls 10/15/2018   Moderate episode of recurrent major depressive disorder (HCC) 02/03/2018   Chronic back pain 08/17/2014   GERD (gastroesophageal reflux disease) 08/17/2014   Anxiety    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    Orientation RESPIRATION BLADDER Height & Weight     Self, Place, Time  Normal Continent Weight: 43 kg Height:  5' 2 (157.5 cm)  BEHAVIORAL SYMPTOMS/MOOD NEUROLOGICAL BOWEL NUTRITION STATUS      Continent Diet (see dc summary)  AMBULATORY STATUS COMMUNICATION OF NEEDS Skin   Extensive Assist Verbally Normal                       Personal Care Assistance Level of Assistance  Bathing, Feeding, Dressing Bathing Assistance: Limited  assistance Feeding assistance: Limited assistance Dressing Assistance: Maximum assistance     Functional Limitations Info  Sight, Hearing, Speech Sight Info: Adequate Hearing Info: Adequate Speech Info: Adequate    SPECIAL CARE FACTORS FREQUENCY  PT (By licensed PT), OT (By licensed OT)     PT Frequency: 5 times per week OT Frequency: 5 times per week            Contractures Contractures Info: Not present    Additional Factors Info  Code Status, Allergies Code Status Info: full code Allergies Info: bACLOFEN           Current Medications (02/27/2023):  This is the current hospital active medication list Current Facility-Administered Medications  Medication Dose Route Frequency Provider Last Rate Last Admin   acetaminophen  (TYLENOL ) tablet 650 mg  650 mg Oral Q4H PRN Patsy Lenis, MD   650 mg at 02/27/23 1435   ALPRAZolam  (XANAX ) tablet 0.25 mg  0.25 mg Oral BID PRN Laurita Cort DASEN, MD       busPIRone  (BUSPAR ) tablet 15 mg  15 mg Oral TID Laurita Cort T, MD   15 mg at 02/27/23 1144   cefadroxil  (DURICEF) capsule 1,000 mg  1,000 mg Oral BID Patsy Lenis, MD   1,000 mg at 02/27/23 1436   feeding supplement (ENSURE ENLIVE / ENSURE PLUS) liquid 237 mL  237 mL Oral BID BM Laurita Cort T, MD   237 mL at 02/27/23 1436   heparin  injection 5,000 Units  5,000 Units Subcutaneous Q12H Laurita Cort DASEN, MD  5,000 Units at 02/27/23 1144   methylphenidate  (RITALIN ) tablet 20 mg  20 mg Oral QID Laurita Manor T, MD   20 mg at 02/27/23 1435   mirtazapine  (REMERON ) tablet 15 mg  15 mg Oral QHS Laurita Manor T, MD   15 mg at 02/26/23 2244   ondansetron  (ZOFRAN ) tablet 4 mg  4 mg Oral Q6H PRN Laurita Manor DASEN, MD       Or   ondansetron  (ZOFRAN ) injection 4 mg  4 mg Intravenous Q6H PRN Laurita Manor T, MD       traZODone  (DESYREL ) tablet 100 mg  100 mg Oral QHS Laurita Manor T, MD   100 mg at 02/26/23 2243     Discharge Medications: Please see discharge summary for a list of discharge  medications.  Relevant Imaging Results:  Relevant Lab Results:   Additional Information ss [id number]  Royanne JINNY Bernheim, RN

## 2023-02-27 NOTE — Progress Notes (Signed)
 Initial Nutrition Assessment  DOCUMENTATION CODES:   Underweight, Severe malnutrition in context of social or environmental circumstances  INTERVENTION:   -Ensure Enlive po TID, each supplement provides 350 kcal and 20 grams of protein -MVI with minerals daily -Continue regular diet  NUTRITION DIAGNOSIS:   Severe Malnutrition related to social / environmental circumstances as evidenced by moderate fat depletion, severe fat depletion, moderate muscle depletion, severe muscle depletion, percent weight loss.  GOAL:   Patient will meet greater than or equal to 90% of their needs  MONITOR:   PO intake, Supplement acceptance  REASON FOR ASSESSMENT:   Consult Assessment of nutrition requirement/status  ASSESSMENT:   Pt with medical history significant of anxiety/depression, severe left knee OA, with chronic ambulation dysfunction who presented with altered mentation.  Pt admitted with acute cystitis, acute metabolic encephalopathy, and UTI.   Reviewed I/O's: +1.1 L x 24 hours  Per H&P, pt lives alone, but family checks in on her frequently. She had had impaired mobility secondary to recent cataract surgery.   Pt sitting up in bed at time of visit. She reports feeling better, however, unable to obtain detailed history. Pt is confused and easily distracted at time of visit. No family at bedside. Pt able to tell this RD that she is in the hospital, but fixated over possible missing items in her room and being able to call her daughter. RD offered to dial daughter's number on the hospital room phone so pt could speak with her, however, pt refused.   Pt reports she ate well this morning, but unable to tell this RD what she ate. She denies any chewing or swallowing difficulties. She is unable to provide a diet recall at this time.  Reviewed wt hx; pt has experienced a 10.2% wt loss over the past 7 months, which is significant for time frame.   Discussed importance of good meal and  supplement intake to promote healing. Pt amenable to Ensure.   Medications reviewed and include ritalin  and remeron .   Per TOC notes, plan SNF placement at discharge.   Labs reviewed.    NUTRITION - FOCUSED PHYSICAL EXAM:  Flowsheet Row Most Recent Value  Orbital Region Moderate depletion  Upper Arm Region Severe depletion  Thoracic and Lumbar Region Moderate depletion  Buccal Region Severe depletion  Temple Region Severe depletion  Clavicle Bone Region Severe depletion  Clavicle and Acromion Bone Region Severe depletion  Scapular Bone Region Severe depletion  Dorsal Hand Severe depletion  Patellar Region Moderate depletion  Anterior Thigh Region Moderate depletion  Posterior Calf Region Moderate depletion  Edema (RD Assessment) Mild  Hair Reviewed  Eyes Reviewed  Mouth Reviewed  Skin Reviewed  Nails Reviewed       Diet Order:   Diet Order             Diet regular Room service appropriate? Yes; Fluid consistency: Thin  Diet effective now                   EDUCATION NEEDS:   Education needs have been addressed  Skin:  Skin Assessment: Reviewed RN Assessment  Last BM:  02/25/23  Height:   Ht Readings from Last 1 Encounters:  02/25/23 5' 2 (1.575 m)    Weight:   Wt Readings from Last 1 Encounters:  02/25/23 43 kg    Ideal Body Weight:  50 kg  BMI:  Body mass index is 17.34 kg/m.  Estimated Nutritional Needs:   Kcal:  1700-1900  Protein:  85-100 grams  Fluid:  > 1.7 L    Margery ORN, RD, LDN, CDCES Registered Dietitian III Certified Diabetes Care and Education Specialist If unable to reach this RD, please use RD Inpatient group chat on secure chat between hours of 8am-4 pm daily

## 2023-02-27 NOTE — Plan of Care (Signed)
   Problem: Activity: Goal: Risk for activity intolerance will decrease Outcome: Progressing

## 2023-02-27 NOTE — Progress Notes (Signed)
 Progress Note    Sandra Stout   FMW:[id number]  DOB: 1945-10-02  DOA: 02/25/2023     1 PCP: Orlean Alan HERO, FNP  Initial CC: AMS  Hospital Course: Sandra Stout is a 78 y.o. female with medical history significant of anxiety/depression, severe left knee OA, with chronic ambulation dysfunction who was brought to the ER with altered mentation per family. She lives alone and is checked on frequently by family.  She has recently undergone cataract surgery. Since surgery, her activity has reduced.  She has also had some worsening weakness and fell prior to admission. Imaging studies were performed on admission showing left knee osteoarthritis with small joint effusion.  CXR unremarkable with known hypermetabolic right lower lobe nodule noted. CT head and cervical spine negative for acute abnormalities and showed underlying multilevel cervical spondylosis. Urinalysis was notable for positive nitrite, moderate LE, greater than 50 WBC.  She was started antibiotics and admitted for UTI treatment.  Interval History:  Patient about the same as yesterday. She does have a perceived component of cognitive impairment. Discussed with granddaughter this afternoon that ideally she needs to come off xanax  and weaned in order to help her evaluation and facilitate outpatient workup.   Assessment and Plan: * Acute cystitis - UA noted with positive nitrite, moderate LE, greater than 50 WBC.  Patient also had weakness and altered mentation on admission - Can transition Rocephin  to cefadroxil  and complete 5-day course - Urine culture noted with no growth.  Will plan to complete empiric course  Orthostatic hypotension - BP drop when evaluated by therapy on 1/1 - follow up repeat orthostatics - s/p IVF  Acute metabolic encephalopathy - Suspected in setting of UTI - Mentation has improved; component of anxiety appreciated and possibly some underlying cognitive impairment; ideally needs to be weaned off  xanax  and/or ritalin  outpatient before full assessment   Protein-calorie malnutrition, severe (HCC) - Patient's BMI is Body mass index is 17.34 kg/m.. - Patient has the following signs/symptoms consistent with PCM: (fat loss, muscle loss, muscle wasting).   Hypokalemia - Replete as needed  Anxiety - Continue Xanax  -Continue BuSpar , mirtazapine , trazodone    Old records reviewed in assessment of this patient  Antimicrobials: Rocephin  02/25/2023 >> 1/1 Cefadroxil  1/2 >> current   DVT prophylaxis:  heparin  injection 5,000 Units Start: 02/25/23 2200   Code Status:   Code Status: Full Code  Mobility Assessment (Last 72 Hours)     Mobility Assessment     Row Name 02/26/23 2243 02/26/23 1531 02/26/23 1352 02/26/23 1305 02/26/23 00:36:50   Does patient have an order for bedrest or is patient medically unstable No - Continue assessment No - Continue assessment -- -- No - Continue assessment   What is the highest level of mobility based on the progressive mobility assessment? Level 3 (Stands with assist) - Balance while standing  and cannot march in place Level 3 (Stands with assist) - Balance while standing  and cannot march in place Level 3 (Stands with assist) - Balance while standing  and cannot march in place Level 3 (Stands with assist) - Balance while standing  and cannot march in place Level 5 (Walks with assist in room/hall) - Balance while stepping forward/back and can walk in room with assist - Complete   Is the above level different from baseline mobility prior to current illness? Yes - Recommend PT order -- -- -- --            Barriers to discharge: none  Disposition Plan:  Home HH orders placed:  Status is: Inpt  Objective: Blood pressure 113/61, pulse (!) 104, temperature 98.3 F (36.8 C), temperature source Oral, resp. rate 16, height 5' 2 (1.575 m), weight 43 kg, SpO2 98%.  Examination:  Physical Exam Constitutional:      Appearance: Normal appearance.   HENT:     Head: Normocephalic and atraumatic.     Mouth/Throat:     Mouth: Mucous membranes are moist.  Eyes:     Extraocular Movements: Extraocular movements intact.  Cardiovascular:     Rate and Rhythm: Normal rate and regular rhythm.  Pulmonary:     Effort: Pulmonary effort is normal. No respiratory distress.     Breath sounds: Normal breath sounds. No wheezing.  Abdominal:     General: Bowel sounds are normal. There is no distension.     Palpations: Abdomen is soft.     Tenderness: There is no abdominal tenderness.  Musculoskeletal:        General: Normal range of motion.     Cervical back: Normal range of motion and neck supple.  Skin:    General: Skin is warm and dry.  Neurological:     General: No focal deficit present.     Mental Status: She is alert.  Psychiatric:        Mood and Affect: Mood normal.      Consultants:    Procedures:    Data Reviewed: Results for orders placed or performed during the hospital encounter of 02/25/23 (from the past 24 hours)  Vitamin B12     Status: Abnormal   Collection Time: 02/26/23  4:02 PM  Result Value Ref Range   Vitamin B-12 1,333 (H) 180 - 914 pg/mL    I have reviewed pertinent nursing notes, vitals, labs, and images as necessary. I have ordered labwork to follow up on as indicated.  I have reviewed the last notes from staff over past 24 hours. I have discussed patient's care plan and test results with nursing staff, CM/SW, and other staff as appropriate.  Time spent: Greater than 50% of the 55 minute visit was spent in counseling/coordination of care for the patient as laid out in the A&P.   LOS: 1 day   Alm Apo, MD [facility] 02/27/2023, 1:39 PM

## 2023-02-27 NOTE — TOC Initial Note (Signed)
 Transition of Care [facility]) - Initial/Assessment Note    Patient Details  Name: Sandra Stout MRN: [id number] Date of Birth: 1945/09/28  [facility] CM/SW Contact:    Royanne JINNY Bernheim, RN Phone Number: 02/27/2023, 4:31 PM  Clinical Narrative:              Met with the patient in the room, she is agreeable to a bedsearch, she lives at home with her daughter and has a RW at home PASSR obtained, Fl2 complete Bedsearch done, will review ootions once obtained  Expected Discharge Plan: Skilled Nursing Facility Barriers to Discharge: SNF Pending bed offer, Insurance Authorization   Patient Goals and CMS Choice            Expected Discharge Plan and Services   Discharge Planning Services: CM Consult   Living arrangements for the past 2 months: Mobile Home                   DME Agency: NA       HH Arranged: NA          Prior Living Arrangements/Services Living arrangements for the past 2 months: Mobile Home Lives with:: Self, Adult Children Patient language and need for interpreter reviewed:: Yes Do you feel safe going back to the place where you live?: Yes      Need for Family Participation in Patient Care: Yes (Comment) Care giver support system in place?: Yes (comment) Current home services: DME (rw) Criminal Activity/Legal Involvement Pertinent to Current Situation/Hospitalization: No - Comment as needed  Activities of Daily Living      Permission Sought/Granted   Permission granted to share information with : Yes, Verbal Permission Granted              Emotional Assessment Appearance:: Appears stated age Attitude/Demeanor/Rapport: Engaged Affect (typically observed): Accepting Orientation: : Oriented to Self, Oriented to Place, Oriented to  Time Alcohol / Substance Use: Not Applicable Psych Involvement: No (comment)  Admission diagnosis:  Acute cystitis [N30.00] Hypokalemia [E87.6] Delirium [R41.0] Hypotension, unspecified hypotension  type [I95.9] AMS (altered mental status) [R41.82] Patient Active Problem List   Diagnosis Date Noted   Acute cystitis 02/26/2023   Orthostatic hypotension 02/26/2023   Protein-calorie malnutrition, severe (HCC) 02/26/2023   AMS (altered mental status) 02/25/2023   Incidental lung nodule, greater than or equal to 8mm 05/05/2022   Vaginal prolapse 06/15/2021   Acute metabolic encephalopathy 03/09/2021   Chronic obstructive pulmonary disease (HCC) 03/09/2021   Hypokalemia 03/08/2021   Recurrent falls 10/15/2018   Moderate episode of recurrent major depressive disorder (HCC) 02/03/2018   Chronic back pain 08/17/2014   GERD (gastroesophageal reflux disease) 08/17/2014   Anxiety    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   PCP:  Orlean Alan HERO, FNP Pharmacy:   [facility] - [place], [place] - [address] [place] [address] Phone: [phone] Fax: [phone]     Social Drivers of Health (SDOH) Social History: SDOH Screenings   Food Insecurity: No Food Insecurity (07/11/2021)  Housing: Low Risk  (07/11/2021)  Transportation Needs: No Transportation Needs (10/01/2021)  Recent Concern: Transportation Needs - Unmet Transportation Needs (07/11/2021)  Alcohol Screen: Low Risk  (07/11/2021)  Depression (PHQ2-9): High Risk (01/02/2023)  Financial Resource Strain: Low Risk  (07/11/2021)  Physical Activity: Inactive (07/11/2021)  Social Connections: Socially Isolated (07/11/2021)  Stress: Stress Concern Present (07/11/2021)  Tobacco Use: High Risk (02/25/2023)   SDOH Interventions:     Readmission  Risk Interventions     No data to display

## 2023-02-27 NOTE — Plan of Care (Signed)

## 2023-02-28 DIAGNOSIS — N3000 Acute cystitis without hematuria: Secondary | ICD-10-CM | POA: Diagnosis not present

## 2023-02-28 LAB — BASIC METABOLIC PANEL
Anion gap: 10 (ref 5–15)
BUN: 16 mg/dL (ref 8–23)
CO2: 25 mmol/L (ref 22–32)
Calcium: 8.8 mg/dL — ABNORMAL LOW (ref 8.9–10.3)
Chloride: 103 mmol/L (ref 98–111)
Creatinine, Ser: 0.69 mg/dL (ref 0.44–1.00)
GFR, Estimated: >60 mL/min (ref >60)
Glucose, Bld: 115 mg/dL — ABNORMAL HIGH (ref 70–99)
Potassium: 3.2 mmol/L — ABNORMAL LOW (ref 3.5–5.1)
Sodium: 138 mmol/L (ref 135–145)

## 2023-02-28 LAB — CBC
HCT: 33.2 % — ABNORMAL LOW (ref 36.0–46.0)
Hemoglobin: 11 g/dL — ABNORMAL LOW (ref 12.0–15.0)
MCH: 30.4 pg (ref 26.0–34.0)
MCHC: 33.1 g/dL (ref 30.0–36.0)
MCV: 91.7 fL (ref 80.0–100.0)
Platelets: 369 10*3/uL (ref 150–400)
RBC: 3.62 MIL/uL — ABNORMAL LOW (ref 3.87–5.11)
RDW: 13.2 % (ref 11.5–15.5)
WBC: 6.5 10*3/uL (ref 4.0–10.5)
nRBC: 0 % (ref 0.0–0.2)

## 2023-02-28 LAB — MAGNESIUM: Magnesium: 1.9 mg/dL (ref 1.7–2.4)

## 2023-02-28 LAB — PHOSPHORUS: Phosphorus: 3.4 mg/dL (ref 2.5–4.6)

## 2023-02-28 MED ORDER — POTASSIUM CHLORIDE CRYS ER 20 MEQ PO TBCR
40.0000 meq | EXTENDED_RELEASE_TABLET | Freq: Once | ORAL | Status: AC
Start: 2023-02-28 — End: 2023-02-28
  Administered 2023-02-28: 40 meq via ORAL
  Filled 2023-02-28: qty 2

## 2023-02-28 NOTE — Plan of Care (Signed)
  Problem: Health Behavior/Discharge Planning: Goal: Ability to manage health-related needs will improve Outcome: Progressing   Problem: Activity: Goal: Risk for activity intolerance will decrease Outcome: Progressing   Problem: Nutrition: Goal: Adequate nutrition will be maintained Outcome: Progressing   Problem: Safety: Goal: Ability to remain free from injury will improve Outcome: Progressing

## 2023-02-28 NOTE — Progress Notes (Signed)
 Progress Note    Sandra Stout   FMW:[id number]  DOB: Mar 25, 1945  DOA: 02/25/2023     2 PCP: Orlean Alan HERO, FNP  Initial CC: AMS  Hospital Course: Sandra Stout is a 78 y.o. female with medical history significant of anxiety/depression, severe left knee OA, with chronic ambulation dysfunction who was brought to the ER with altered mentation per family. She lives alone and is checked on frequently by family.  She has recently undergone cataract surgery. Since surgery, her activity has reduced.  She has also had some worsening weakness and fell prior to admission. Imaging studies were performed on admission showing left knee osteoarthritis with small joint effusion.  CXR unremarkable with known hypermetabolic right lower lobe nodule noted. CT head and cervical spine negative for acute abnormalities and showed underlying multilevel cervical spondylosis. Urinalysis was notable for positive nitrite, moderate LE, greater than 50 WBC.  She was started antibiotics and admitted for UTI treatment.  Interval History:  No significant events overnight, patient was resting calmly in the bed and eating breakfast.  Patient is AO x 3, she is not aware why she is in the hospital.  She has chronic dysuria, treated as an outpatient but so has dysuria.  Patient denied any other complaints.  Assessment and Plan: * Acute cystitis - UA noted with positive nitrite, moderate LE, greater than 50 WBC.  Patient also had weakness and altered mentation on admission - s/p Rocephin , transition to cefadroxil  1g BID to complete 5-day course - Urine culture noted with no growth.  Will plan to complete empiric course  Orthostatic hypotension - BP drop when evaluated by therapy on 1/1 - follow up repeat orthostatics - s/p IVF  Acute metabolic encephalopathy - Suspected in setting of UTI - Mentation has improved; component of anxiety appreciated and possibly some underlying cognitive impairment; ideally needs to be  weaned off xanax  and/or ritalin  outpatient before full assessment   Protein-calorie malnutrition, severe  - Patient's BMI is Body mass index is 17.34 kg/m.. - Patient has the following signs/symptoms consistent with PCM: (fat loss, muscle loss, muscle wasting).   Hypokalemia - Replete as needed  Anxiety - Continue Xanax  -Continue BuSpar , mirtazapine , trazodone    Old records reviewed in assessment of this patient  Antimicrobials: Rocephin  02/25/2023 >> 1/1 Cefadroxil  1/2 >> current   DVT prophylaxis:  heparin  injection 5,000 Units Start: 02/25/23 2200   Code Status:   Code Status: Full Code  Mobility Assessment (Last 72 Hours)     Mobility Assessment     Row Name 02/27/23 1945 02/27/23 1330 02/26/23 2243 02/26/23 1531 02/26/23 1352   Does patient have an order for bedrest or is patient medically unstable No - Continue assessment No - Continue assessment No - Continue assessment No - Continue assessment --   What is the highest level of mobility based on the progressive mobility assessment? Level 3 (Stands with assist) - Balance while standing  and cannot march in place Level 3 (Stands with assist) - Balance while standing  and cannot march in place Level 3 (Stands with assist) - Balance while standing  and cannot march in place Level 3 (Stands with assist) - Balance while standing  and cannot march in place Level 3 (Stands with assist) - Balance while standing  and cannot march in place   Is the above level different from baseline mobility prior to current illness? Yes - Recommend PT order Yes - Recommend PT order Yes - Recommend PT order -- --  Row Name 02/26/23 1305 02/26/23 00:36:50         Does patient have an order for bedrest or is patient medically unstable -- No - Continue assessment      What is the highest level of mobility based on the progressive mobility assessment? Level 3 (Stands with assist) - Balance while standing  and cannot march in place Level 5 (Walks  with assist in room/hall) - Balance while stepping forward/back and can walk in room with assist - Complete               Barriers to discharge: none Disposition Plan:  SNF as pt lives alone, high risk for readmission due to ambulatory dysfunction. HH orders placed:  Status is: Inpt Stable to discharge to SNF, follow TOC for placement.   Objective: Blood pressure (!) 107/58, pulse (!) 107, temperature 98.1 F (36.7 C), resp. rate 16, height 5' 2 (1.575 m), weight 43 kg, SpO2 96%.  Examination:  Physical Exam Constitutional:      Appearance: Normal appearance.  HENT:     Head: Normocephalic and atraumatic.     Mouth/Throat:     Mouth: Mucous membranes are moist.  Eyes:     Extraocular Movements: Extraocular movements intact.  Cardiovascular:     Rate and Rhythm: Normal rate and regular rhythm.  Pulmonary:     Effort: Pulmonary effort is normal. No respiratory distress.     Breath sounds: Normal breath sounds. No wheezing.  Abdominal:     General: Bowel sounds are normal. There is no distension.     Palpations: Abdomen is soft.     Tenderness: There is no abdominal tenderness.  Musculoskeletal:        General: Normal range of motion.     Cervical back: Normal range of motion and neck supple.  Skin:    General: Skin is warm and dry.  Neurological:     General: No focal deficit present.     Mental Status: She is alert.  Psychiatric:        Mood and Affect: Mood normal.      Consultants:    Procedures:    Data Reviewed: Results for orders placed or performed during the hospital encounter of 02/25/23 (from the past 24 hours)  Basic metabolic panel     Status: Abnormal   Collection Time: 02/28/23  8:00 AM  Result Value Ref Range   Sodium 138 135 - 145 mmol/L   Potassium 3.2 (L) 3.5 - 5.1 mmol/L   Chloride 103 98 - 111 mmol/L   CO2 25 22 - 32 mmol/L   Glucose, Bld 115 (H) 70 - 99 mg/dL   BUN 16 8 - 23 mg/dL   Creatinine, Ser 9.30 0.44 - 1.00 mg/dL    Calcium  8.8 (L) 8.9 - 10.3 mg/dL   GFR, Estimated >39 >39 mL/min   Anion gap 10 5 - 15  CBC     Status: Abnormal   Collection Time: 02/28/23  8:00 AM  Result Value Ref Range   WBC 6.5 4.0 - 10.5 K/uL   RBC 3.62 (L) 3.87 - 5.11 MIL/uL   Hemoglobin 11.0 (L) 12.0 - 15.0 g/dL   HCT 66.7 (L) 63.9 - 53.9 %   MCV 91.7 80.0 - 100.0 fL   MCH 30.4 26.0 - 34.0 pg   MCHC 33.1 30.0 - 36.0 g/dL   RDW 86.7 88.4 - 84.4 %   Platelets 369 150 - 400 K/uL   nRBC 0.0 0.0 - 0.2 %  Magnesium      Status: None   Collection Time: 02/28/23  8:00 AM  Result Value Ref Range   Magnesium  1.9 1.7 - 2.4 mg/dL  Phosphorus     Status: None   Collection Time: 02/28/23  8:00 AM  Result Value Ref Range   Phosphorus 3.4 2.5 - 4.6 mg/dL    I have reviewed pertinent nursing notes, vitals, labs, and images as necessary. I have ordered labwork to follow up on as indicated.  I have reviewed the last notes from staff over past 24 hours. I have discussed patient's care plan and test results with nursing staff, CM/SW, and other staff as appropriate.  Time spent: Greater than 50% of the 55 minute visit was spent in counseling/coordination of care for the patient as laid out in the A&P.   LOS: 2 days   Elvan Sor, MD [facility] 02/28/2023, 12:58 PM

## 2023-02-28 NOTE — Progress Notes (Signed)
 Physical Therapy Treatment Patient Details Name: Sandra Stout MRN: [id number] DOB: 05-05-1945 Today's Date: 02/28/2023   History of Present Illness 78 y/o female presented to ED on 02/25/23 for AMS and fall. Admitted for acute metabolic encephalopathy. PMH: anxiety, nacrolepsy, COPD, depression    PT Comments  Pt seen for brief session this pm to progress mobility and OOB. Pt remains confused requiring MinA to transfer to EOB with rail from flat position. Good static sitting balance EOB. MinA to stand and steady at RW. Short distance gait in room with MinA due to unsteadiness and tremorous throughout. Pt positioned to comfort in chair. Pt remains weak with high fall risk. Initial recs for STR remain appropriate. Will continue to follow acutely per POC.   If plan is discharge home, recommend the following: A lot of help with walking and/or transfers;A lot of help with bathing/dressing/bathroom;Assistance with cooking/housework;Direct supervision/assist for medications management;Direct supervision/assist for financial management;Assist for transportation;Help with stairs or ramp for entrance;Supervision due to cognitive status   Can travel by private vehicle     Yes  Equipment Recommendations  Rolling walker (2 wheels) (YOUTH Size (?))    Recommendations for Other Services       Precautions / Restrictions Precautions Precautions: Fall Restrictions Weight Bearing Restrictions Per Provider Order: No     Mobility  Bed Mobility Overal bed mobility: Needs Assistance Bed Mobility: Supine to Sit     Supine to sit: Min assist     General bed mobility comments: Increased assist to transfer to EOB due to fatigue good static sitting balance unsupported for several minutes, no dizziness noted    Transfers Overall transfer level: Needs assistance Equipment used: 1 person hand held assist Transfers: Sit to/from Stand Sit to Stand: Min assist           General transfer comment: No  buckling of knees upon standing. Somewhat tremorous throughout    Ambulation/Gait Ambulation/Gait assistance: Min assist Gait Distance (Feet): 10 Feet Assistive device: Rolling walker (2 wheels) Gait Pattern/deviations: Decreased step length - right, Decreased step length - left, Staggering left, Staggering right, Narrow base of support Gait velocity: decr     General Gait Details: Short distance gait in room due to unsteadiness. Improved safety with use of RW.   Stairs             Wheelchair Mobility     Tilt Bed    Modified Rankin (Stroke Patients Only)       Balance Overall balance assessment: Needs assistance Sitting-balance support: No upper extremity supported, Feet unsupported Sitting balance-Leahy Scale: Fair Sitting balance - Comments: good static sitting   Standing balance support: Bilateral upper extremity supported, During functional activity Standing balance-Leahy Scale: Poor                              Cognition Arousal: Alert Behavior During Therapy: Flat affect, Impulsive Overall Cognitive Status: Impaired/Different from baseline Area of Impairment: Orientation, Attention, Memory, Safety/judgement, Awareness, Problem solving, Following commands                 Orientation Level: Disoriented to, Place, Time, Situation Current Attention Level: Sustained Memory: Decreased short-term memory Following Commands: Follows one step commands inconsistently, Follows one step commands with increased time Safety/Judgement: Decreased awareness of safety, Decreased awareness of deficits Awareness: Intellectual Problem Solving: Slow processing, Difficulty sequencing, Requires verbal cues General Comments: pt is tangential throughout  Exercises      General Comments General comments (skin integrity, edema, etc.): No edema or skin issues noted. No c/o dizziness after transferring bed to chair      Pertinent Vitals/Pain       Home Living                          Prior Function            PT Goals (current goals can now be found in the care plan section) Acute Rehab PT Goals Patient Stated Goal: did not state Progress towards PT goals: Progressing toward goals    Frequency    Min 1X/week      PT Plan      Co-evaluation              AM-PAC PT 6 Clicks Mobility   Outcome Measure  Help needed turning from your back to your side while in a flat bed without using bedrails?: A Little Help needed moving from lying on your back to sitting on the side of a flat bed without using bedrails?: A Little Help needed moving to and from a bed to a chair (including a wheelchair)?: A Lot Help needed standing up from a chair using your arms (e.g., wheelchair or bedside chair)?: A Lot Help needed to walk in hospital room?: A Lot Help needed climbing 3-5 steps with a railing? : Total 6 Click Score: 13    End of Session Equipment Utilized During Treatment: Gait belt Activity Tolerance: Patient tolerated treatment well Patient left: in chair;with call bell/phone within reach;with chair alarm set Nurse Communication: Mobility status PT Visit Diagnosis: Unsteadiness on feet (R26.81);Muscle weakness (generalized) (M62.81);Other abnormalities of gait and mobility (R26.89)     Time: 8386-8370 PT Time Calculation (min) (ACUTE ONLY): 16 min  Charges:    $Therapeutic Activity: 8-22 mins PT General Charges $$ ACUTE PT VISIT: 1 Visit                    Darice Bohr, PTA  Darice JAYSON Bohr 02/28/2023, 4:47 PM

## 2023-03-01 LAB — BASIC METABOLIC PANEL
Anion gap: 7 (ref 5–15)
BUN: 23 mg/dL (ref 8–23)
CO2: 27 mmol/L (ref 22–32)
Calcium: 8.9 mg/dL (ref 8.9–10.3)
Chloride: 107 mmol/L (ref 98–111)
Creatinine, Ser: 0.73 mg/dL (ref 0.44–1.00)
GFR, Estimated: >60 mL/min (ref >60)
Glucose, Bld: 100 mg/dL — ABNORMAL HIGH (ref 70–99)
Potassium: 3.9 mmol/L (ref 3.5–5.1)
Sodium: 141 mmol/L (ref 135–145)

## 2023-03-01 LAB — CBC
HCT: 31.2 % — ABNORMAL LOW (ref 36.0–46.0)
Hemoglobin: 10.4 g/dL — ABNORMAL LOW (ref 12.0–15.0)
MCH: 30.4 pg (ref 26.0–34.0)
MCHC: 33.3 g/dL (ref 30.0–36.0)
MCV: 91.2 fL (ref 80.0–100.0)
Platelets: 376 10*3/uL (ref 150–400)
RBC: 3.42 MIL/uL — ABNORMAL LOW (ref 3.87–5.11)
RDW: 13.4 % (ref 11.5–15.5)
WBC: 5.7 10*3/uL (ref 4.0–10.5)
nRBC: 0 % (ref 0.0–0.2)

## 2023-03-01 LAB — MAGNESIUM: Magnesium: 2.1 mg/dL (ref 1.7–2.4)

## 2023-03-01 LAB — PHOSPHORUS: Phosphorus: 3.6 mg/dL (ref 2.5–4.6)

## 2023-03-01 MED ORDER — BISACODYL 10 MG RE SUPP: 10.0000 mg | Freq: Every day | RECTAL | Status: DC | PRN

## 2023-03-01 MED ORDER — SENNOSIDES-DOCUSATE SODIUM 8.6-50 MG PO TABS
1.0000 | ORAL_TABLET | Freq: Every day | ORAL | Status: DC
  Administered 2023-03-01 – 2023-03-02 (×2): 1 via ORAL
  Filled 2023-03-01 (×2): qty 1

## 2023-03-01 NOTE — Plan of Care (Signed)
  Problem: Clinical Measurements: Goal: Ability to maintain clinical measurements within normal limits will improve Outcome: Progressing Goal: Will remain free from infection Outcome: Progressing Goal: Diagnostic test results will improve Outcome: Progressing   Problem: Activity: Goal: Risk for activity intolerance will decrease Outcome: Progressing   Problem: Coping: Goal: Level of anxiety will decrease Outcome: Progressing   

## 2023-03-01 NOTE — TOC Progression Note (Addendum)
 Transition of Care [facility]) - Progression Note    Patient Details  Name: Sandra Stout MRN: [id number] Date of Birth: [birth year]  [facility] CM/SW Contact  Racheal LITTIE Schimke, RN Phone Number: 03/01/2023, 3:10 PM  Clinical Narrative:  Per MD patient decided not to go to SNF, prefer to go home with [facility] services. CM spoke with patient and she prefers that I call Daughter Sandra Stout, right now she lives alone and Sandra Stout lives nearby, 4 trailers down. CM attempted to call daughter no answer left message.  Daughter called back agrees with discharge home with [facility].    Expected Discharge Plan: Skilled Nursing Facility Barriers to Discharge: SNF Pending bed offer, Insurance Authorization  Expected Discharge Plan and Services   Discharge Planning Services: CM Consult   Living arrangements for the past 2 months: Mobile Home                   DME Agency: NA       HH Arranged: NA           Social Determinants of Health (SDOH) Interventions SDOH Screenings   Food Insecurity: No Food Insecurity (02/27/2023)  Housing: Low Risk  (02/27/2023)  Transportation Needs: No Transportation Needs (02/27/2023)  Utilities: At Risk (02/27/2023)  Alcohol Screen: Low Risk  (07/11/2021)  Depression (PHQ2-9): High Risk (01/02/2023)  Financial Resource Strain: Low Risk  (07/11/2021)  Physical Activity: Inactive (07/11/2021)  Social Connections: Socially Isolated (02/27/2023)  Stress: Stress Concern Present (07/11/2021)  Tobacco Use: High Risk (02/25/2023)    Readmission Risk Interventions     No data to display

## 2023-03-01 NOTE — Progress Notes (Signed)
 Progress Note    Sandra Stout   FMW:[id number]  DOB: [birth year]  DOA: 02/25/2023     3 PCP: Orlean Alan HERO, FNP  Initial CC: AMS  Hospital Course: Sandra Stout is a 78 y.o. female with medical history significant of anxiety/depression, severe left knee OA, with chronic ambulation dysfunction who was brought to the ER with altered mentation per family. She lives alone and is checked on frequently by family.  She has recently undergone cataract surgery. Since surgery, her activity has reduced.  She has also had some worsening weakness and fell prior to admission. Imaging studies were performed on admission showing left knee osteoarthritis with small joint effusion.  CXR unremarkable with known hypermetabolic right lower lobe nodule noted. CT head and cervical spine negative for acute abnormalities and showed underlying multilevel cervical spondylosis. Urinalysis was notable for positive nitrite, moderate LE, greater than 50 WBC.  She was started antibiotics and admitted for UTI treatment.  Interval History:  No significant events overnight, No pain with urination. Eager to go home.   Assessment and Plan: * Acute cystitis - UA noted with positive nitrite, moderate LE, greater than 50 WBC.  Patient also had weakness and altered mentation on admission - s/p Rocephin , transition to cefadroxil  1g BID to complete 5-day course - Urine culture noted with no growth.  Will plan to complete empiric course, 1/4 is last day of antibiotics   Orthostatic hypotension - BP drop when evaluated by therapy on 1/1 - follow up repeat orthostatics - s/p IVF  Acute metabolic encephalopathy - Suspected in setting of UTI - Mentation has improved; component of anxiety appreciated and possibly some underlying cognitive impairment; ideally needs to be weaned off xanax  and/or ritalin  outpatient before full assessment   Protein-calorie malnutrition, severe  - Patient's BMI is Body mass index is 17.34  kg/m.. - Patient has the following signs/symptoms consistent with PCM: (fat loss, muscle loss, muscle wasting).   Hypokalemia - Replete as needed  Anxiety - Continue Xanax  -Continue BuSpar , mirtazapine , trazodone    Old records reviewed in assessment of this patient  Antimicrobials: Rocephin  02/25/2023 >> 1/1 Cefadroxil  1/2 >> current   DVT prophylaxis:  heparin  injection 5,000 Units Start: 02/25/23 2200   Code Status:   Code Status: Full Code  Mobility Assessment (Last 72 Hours)     Mobility Assessment     Row Name 03/01/23 0919 02/28/23 1930 02/28/23 1600 02/28/23 08:33:04 02/27/23 1945   Does patient have an order for bedrest or is patient medically unstable No - Continue assessment No - Continue assessment -- No - Continue assessment No - Continue assessment   What is the highest level of mobility based on the progressive mobility assessment? Level 4 (Walks with assist in room) - Balance while marching in place and cannot step forward and back - Complete Level 4 (Walks with assist in room) - Balance while marching in place and cannot step forward and back - Complete Level 4 (Walks with assist in room) - Balance while marching in place and cannot step forward and back - Complete Level 4 (Walks with assist in room) - Balance while marching in place and cannot step forward and back - Complete Level 3 (Stands with assist) - Balance while standing  and cannot march in place   Is the above level different from baseline mobility prior to current illness? Yes - Recommend PT order Yes - Recommend PT order -- Yes - Recommend PT order Yes - Recommend PT order  Row Name 02/27/23 1330 02/26/23 2243         Does patient have an order for bedrest or is patient medically unstable No - Continue assessment No - Continue assessment      What is the highest level of mobility based on the progressive mobility assessment? Level 3 (Stands with assist) - Balance while standing  and cannot march  in place Level 3 (Stands with assist) - Balance while standing  and cannot march in place      Is the above level different from baseline mobility prior to current illness? Yes - Recommend PT order Yes - Recommend PT order               Barriers to discharge: none Disposition Plan:  SNF as pt lives alone, high risk for readmission due to ambulatory dysfunction. Patient states that she does not want to go to SNF. Daughter agreeable for HH>  HH orders placed:  Status is: Inpt Stable to discharge, TOC for placement.   Objective: Blood pressure (!) 116/55, pulse (!) 101, temperature 97.9 F (36.6 C), resp. rate 16, height 5' 2 (1.575 m), weight 43 kg, SpO2 96%.  Examination:   Physical Exam  Constitutional: In no distress.  Cardiovascular: Normal rate, regular rhythm. No lower extremity edema  Pulmonary: Non labored breathing on room air, no wheezing or rales.  Abdominal: Soft. Normal bowel sounds. Non distended and non tender Musculoskeletal: Normal range of motion.     Neurological: Alert and oriented to person, place, and time. Non focal  Skin: Skin is warm and dry.    Consultants:    Procedures:    Data Reviewed: Results for orders placed or performed during the hospital encounter of 02/25/23 (from the past 24 hours)  Basic metabolic panel     Status: Abnormal   Collection Time: 03/01/23  4:30 AM  Result Value Ref Range   Sodium 141 135 - 145 mmol/L   Potassium 3.9 3.5 - 5.1 mmol/L   Chloride 107 98 - 111 mmol/L   CO2 27 22 - 32 mmol/L   Glucose, Bld 100 (H) 70 - 99 mg/dL   BUN 23 8 - 23 mg/dL   Creatinine, Ser 9.26 0.44 - 1.00 mg/dL   Calcium  8.9 8.9 - 10.3 mg/dL   GFR, Estimated >39 >39 mL/min   Anion gap 7 5 - 15  CBC     Status: Abnormal   Collection Time: 03/01/23  4:30 AM  Result Value Ref Range   WBC 5.7 4.0 - 10.5 K/uL   RBC 3.42 (L) 3.87 - 5.11 MIL/uL   Hemoglobin 10.4 (L) 12.0 - 15.0 g/dL   HCT 68.7 (L) 63.9 - 53.9 %   MCV 91.2 80.0 - 100.0 fL    MCH 30.4 26.0 - 34.0 pg   MCHC 33.3 30.0 - 36.0 g/dL   RDW 86.5 88.4 - 84.4 %   Platelets 376 150 - 400 K/uL   nRBC 0.0 0.0 - 0.2 %  Magnesium      Status: None   Collection Time: 03/01/23  4:30 AM  Result Value Ref Range   Magnesium  2.1 1.7 - 2.4 mg/dL  Phosphorus     Status: None   Collection Time: 03/01/23  4:30 AM  Result Value Ref Range   Phosphorus 3.6 2.5 - 4.6 mg/dL    I have reviewed pertinent nursing notes, vitals, labs, and images as necessary. I have ordered labwork to follow up on as indicated.  I have reviewed the  last notes from staff over past 24 hours. I have discussed patient's care plan and test results with nursing staff, CM/SW, and other staff as appropriate.  Time spent: 40 minutes .   LOS: 3 days   Alban Pepper, MD [facility] 03/01/2023, 7:43 PM

## 2023-03-02 LAB — CBC
HCT: 32.9 % — ABNORMAL LOW (ref 36.0–46.0)
Hemoglobin: 10.8 g/dL — ABNORMAL LOW (ref 12.0–15.0)
MCH: 30.5 pg (ref 26.0–34.0)
MCHC: 32.8 g/dL (ref 30.0–36.0)
MCV: 92.9 fL (ref 80.0–100.0)
Platelets: 424 10*3/uL — ABNORMAL HIGH (ref 150–400)
RBC: 3.54 MIL/uL — ABNORMAL LOW (ref 3.87–5.11)
RDW: 13.5 % (ref 11.5–15.5)
WBC: 5.9 10*3/uL (ref 4.0–10.5)
nRBC: 0 % (ref 0.0–0.2)

## 2023-03-02 LAB — CULTURE, BLOOD (ROUTINE X 2)
Culture: NO GROWTH
Culture: NO GROWTH
Special Requests: ADEQUATE

## 2023-03-02 LAB — BASIC METABOLIC PANEL
Anion gap: 9 (ref 5–15)
BUN: 18 mg/dL (ref 8–23)
CO2: 27 mmol/L (ref 22–32)
Calcium: 8.9 mg/dL (ref 8.9–10.3)
Chloride: 103 mmol/L (ref 98–111)
Creatinine, Ser: 0.66 mg/dL (ref 0.44–1.00)
GFR, Estimated: >60 mL/min (ref >60)
Glucose, Bld: 90 mg/dL (ref 70–99)
Potassium: 4.4 mmol/L (ref 3.5–5.1)
Sodium: 139 mmol/L (ref 135–145)

## 2023-03-02 LAB — IRON AND TIBC
Iron: 57 ug/dL (ref 28–170)
Saturation Ratios: 27 % (ref 10.4–31.8)
TIBC: 214 ug/dL — ABNORMAL LOW (ref 250–450)
UIBC: 157 ug/dL

## 2023-03-02 LAB — MAGNESIUM: Magnesium: 2.2 mg/dL (ref 1.7–2.4)

## 2023-03-02 LAB — FERRITIN: Ferritin: 130 ng/mL (ref 11–307)

## 2023-03-02 MED ORDER — SENNOSIDES-DOCUSATE SODIUM 8.6-50 MG PO TABS: 1.0000 | ORAL_TABLET | Freq: Every day | ORAL | Status: DC

## 2023-03-02 MED ORDER — POLYETHYLENE GLYCOL 3350 17 G PO PACK: 17.0000 g | PACK | Freq: Every day | ORAL | Status: DC | PRN

## 2023-03-02 NOTE — Plan of Care (Signed)
  Problem: Education: Goal: Knowledge of General Education information will improve Description: Including pain rating scale, medication(s)/side effects and non-pharmacologic comfort measures Outcome: Adequate for Discharge   Problem: Health Behavior/Discharge Planning: Goal: Ability to manage health-related needs will improve Outcome: Adequate for Discharge   Problem: Clinical Measurements: Goal: Ability to maintain clinical measurements within normal limits will improve Outcome: Adequate for Discharge Goal: Diagnostic test results will improve Outcome: Adequate for Discharge   Problem: Activity: Goal: Risk for activity intolerance will decrease Outcome: Adequate for Discharge   Problem: Elimination: Goal: Will not experience complications related to bowel motility Outcome: Adequate for Discharge

## 2023-03-02 NOTE — Discharge Summary (Signed)
 Physician Discharge Summary   Patient: Sandra Stout MRN: [id number] DOB: Nov 26, 1945  Admit date:     02/25/2023  Discharge date: 03/02/23  Discharge Physician: Alban Pepper   PCP: Orlean Alan HERO, FNP   Recommendations at discharge:    None  Discharge Diagnoses: Principal Problem:   Acute cystitis Active Problems:   Acute metabolic encephalopathy   Orthostatic hypotension   Anxiety   Hypokalemia   Protein-calorie malnutrition, severe (HCC)  Resolved Problems:   * No resolved hospital problems. *  Hospital Course: Sandra Stout is a 78 y.o. female with medical history significant of anxiety/depression, severe left knee OA, with chronic ambulation dysfunction who was brought to the ER with altered mentation per family. She lives alone and is checked on frequently by family.  She has recently undergone cataract surgery. Since surgery, her activity has reduced.  She has also had some worsening weakness and fell prior to admission. Imaging studies were performed on admission showing left knee osteoarthritis with small joint effusion.  CXR unremarkable with known hypermetabolic right lower lobe nodule noted. CT head and cervical spine negative for acute abnormalities and showed underlying multilevel cervical spondylosis. Urinalysis was notable for positive nitrite, moderate LE, greater than 50 WBC.  She was started antibiotics and admitted for UTI treatment.  Assessment and Plan: Acute cystitis- Resolved  - UA noted with positive nitrite, moderate LE, greater than 50 WBC.  Patient also had weakness and altered mentation on admission - Urine culture noted with no growth. - s/p Rocephin , transitioned to cefadroxil  1g BID and completed 5-day course 1/4    Orthostatic hypotension - BP drop when evaluated by therapy on 1/1 -Repeat orthostatics,, borderline but negative -Encourage patient to hydrate  Acute metabolic encephalopathy resolved - Suspected in setting of UTI -  Mentation back to baseline; component of anxiety and possibly underlying cognitive impairment; ideally needs to be weaned off xanax  and/or ritalin  outpatient before full assessment   Protein-calorie malnutrition, severe (HCC) - Patient's BMI is Body mass index is 17.34 kg/m.. - Patient has the following signs/symptoms consistent with PCM: (fat loss, muscle loss, muscle wasting).  Anxiety - Continue Xanax  -Continue BuSpar , mirtazapine , trazodone   Antimicrobials: Rocephin  02/25/2023 >> 1/1 Cefadroxil  1/2 >> 1/4      Pain control - [place]  Controlled Substance Reporting System database was reviewed. and patient was instructed, not to drive, operate heavy machinery, perform activities at heights, swimming or participation in water activities or provide baby-sitting services while on Pain, Sleep and Anxiety Medications; until their outpatient Physician has advised to do so again. Also recommended to not to take more than prescribed Pain, Sleep and Anxiety Medications.  Consultants: None Procedures performed: None Disposition: Home health Diet recommendation:  Regular diet DISCHARGE MEDICATION: Allergies as of 03/02/2023       Reactions   Baclofen         Medication List     STOP taking these medications    traZODone  50 MG tablet Commonly known as: DESYREL        TAKE these medications    ALPRAZolam  1 MG tablet Commonly known as: XANAX  Take 1 mg by mouth 2 (two) times daily as needed for anxiety.   Balanced B-50 Tabs Take 1 tablet by mouth daily.   busPIRone  15 MG tablet Commonly known as: BUSPAR  Take 15 mg by mouth 3 (three) times daily.   cholecalciferol 25 MCG (1000 UNIT) tablet Commonly known as: VITAMIN D3 Take 1,000 Units by mouth daily.   co-enzyme  Q-10 50 MG capsule Take 50 mg by mouth daily.   Fish Oil 1000 MG Caps Take by mouth.   methylphenidate  20 MG tablet Commonly known as: RITALIN  Take 3-4 tablets by mouth. Per day in divided doses    mirtazapine  30 MG tablet Commonly known as: REMERON  Take 15 mg by mouth at bedtime.   polyethylene glycol 17 g packet Commonly known as: MiraLax  Take 17 g by mouth daily as needed for mild constipation.   senna-docusate 8.6-50 MG tablet Commonly known as: Senokot-S Take 1 tablet by mouth daily. Start taking on: March 03, 2023        Discharge Exam: Fredricka Weights   02/25/23 1240  Weight: 43 kg   Physical Exam  Constitutional: In no distress.  Cardiovascular: Normal rate, regular rhythm. No lower extremity edema  Pulmonary: Non labored breathing on room air, no wheezing or rales.   Abdominal: Soft. Normal bowel sounds. Non distended and non tender Musculoskeletal: Normal range of motion.     Neurological: Alert and oriented to person, place, and time. Non focal  Skin: Skin is warm and dry.    Condition at discharge: good  The results of significant diagnostics from this hospitalization (including imaging, microbiology, ancillary and laboratory) are listed below for reference.   Imaging Studies: DG Knee 1-2 Views Left Result Date: 02/25/2023 CLINICAL DATA:  Left knee pain. EXAM: LEFT KNEE - 1-2 VIEW COMPARISON:  None Available. FINDINGS: The bones are subjectively under mineralized. No fracture. The knee is held in flexion on the lateral view, otherwise normal alignment. Minor peripheral spurring. Moderate chondrocalcinosis. There is a small knee joint effusion. No erosive or bony destructive change. No focal soft tissue abnormalities. IMPRESSION: Mild osteoarthritis with chondrocalcinosis and small joint effusion. Electronically Signed   By: Andrea Gasman M.D.   On: 02/25/2023 18:35   DG Chest 2 View Result Date: 02/25/2023 CLINICAL DATA:  Altered mental status. EXAM: CHEST - 2 VIEW COMPARISON:  PET-CT dated August 08, 2022. Chest radiograph dated March 07, 2021. FINDINGS: The heart size and mediastinal contours are within normal limits. The known hypermetabolic  irregular anterior right lower lobe nodule is not well visualized on this exam. No focal consolidation, pleural effusion, or pneumothorax. No acute osseous abnormality. IMPRESSION: 1. No acute cardiopulmonary findings. 2. The known hypermetabolic irregular anterior right lower lobe nodule is not well visualized on this exam and is better evaluated on the prior PET-CT dated August 08, 2022. Electronically Signed   By: Harrietta Sherry M.D.   On: 02/25/2023 15:54   CT Head Wo Contrast Result Date: 02/25/2023 CLINICAL DATA:  Neuro deficit, acute, stroke suspected; Facial trauma, blunt. EXAM: CT HEAD WITHOUT CONTRAST CT CERVICAL SPINE WITHOUT CONTRAST TECHNIQUE: Multidetector CT imaging of the head and cervical spine was performed following the standard protocol without intravenous contrast. Multiplanar CT image reconstructions of the cervical spine were also generated. RADIATION DOSE REDUCTION: This exam was performed according to the departmental dose-optimization program which includes automated exposure control, adjustment of the mA and/or kV according to patient size and/or use of iterative reconstruction technique. COMPARISON:  Head CT 02/03/2014.  MRI brain 03/10/2021. FINDINGS: CT HEAD FINDINGS Brain: No acute hemorrhage. Unchanged mild chronic small-vessel disease. Cortical gray-white differentiation is otherwise preserved. Prominence of the ventricles and sulci within expected range for age. No hydrocephalus or extra-axial collection. No mass effect or midline shift. Vascular: No hyperdense vessel or unexpected calcification. Skull: No calvarial fracture or suspicious bone lesion. Skull base is unremarkable. Sinuses/Orbits: No  acute finding. Other: None. CT CERVICAL SPINE FINDINGS Alignment: 3 mm degenerative anterolisthesis of C3 on C4 and C4 on C5. 3 mm degenerative retrolisthesis of C5 on C6. No traumatic malalignment. Skull base and vertebrae: No acute fracture. Moderate degenerative changes of the C1-2  articulation with mild degenerative pannus. Soft tissues and spinal canal: No prevertebral fluid or swelling. No visible canal hematoma. Disc levels: Multilevel cervical spondylosis, worst at C4-5 and C5-6, where there is at least mild spinal canal stenosis. Upper chest: No acute findings. Other: Atherosclerotic calcifications of the carotid bulbs. IMPRESSION: 1. No acute intracranial abnormality. 2. No acute cervical spine fracture or traumatic malalignment. 3. Multilevel cervical spondylosis, worst at C4-5 and C5-6, where there is at least mild spinal canal stenosis. Electronically Signed   By: Ryan Chess M.D.   On: 02/25/2023 15:49   CT Cervical Spine Wo Contrast Result Date: 02/25/2023 CLINICAL DATA:  Neuro deficit, acute, stroke suspected; Facial trauma, blunt. EXAM: CT HEAD WITHOUT CONTRAST CT CERVICAL SPINE WITHOUT CONTRAST TECHNIQUE: Multidetector CT imaging of the head and cervical spine was performed following the standard protocol without intravenous contrast. Multiplanar CT image reconstructions of the cervical spine were also generated. RADIATION DOSE REDUCTION: This exam was performed according to the departmental dose-optimization program which includes automated exposure control, adjustment of the mA and/or kV according to patient size and/or use of iterative reconstruction technique. COMPARISON:  Head CT 02/03/2014.  MRI brain 03/10/2021. FINDINGS: CT HEAD FINDINGS Brain: No acute hemorrhage. Unchanged mild chronic small-vessel disease. Cortical gray-white differentiation is otherwise preserved. Prominence of the ventricles and sulci within expected range for age. No hydrocephalus or extra-axial collection. No mass effect or midline shift. Vascular: No hyperdense vessel or unexpected calcification. Skull: No calvarial fracture or suspicious bone lesion. Skull base is unremarkable. Sinuses/Orbits: No acute finding. Other: None. CT CERVICAL SPINE FINDINGS Alignment: 3 mm degenerative  anterolisthesis of C3 on C4 and C4 on C5. 3 mm degenerative retrolisthesis of C5 on C6. No traumatic malalignment. Skull base and vertebrae: No acute fracture. Moderate degenerative changes of the C1-2 articulation with mild degenerative pannus. Soft tissues and spinal canal: No prevertebral fluid or swelling. No visible canal hematoma. Disc levels: Multilevel cervical spondylosis, worst at C4-5 and C5-6, where there is at least mild spinal canal stenosis. Upper chest: No acute findings. Other: Atherosclerotic calcifications of the carotid bulbs. IMPRESSION: 1. No acute intracranial abnormality. 2. No acute cervical spine fracture or traumatic malalignment. 3. Multilevel cervical spondylosis, worst at C4-5 and C5-6, where there is at least mild spinal canal stenosis. Electronically Signed   By: Ryan Chess M.D.   On: 02/25/2023 15:49    Microbiology: Results for orders placed or performed during the hospital encounter of 02/25/23  Culture, blood (Routine x 2)     Status: None   Collection Time: 02/25/23 12:47 PM   Specimen: BLOOD  Result Value Ref Range Status   Specimen Description BLOOD LEFT ANTECUBITAL  Final   Special Requests   Final    BOTTLES DRAWN AEROBIC AND ANAEROBIC Blood Culture results may not be optimal due to an inadequate volume of blood received in culture bottles   Culture   Final    NO GROWTH 5 DAYS Performed at [facility], [address], [place], [place] [address]    Report Status 03/02/2023 FINAL  Final  Culture, blood (Routine x 2)     Status: None   Collection Time: 02/25/23  1:08 PM   Specimen: BLOOD  Result Value  Ref Range Status   Specimen Description BLOOD RIGHT ANTECUBITAL  Final   Special Requests   Final    BOTTLES DRAWN AEROBIC AND ANAEROBIC Blood Culture adequate volume   Culture   Final    NO GROWTH 5 DAYS Performed at [facility], [address] [place], [place] [address]    Report Status 03/02/2023 FINAL  Final  Resp panel  by RT-PCR (RSV, Flu A&B, Covid) Anterior Nasal Swab     Status: None   Collection Time: 02/25/23  4:51 PM   Specimen: Anterior Nasal Swab  Result Value Ref Range Status   SARS Coronavirus 2 by RT PCR NEGATIVE NEGATIVE Final    Comment: (NOTE) SARS-CoV-2 target nucleic acids are NOT DETECTED.  The SARS-CoV-2 RNA is generally detectable in upper respiratory specimens during the acute phase of infection. The lowest concentration of SARS-CoV-2 viral copies this assay can detect is 138 copies/mL. A negative result does not preclude SARS-Cov-2 infection and should not be used as the sole basis for treatment or other patient management decisions. A negative result may occur with  improper specimen collection/handling, submission of specimen other than nasopharyngeal swab, presence of viral mutation(s) within the areas targeted by this assay, and inadequate number of viral copies(<138 copies/mL). A negative result must be combined with clinical observations, patient history, and epidemiological information. The expected result is Negative.  Fact Sheet for Patients:  bloggercourse.com  Fact Sheet for Healthcare Providers:  seriousbroker.it  This test is no t yet approved or cleared by the [place]  FDA and  has been authorized for detection and/or diagnosis of SARS-CoV-2 by FDA under an Emergency Use Authorization (EUA). This EUA will remain  in effect (meaning this test can be used) for the duration of the COVID-19 declaration under Section 564(b)(1) of the Act, 21 U.S.C.section 360bbb-3(b)(1), unless the authorization is terminated  or revoked sooner.       Influenza A by PCR NEGATIVE NEGATIVE Final   Influenza B by PCR NEGATIVE NEGATIVE Final    Comment: (NOTE) The Xpert Xpress SARS-CoV-2/FLU/RSV plus assay is intended as an aid in the diagnosis of influenza from Nasopharyngeal swab specimens and should not be used as a sole  basis for treatment. Nasal washings and aspirates are unacceptable for Xpert Xpress SARS-CoV-2/FLU/RSV testing.  Fact Sheet for Patients: bloggercourse.com  Fact Sheet for Healthcare Providers: seriousbroker.it  This test is not yet approved or cleared by the [place]  FDA and has been authorized for detection and/or diagnosis of SARS-CoV-2 by FDA under an Emergency Use Authorization (EUA). This EUA will remain in effect (meaning this test can be used) for the duration of the COVID-19 declaration under Section 564(b)(1) of the Act, 21 U.S.C. section 360bbb-3(b)(1), unless the authorization is terminated or revoked.     Resp Syncytial Virus by PCR NEGATIVE NEGATIVE Final    Comment: (NOTE) Fact Sheet for Patients: bloggercourse.com  Fact Sheet for Healthcare Providers: seriousbroker.it  This test is not yet approved or cleared by the [place]  FDA and has been authorized for detection and/or diagnosis of SARS-CoV-2 by FDA under an Emergency Use Authorization (EUA). This EUA will remain in effect (meaning this test can be used) for the duration of the COVID-19 declaration under Section 564(b)(1) of the Act, 21 U.S.C. section 360bbb-3(b)(1), unless the authorization is terminated or revoked.  Performed at [facility], [address] [place], [place] [address]   Urine Culture     Status: None   Collection Time: 02/25/23 11:06  PM   Specimen: Urine, Random  Result Value Ref Range Status   Specimen Description   Final    URINE, RANDOM Performed at [facility], [address], [place], [place] [address]    Special Requests   Final    NONE Reflexed from [id number] Performed at [facility], [address] [place], [place] [address]    Culture   Final    NO GROWTH Performed at [facility], [address], [place], [place] [address]     Report Status 02/27/2023 FINAL  Final    Labs: CBC: Recent Labs  Lab 02/25/23 1247 02/28/23 0800 03/01/23 0430 03/02/23 0600  WBC 8.5 6.5 5.7 5.9  NEUTROABS 6.3  --   --   --   HGB 11.8* 11.0* 10.4* 10.8*  HCT 35.2* 33.2* 31.2* 32.9*  MCV 94.6 91.7 91.2 92.9  PLT 277 369 376 424*   Basic Metabolic Panel: Recent Labs  Lab 02/25/23 1247 02/25/23 1400 02/26/23 0209 02/28/23 0800 03/01/23 0430 03/02/23 0600  NA  --  138  --  138 141 139  K  --  2.7* 4.1 3.2* 3.9 4.4  CL  --  103  --  103 107 103  CO2  --  23  --  25 27 27   GLUCOSE  --  107*  --  115* 100* 90  BUN  --  18  --  16 23 18   CREATININE  --  0.67  --  0.69 0.73 0.66  CALCIUM   --  8.5*  --  8.8* 8.9 8.9  MG 1.9  --   --  1.9 2.1 2.2  PHOS 2.0*  --   --  3.4 3.6  --    Liver Function Tests: Recent Labs  Lab 02/25/23 1400  AST 13*  ALT 6  ALKPHOS 56  BILITOT 0.6  PROT 6.3*  ALBUMIN 3.1*   CBG: No results for input(s): GLUCAP in the last 168 hours.  Discharge time spent: greater than 30 minutes.  Signed: Alban Pepper, MD [facility] 03/02/2023

## 2023-03-02 NOTE — Discharge Instructions (Addendum)
 Continue to hydrate to help ensure you do not get dizzy with standing.   Take over the counter miralax to help with constipation.

## 2023-03-02 NOTE — Progress Notes (Signed)
 Progress Note    Sandra Stout   FMW:[id number]  DOB: [birth year]  DOA: 02/25/2023     4 PCP: Orlean Alan HERO, FNP  Initial CC: AMS  Hospital Course: Sandra Stout is a 78 y.o. female with medical history significant of anxiety/depression, severe left knee OA, with chronic ambulation dysfunction who was brought to the ER with altered mentation per family. She lives alone and is checked on frequently by family.  She has recently undergone cataract surgery. Since surgery, her activity has reduced.  She has also had some worsening weakness and fell prior to admission. Imaging studies were performed on admission showing left knee osteoarthritis with small joint effusion.  CXR unremarkable with known hypermetabolic right lower lobe nodule noted. CT head and cervical spine negative for acute abnormalities and showed underlying multilevel cervical spondylosis. Urinalysis was notable for positive nitrite, moderate LE, greater than 50 WBC.  She was started antibiotics and admitted for UTI treatment.  She is medially stable for discharge.   Interval History:  No significant events overnight.  Eager to get home.   Assessment and Plan: Acute cystitis - UA noted with positive nitrite, moderate LE, greater than 50 WBC.  Patient also had weakness and altered mentation on admission - Urine culture noted with no growth. - s/p Rocephin , transitioned to cefadroxil  1g BID and completed 5-day course 1/4  Orthostatic hypotension - Orthostatic when evaluated by therapy on 1/1 - s/p IVF - follow up repeat orthostatics  Acute metabolic encephalopathy (Resolved) - Suspected in setting of UTI - Mentation back to baseline; component of anxiety and possibly underlying cognitive impairment; ideally needs to be weaned off xanax  and/or ritalin  outpatient before full assessment   Protein-calorie malnutrition, severe  - Patient's BMI is Body mass index is 17.34 kg/m.. - Patient has the following  signs/symptoms consistent with PCM: (fat loss, muscle loss, muscle wasting).  Hypokalemia - Replete as needed  Anxiety -Continue Xanax  -Continue BuSpar , mirtazapine , trazodone   Antimicrobials: Rocephin  02/25/2023 >> 1/1 Cefadroxil  1/2 >> 1/4  DVT prophylaxis:  heparin  injection 5,000 Units Start: 02/25/23 2200   Code Status:   Code Status: Full Code  Mobility Assessment (Last 72 Hours)     Mobility Assessment     Row Name 03/01/23 2015 03/01/23 0919 02/28/23 1930 02/28/23 1600 02/28/23 08:33:04   Does patient have an order for bedrest or is patient medically unstable No - Continue assessment No - Continue assessment No - Continue assessment -- No - Continue assessment   What is the highest level of mobility based on the progressive mobility assessment? Level 4 (Walks with assist in room) - Balance while marching in place and cannot step forward and back - Complete Level 4 (Walks with assist in room) - Balance while marching in place and cannot step forward and back - Complete Level 4 (Walks with assist in room) - Balance while marching in place and cannot step forward and back - Complete Level 4 (Walks with assist in room) - Balance while marching in place and cannot step forward and back - Complete Level 4 (Walks with assist in room) - Balance while marching in place and cannot step forward and back - Complete   Is the above level different from baseline mobility prior to current illness? Yes - Recommend PT order Yes - Recommend PT order Yes - Recommend PT order -- Yes - Recommend PT order    Row Name 02/27/23 1945           Does  patient have an order for bedrest or is patient medically unstable No - Continue assessment       What is the highest level of mobility based on the progressive mobility assessment? Level 3 (Stands with assist) - Balance while standing  and cannot march in place       Is the above level different from baseline mobility prior to current illness? Yes -  Recommend PT order                Barriers to discharge: none Disposition Plan:  SNF as pt lives alone, high risk for readmission due to ambulatory dysfunction. Patient states that she does not want to go to SNF. Daughter agreeable for [facility]  HH orders placed:  Status is: Inpt awaiting safe discharge, family unable to pick patient up from hsopital  Stable to discharge, TOC for placement.   Objective: Blood pressure (!) 119/55, pulse 95, temperature 98.3 F (36.8 C), temperature source Oral, resp. rate 18, height 5' 2 (1.575 m), weight 43 kg, SpO2 95%.  Examination:  Physical Exam  Constitutional: In no distress. Thin  Cardiovascular: Normal rate, regular rhythm. No lower extremity edema  Pulmonary: Non labored breathing on room air, no wheezing or rales.   Abdominal: Soft. Normal bowel sounds. Non distended and non tender Musculoskeletal: Normal range of motion.     Neurological: Alert and oriented to person, place, and time. Non focal  Skin: Skin is warm and dry.   Consultants:  None   Procedures:  None   Data Reviewed: Results for orders placed or performed during the hospital encounter of 02/25/23 (from the past 24 hours)  Basic metabolic panel     Status: None   Collection Time: 03/02/23  6:00 AM  Result Value Ref Range   Sodium 139 135 - 145 mmol/L   Potassium 4.4 3.5 - 5.1 mmol/L   Chloride 103 98 - 111 mmol/L   CO2 27 22 - 32 mmol/L   Glucose, Bld 90 70 - 99 mg/dL   BUN 18 8 - 23 mg/dL   Creatinine, Ser 9.33 0.44 - 1.00 mg/dL   Calcium  8.9 8.9 - 10.3 mg/dL   GFR, Estimated >39 >39 mL/min   Anion gap 9 5 - 15  CBC     Status: Abnormal   Collection Time: 03/02/23  6:00 AM  Result Value Ref Range   WBC 5.9 4.0 - 10.5 K/uL   RBC 3.54 (L) 3.87 - 5.11 MIL/uL   Hemoglobin 10.8 (L) 12.0 - 15.0 g/dL   HCT 67.0 (L) 63.9 - 53.9 %   MCV 92.9 80.0 - 100.0 fL   MCH 30.5 26.0 - 34.0 pg   MCHC 32.8 30.0 - 36.0 g/dL   RDW 86.4 88.4 - 84.4 %   Platelets 424 (H) 150 -  400 K/uL   nRBC 0.0 0.0 - 0.2 %  Magnesium      Status: None   Collection Time: 03/02/23  6:00 AM  Result Value Ref Range   Magnesium  2.2 1.7 - 2.4 mg/dL  Ferritin     Status: None   Collection Time: 03/02/23  6:00 AM  Result Value Ref Range   Ferritin 130 11 - 307 ng/mL  Iron and TIBC     Status: Abnormal   Collection Time: 03/02/23  6:00 AM  Result Value Ref Range   Iron 57 28 - 170 ug/dL   TIBC 785 (L) 749 - 549 ug/dL   Saturation Ratios 27 10.4 - 31.8 %  UIBC 157 ug/dL    I have reviewed pertinent nursing notes, vitals, labs, and images as necessary. I have ordered labwork to follow up on as indicated.  I have reviewed the last notes from staff over past 24 hours.   Time spent: 35 minutes .   LOS: 4 days   Alban Pepper, MD [facility] 03/02/2023, 2:08 PM

## 2023-03-02 NOTE — Plan of Care (Signed)
  Problem: Health Behavior/Discharge Planning: Goal: Ability to manage health-related needs will improve Outcome: Progressing   Problem: Clinical Measurements: Goal: Will remain free from infection Outcome: Progressing Goal: Diagnostic test results will improve Outcome: Progressing   Problem: Activity: Goal: Risk for activity intolerance will decrease Outcome: Progressing   Problem: Nutrition: Goal: Adequate nutrition will be maintained Outcome: Progressing   Problem: Coping: Goal: Level of anxiety will decrease Outcome: Progressing   Problem: Safety: Goal: Ability to remain free from injury will improve Outcome: Progressing

## 2023-03-04 DIAGNOSIS — J449 Chronic obstructive pulmonary disease, unspecified: Secondary | ICD-10-CM | POA: Diagnosis not present

## 2023-03-04 DIAGNOSIS — I951 Orthostatic hypotension: Secondary | ICD-10-CM | POA: Diagnosis not present

## 2023-03-04 DIAGNOSIS — G9341 Metabolic encephalopathy: Secondary | ICD-10-CM | POA: Diagnosis not present

## 2023-03-04 DIAGNOSIS — R911 Solitary pulmonary nodule: Secondary | ICD-10-CM | POA: Diagnosis not present

## 2023-03-04 DIAGNOSIS — E43 Unspecified severe protein-calorie malnutrition: Secondary | ICD-10-CM | POA: Diagnosis not present

## 2023-03-04 DIAGNOSIS — G8929 Other chronic pain: Secondary | ICD-10-CM | POA: Diagnosis not present

## 2023-03-04 DIAGNOSIS — M1712 Unilateral primary osteoarthritis, left knee: Secondary | ICD-10-CM | POA: Diagnosis not present

## 2023-03-04 DIAGNOSIS — E876 Hypokalemia: Secondary | ICD-10-CM | POA: Diagnosis not present

## 2023-03-04 DIAGNOSIS — I2510 Atherosclerotic heart disease of native coronary artery without angina pectoris: Secondary | ICD-10-CM | POA: Diagnosis not present

## 2023-03-04 DIAGNOSIS — N8110 Cystocele, unspecified: Secondary | ICD-10-CM | POA: Diagnosis not present

## 2023-03-04 DIAGNOSIS — K219 Gastro-esophageal reflux disease without esophagitis: Secondary | ICD-10-CM | POA: Diagnosis not present

## 2023-03-04 DIAGNOSIS — M47812 Spondylosis without myelopathy or radiculopathy, cervical region: Secondary | ICD-10-CM | POA: Diagnosis not present

## 2023-03-04 DIAGNOSIS — E785 Hyperlipidemia, unspecified: Secondary | ICD-10-CM | POA: Diagnosis not present

## 2023-03-04 DIAGNOSIS — G47419 Narcolepsy without cataplexy: Secondary | ICD-10-CM | POA: Diagnosis not present

## 2023-03-04 DIAGNOSIS — Z9181 History of falling: Secondary | ICD-10-CM | POA: Diagnosis not present

## 2023-03-04 DIAGNOSIS — M549 Dorsalgia, unspecified: Secondary | ICD-10-CM | POA: Diagnosis not present

## 2023-03-04 DIAGNOSIS — N3000 Acute cystitis without hematuria: Secondary | ICD-10-CM | POA: Diagnosis not present

## 2023-03-07 DIAGNOSIS — M549 Dorsalgia, unspecified: Secondary | ICD-10-CM | POA: Diagnosis not present

## 2023-03-07 DIAGNOSIS — G47419 Narcolepsy without cataplexy: Secondary | ICD-10-CM | POA: Diagnosis not present

## 2023-03-07 DIAGNOSIS — M47812 Spondylosis without myelopathy or radiculopathy, cervical region: Secondary | ICD-10-CM | POA: Diagnosis not present

## 2023-03-07 DIAGNOSIS — J449 Chronic obstructive pulmonary disease, unspecified: Secondary | ICD-10-CM | POA: Diagnosis not present

## 2023-03-07 DIAGNOSIS — Z9181 History of falling: Secondary | ICD-10-CM | POA: Diagnosis not present

## 2023-03-07 DIAGNOSIS — R911 Solitary pulmonary nodule: Secondary | ICD-10-CM | POA: Diagnosis not present

## 2023-03-07 DIAGNOSIS — G8929 Other chronic pain: Secondary | ICD-10-CM | POA: Diagnosis not present

## 2023-03-07 DIAGNOSIS — M1712 Unilateral primary osteoarthritis, left knee: Secondary | ICD-10-CM | POA: Diagnosis not present

## 2023-03-07 DIAGNOSIS — I2510 Atherosclerotic heart disease of native coronary artery without angina pectoris: Secondary | ICD-10-CM | POA: Diagnosis not present

## 2023-03-07 DIAGNOSIS — E43 Unspecified severe protein-calorie malnutrition: Secondary | ICD-10-CM | POA: Diagnosis not present

## 2023-03-07 DIAGNOSIS — E785 Hyperlipidemia, unspecified: Secondary | ICD-10-CM | POA: Diagnosis not present

## 2023-03-07 DIAGNOSIS — I951 Orthostatic hypotension: Secondary | ICD-10-CM | POA: Diagnosis not present

## 2023-03-07 DIAGNOSIS — N3000 Acute cystitis without hematuria: Secondary | ICD-10-CM | POA: Diagnosis not present

## 2023-03-07 DIAGNOSIS — G9341 Metabolic encephalopathy: Secondary | ICD-10-CM | POA: Diagnosis not present

## 2023-03-07 DIAGNOSIS — N8110 Cystocele, unspecified: Secondary | ICD-10-CM | POA: Diagnosis not present

## 2023-03-07 DIAGNOSIS — E876 Hypokalemia: Secondary | ICD-10-CM | POA: Diagnosis not present

## 2023-03-07 DIAGNOSIS — K219 Gastro-esophageal reflux disease without esophagitis: Secondary | ICD-10-CM | POA: Diagnosis not present

## 2023-03-10 ENCOUNTER — Telehealth: Payer: Self-pay | Admitting: Family

## 2023-03-10 NOTE — Telephone Encounter (Signed)
 Tiffany, PT with [facility], left VM requesting verbal orders for home health PT for 1w8. Called Tiffany and gave verbal orders.

## 2023-03-11 DIAGNOSIS — J449 Chronic obstructive pulmonary disease, unspecified: Secondary | ICD-10-CM | POA: Diagnosis not present

## 2023-03-11 DIAGNOSIS — I2510 Atherosclerotic heart disease of native coronary artery without angina pectoris: Secondary | ICD-10-CM | POA: Diagnosis not present

## 2023-03-11 DIAGNOSIS — G8929 Other chronic pain: Secondary | ICD-10-CM | POA: Diagnosis not present

## 2023-03-11 DIAGNOSIS — N3000 Acute cystitis without hematuria: Secondary | ICD-10-CM | POA: Diagnosis not present

## 2023-03-11 DIAGNOSIS — R911 Solitary pulmonary nodule: Secondary | ICD-10-CM | POA: Diagnosis not present

## 2023-03-11 DIAGNOSIS — M549 Dorsalgia, unspecified: Secondary | ICD-10-CM | POA: Diagnosis not present

## 2023-03-11 DIAGNOSIS — G47419 Narcolepsy without cataplexy: Secondary | ICD-10-CM | POA: Diagnosis not present

## 2023-03-11 DIAGNOSIS — Z9181 History of falling: Secondary | ICD-10-CM | POA: Diagnosis not present

## 2023-03-11 DIAGNOSIS — E876 Hypokalemia: Secondary | ICD-10-CM | POA: Diagnosis not present

## 2023-03-11 DIAGNOSIS — G9341 Metabolic encephalopathy: Secondary | ICD-10-CM | POA: Diagnosis not present

## 2023-03-11 DIAGNOSIS — N8110 Cystocele, unspecified: Secondary | ICD-10-CM | POA: Diagnosis not present

## 2023-03-11 DIAGNOSIS — E785 Hyperlipidemia, unspecified: Secondary | ICD-10-CM | POA: Diagnosis not present

## 2023-03-11 DIAGNOSIS — M47812 Spondylosis without myelopathy or radiculopathy, cervical region: Secondary | ICD-10-CM | POA: Diagnosis not present

## 2023-03-11 DIAGNOSIS — K219 Gastro-esophageal reflux disease without esophagitis: Secondary | ICD-10-CM | POA: Diagnosis not present

## 2023-03-11 DIAGNOSIS — I951 Orthostatic hypotension: Secondary | ICD-10-CM | POA: Diagnosis not present

## 2023-03-11 DIAGNOSIS — E43 Unspecified severe protein-calorie malnutrition: Secondary | ICD-10-CM | POA: Diagnosis not present

## 2023-03-11 DIAGNOSIS — M1712 Unilateral primary osteoarthritis, left knee: Secondary | ICD-10-CM | POA: Diagnosis not present

## 2023-03-12 DIAGNOSIS — E876 Hypokalemia: Secondary | ICD-10-CM | POA: Diagnosis not present

## 2023-03-12 DIAGNOSIS — E43 Unspecified severe protein-calorie malnutrition: Secondary | ICD-10-CM | POA: Diagnosis not present

## 2023-03-12 DIAGNOSIS — N8110 Cystocele, unspecified: Secondary | ICD-10-CM | POA: Diagnosis not present

## 2023-03-12 DIAGNOSIS — M1712 Unilateral primary osteoarthritis, left knee: Secondary | ICD-10-CM | POA: Diagnosis not present

## 2023-03-12 DIAGNOSIS — Z9181 History of falling: Secondary | ICD-10-CM | POA: Diagnosis not present

## 2023-03-12 DIAGNOSIS — I951 Orthostatic hypotension: Secondary | ICD-10-CM | POA: Diagnosis not present

## 2023-03-12 DIAGNOSIS — K219 Gastro-esophageal reflux disease without esophagitis: Secondary | ICD-10-CM | POA: Diagnosis not present

## 2023-03-12 DIAGNOSIS — J449 Chronic obstructive pulmonary disease, unspecified: Secondary | ICD-10-CM | POA: Diagnosis not present

## 2023-03-12 DIAGNOSIS — G47419 Narcolepsy without cataplexy: Secondary | ICD-10-CM | POA: Diagnosis not present

## 2023-03-12 DIAGNOSIS — I2510 Atherosclerotic heart disease of native coronary artery without angina pectoris: Secondary | ICD-10-CM | POA: Diagnosis not present

## 2023-03-12 DIAGNOSIS — E785 Hyperlipidemia, unspecified: Secondary | ICD-10-CM | POA: Diagnosis not present

## 2023-03-12 DIAGNOSIS — R911 Solitary pulmonary nodule: Secondary | ICD-10-CM | POA: Diagnosis not present

## 2023-03-12 DIAGNOSIS — M47812 Spondylosis without myelopathy or radiculopathy, cervical region: Secondary | ICD-10-CM | POA: Diagnosis not present

## 2023-03-12 DIAGNOSIS — N3000 Acute cystitis without hematuria: Secondary | ICD-10-CM | POA: Diagnosis not present

## 2023-03-12 DIAGNOSIS — G9341 Metabolic encephalopathy: Secondary | ICD-10-CM | POA: Diagnosis not present

## 2023-03-12 DIAGNOSIS — M549 Dorsalgia, unspecified: Secondary | ICD-10-CM | POA: Diagnosis not present

## 2023-03-12 DIAGNOSIS — G8929 Other chronic pain: Secondary | ICD-10-CM | POA: Diagnosis not present

## 2023-03-13 ENCOUNTER — Telehealth: Payer: Self-pay | Admitting: Family

## 2023-03-13 DIAGNOSIS — G47419 Narcolepsy without cataplexy: Secondary | ICD-10-CM | POA: Diagnosis not present

## 2023-03-13 DIAGNOSIS — E43 Unspecified severe protein-calorie malnutrition: Secondary | ICD-10-CM | POA: Diagnosis not present

## 2023-03-13 DIAGNOSIS — M47812 Spondylosis without myelopathy or radiculopathy, cervical region: Secondary | ICD-10-CM | POA: Diagnosis not present

## 2023-03-13 DIAGNOSIS — Z9181 History of falling: Secondary | ICD-10-CM | POA: Diagnosis not present

## 2023-03-13 DIAGNOSIS — G8929 Other chronic pain: Secondary | ICD-10-CM | POA: Diagnosis not present

## 2023-03-13 DIAGNOSIS — K219 Gastro-esophageal reflux disease without esophagitis: Secondary | ICD-10-CM | POA: Diagnosis not present

## 2023-03-13 DIAGNOSIS — R911 Solitary pulmonary nodule: Secondary | ICD-10-CM | POA: Diagnosis not present

## 2023-03-13 DIAGNOSIS — N3000 Acute cystitis without hematuria: Secondary | ICD-10-CM | POA: Diagnosis not present

## 2023-03-13 DIAGNOSIS — E876 Hypokalemia: Secondary | ICD-10-CM | POA: Diagnosis not present

## 2023-03-13 DIAGNOSIS — N8110 Cystocele, unspecified: Secondary | ICD-10-CM | POA: Diagnosis not present

## 2023-03-13 DIAGNOSIS — I951 Orthostatic hypotension: Secondary | ICD-10-CM | POA: Diagnosis not present

## 2023-03-13 DIAGNOSIS — M1712 Unilateral primary osteoarthritis, left knee: Secondary | ICD-10-CM | POA: Diagnosis not present

## 2023-03-13 DIAGNOSIS — I2510 Atherosclerotic heart disease of native coronary artery without angina pectoris: Secondary | ICD-10-CM | POA: Diagnosis not present

## 2023-03-13 DIAGNOSIS — G9341 Metabolic encephalopathy: Secondary | ICD-10-CM | POA: Diagnosis not present

## 2023-03-13 DIAGNOSIS — M549 Dorsalgia, unspecified: Secondary | ICD-10-CM | POA: Diagnosis not present

## 2023-03-13 DIAGNOSIS — E785 Hyperlipidemia, unspecified: Secondary | ICD-10-CM | POA: Diagnosis not present

## 2023-03-13 DIAGNOSIS — J449 Chronic obstructive pulmonary disease, unspecified: Secondary | ICD-10-CM | POA: Diagnosis not present

## 2023-03-13 NOTE — Telephone Encounter (Signed)
Junious Dresser, OT with [facility], left VM requesting verbal orders for OT for 1w8.   Called [place] back and gave verbal orders.

## 2023-03-18 DIAGNOSIS — G47419 Narcolepsy without cataplexy: Secondary | ICD-10-CM | POA: Diagnosis not present

## 2023-03-18 DIAGNOSIS — E43 Unspecified severe protein-calorie malnutrition: Secondary | ICD-10-CM | POA: Diagnosis not present

## 2023-03-18 DIAGNOSIS — I2510 Atherosclerotic heart disease of native coronary artery without angina pectoris: Secondary | ICD-10-CM | POA: Diagnosis not present

## 2023-03-18 DIAGNOSIS — E876 Hypokalemia: Secondary | ICD-10-CM | POA: Diagnosis not present

## 2023-03-18 DIAGNOSIS — G9341 Metabolic encephalopathy: Secondary | ICD-10-CM | POA: Diagnosis not present

## 2023-03-18 DIAGNOSIS — K219 Gastro-esophageal reflux disease without esophagitis: Secondary | ICD-10-CM | POA: Diagnosis not present

## 2023-03-18 DIAGNOSIS — E785 Hyperlipidemia, unspecified: Secondary | ICD-10-CM | POA: Diagnosis not present

## 2023-03-18 DIAGNOSIS — R911 Solitary pulmonary nodule: Secondary | ICD-10-CM | POA: Diagnosis not present

## 2023-03-18 DIAGNOSIS — Z9181 History of falling: Secondary | ICD-10-CM | POA: Diagnosis not present

## 2023-03-18 DIAGNOSIS — J449 Chronic obstructive pulmonary disease, unspecified: Secondary | ICD-10-CM | POA: Diagnosis not present

## 2023-03-18 DIAGNOSIS — M549 Dorsalgia, unspecified: Secondary | ICD-10-CM | POA: Diagnosis not present

## 2023-03-18 DIAGNOSIS — M1712 Unilateral primary osteoarthritis, left knee: Secondary | ICD-10-CM | POA: Diagnosis not present

## 2023-03-18 DIAGNOSIS — M47812 Spondylosis without myelopathy or radiculopathy, cervical region: Secondary | ICD-10-CM | POA: Diagnosis not present

## 2023-03-18 DIAGNOSIS — N8110 Cystocele, unspecified: Secondary | ICD-10-CM | POA: Diagnosis not present

## 2023-03-18 DIAGNOSIS — G8929 Other chronic pain: Secondary | ICD-10-CM | POA: Diagnosis not present

## 2023-03-18 DIAGNOSIS — N3000 Acute cystitis without hematuria: Secondary | ICD-10-CM | POA: Diagnosis not present

## 2023-03-18 DIAGNOSIS — I951 Orthostatic hypotension: Secondary | ICD-10-CM | POA: Diagnosis not present

## 2023-03-19 DIAGNOSIS — G47419 Narcolepsy without cataplexy: Secondary | ICD-10-CM | POA: Diagnosis not present

## 2023-03-19 DIAGNOSIS — E785 Hyperlipidemia, unspecified: Secondary | ICD-10-CM | POA: Diagnosis not present

## 2023-03-19 DIAGNOSIS — Z9181 History of falling: Secondary | ICD-10-CM | POA: Diagnosis not present

## 2023-03-19 DIAGNOSIS — M549 Dorsalgia, unspecified: Secondary | ICD-10-CM | POA: Diagnosis not present

## 2023-03-19 DIAGNOSIS — I951 Orthostatic hypotension: Secondary | ICD-10-CM | POA: Diagnosis not present

## 2023-03-19 DIAGNOSIS — K219 Gastro-esophageal reflux disease without esophagitis: Secondary | ICD-10-CM | POA: Diagnosis not present

## 2023-03-19 DIAGNOSIS — N3000 Acute cystitis without hematuria: Secondary | ICD-10-CM | POA: Diagnosis not present

## 2023-03-19 DIAGNOSIS — G9341 Metabolic encephalopathy: Secondary | ICD-10-CM | POA: Diagnosis not present

## 2023-03-19 DIAGNOSIS — R911 Solitary pulmonary nodule: Secondary | ICD-10-CM | POA: Diagnosis not present

## 2023-03-19 DIAGNOSIS — J449 Chronic obstructive pulmonary disease, unspecified: Secondary | ICD-10-CM | POA: Diagnosis not present

## 2023-03-19 DIAGNOSIS — E876 Hypokalemia: Secondary | ICD-10-CM | POA: Diagnosis not present

## 2023-03-19 DIAGNOSIS — N8110 Cystocele, unspecified: Secondary | ICD-10-CM | POA: Diagnosis not present

## 2023-03-19 DIAGNOSIS — M47812 Spondylosis without myelopathy or radiculopathy, cervical region: Secondary | ICD-10-CM | POA: Diagnosis not present

## 2023-03-19 DIAGNOSIS — I2510 Atherosclerotic heart disease of native coronary artery without angina pectoris: Secondary | ICD-10-CM | POA: Diagnosis not present

## 2023-03-19 DIAGNOSIS — E43 Unspecified severe protein-calorie malnutrition: Secondary | ICD-10-CM | POA: Diagnosis not present

## 2023-03-19 DIAGNOSIS — M1712 Unilateral primary osteoarthritis, left knee: Secondary | ICD-10-CM | POA: Diagnosis not present

## 2023-03-19 DIAGNOSIS — G8929 Other chronic pain: Secondary | ICD-10-CM | POA: Diagnosis not present

## 2023-03-20 DIAGNOSIS — E876 Hypokalemia: Secondary | ICD-10-CM | POA: Diagnosis not present

## 2023-03-20 DIAGNOSIS — G8929 Other chronic pain: Secondary | ICD-10-CM | POA: Diagnosis not present

## 2023-03-20 DIAGNOSIS — E785 Hyperlipidemia, unspecified: Secondary | ICD-10-CM | POA: Diagnosis not present

## 2023-03-20 DIAGNOSIS — E43 Unspecified severe protein-calorie malnutrition: Secondary | ICD-10-CM | POA: Diagnosis not present

## 2023-03-20 DIAGNOSIS — G9341 Metabolic encephalopathy: Secondary | ICD-10-CM | POA: Diagnosis not present

## 2023-03-20 DIAGNOSIS — M47812 Spondylosis without myelopathy or radiculopathy, cervical region: Secondary | ICD-10-CM | POA: Diagnosis not present

## 2023-03-20 DIAGNOSIS — N3000 Acute cystitis without hematuria: Secondary | ICD-10-CM | POA: Diagnosis not present

## 2023-03-20 DIAGNOSIS — I951 Orthostatic hypotension: Secondary | ICD-10-CM | POA: Diagnosis not present

## 2023-03-20 DIAGNOSIS — G47419 Narcolepsy without cataplexy: Secondary | ICD-10-CM | POA: Diagnosis not present

## 2023-03-20 DIAGNOSIS — N8110 Cystocele, unspecified: Secondary | ICD-10-CM | POA: Diagnosis not present

## 2023-03-20 DIAGNOSIS — M549 Dorsalgia, unspecified: Secondary | ICD-10-CM | POA: Diagnosis not present

## 2023-03-20 DIAGNOSIS — R911 Solitary pulmonary nodule: Secondary | ICD-10-CM | POA: Diagnosis not present

## 2023-03-20 DIAGNOSIS — Z9181 History of falling: Secondary | ICD-10-CM | POA: Diagnosis not present

## 2023-03-20 DIAGNOSIS — I2510 Atherosclerotic heart disease of native coronary artery without angina pectoris: Secondary | ICD-10-CM | POA: Diagnosis not present

## 2023-03-20 DIAGNOSIS — K219 Gastro-esophageal reflux disease without esophagitis: Secondary | ICD-10-CM | POA: Diagnosis not present

## 2023-03-20 DIAGNOSIS — J449 Chronic obstructive pulmonary disease, unspecified: Secondary | ICD-10-CM | POA: Diagnosis not present

## 2023-03-20 DIAGNOSIS — M1712 Unilateral primary osteoarthritis, left knee: Secondary | ICD-10-CM | POA: Diagnosis not present

## 2023-03-25 DIAGNOSIS — M47812 Spondylosis without myelopathy or radiculopathy, cervical region: Secondary | ICD-10-CM | POA: Diagnosis not present

## 2023-03-25 DIAGNOSIS — G8929 Other chronic pain: Secondary | ICD-10-CM | POA: Diagnosis not present

## 2023-03-25 DIAGNOSIS — K219 Gastro-esophageal reflux disease without esophagitis: Secondary | ICD-10-CM | POA: Diagnosis not present

## 2023-03-25 DIAGNOSIS — G9341 Metabolic encephalopathy: Secondary | ICD-10-CM | POA: Diagnosis not present

## 2023-03-25 DIAGNOSIS — R911 Solitary pulmonary nodule: Secondary | ICD-10-CM | POA: Diagnosis not present

## 2023-03-25 DIAGNOSIS — M549 Dorsalgia, unspecified: Secondary | ICD-10-CM | POA: Diagnosis not present

## 2023-03-25 DIAGNOSIS — E43 Unspecified severe protein-calorie malnutrition: Secondary | ICD-10-CM | POA: Diagnosis not present

## 2023-03-25 DIAGNOSIS — N8110 Cystocele, unspecified: Secondary | ICD-10-CM | POA: Diagnosis not present

## 2023-03-25 DIAGNOSIS — I2510 Atherosclerotic heart disease of native coronary artery without angina pectoris: Secondary | ICD-10-CM | POA: Diagnosis not present

## 2023-03-25 DIAGNOSIS — G47419 Narcolepsy without cataplexy: Secondary | ICD-10-CM | POA: Diagnosis not present

## 2023-03-25 DIAGNOSIS — N3000 Acute cystitis without hematuria: Secondary | ICD-10-CM | POA: Diagnosis not present

## 2023-03-25 DIAGNOSIS — E876 Hypokalemia: Secondary | ICD-10-CM | POA: Diagnosis not present

## 2023-03-25 DIAGNOSIS — M1712 Unilateral primary osteoarthritis, left knee: Secondary | ICD-10-CM | POA: Diagnosis not present

## 2023-03-25 DIAGNOSIS — J449 Chronic obstructive pulmonary disease, unspecified: Secondary | ICD-10-CM | POA: Diagnosis not present

## 2023-03-25 DIAGNOSIS — E785 Hyperlipidemia, unspecified: Secondary | ICD-10-CM | POA: Diagnosis not present

## 2023-03-25 DIAGNOSIS — Z9181 History of falling: Secondary | ICD-10-CM | POA: Diagnosis not present

## 2023-03-25 DIAGNOSIS — I951 Orthostatic hypotension: Secondary | ICD-10-CM | POA: Diagnosis not present

## 2023-04-01 DIAGNOSIS — J449 Chronic obstructive pulmonary disease, unspecified: Secondary | ICD-10-CM | POA: Diagnosis not present

## 2023-04-01 DIAGNOSIS — N3000 Acute cystitis without hematuria: Secondary | ICD-10-CM | POA: Diagnosis not present

## 2023-04-01 DIAGNOSIS — G9341 Metabolic encephalopathy: Secondary | ICD-10-CM | POA: Diagnosis not present

## 2023-04-01 DIAGNOSIS — M47812 Spondylosis without myelopathy or radiculopathy, cervical region: Secondary | ICD-10-CM | POA: Diagnosis not present

## 2023-04-01 DIAGNOSIS — G47419 Narcolepsy without cataplexy: Secondary | ICD-10-CM | POA: Diagnosis not present

## 2023-04-01 DIAGNOSIS — N8110 Cystocele, unspecified: Secondary | ICD-10-CM | POA: Diagnosis not present

## 2023-04-01 DIAGNOSIS — G8929 Other chronic pain: Secondary | ICD-10-CM | POA: Diagnosis not present

## 2023-04-01 DIAGNOSIS — I951 Orthostatic hypotension: Secondary | ICD-10-CM | POA: Diagnosis not present

## 2023-04-01 DIAGNOSIS — E876 Hypokalemia: Secondary | ICD-10-CM | POA: Diagnosis not present

## 2023-04-01 DIAGNOSIS — E43 Unspecified severe protein-calorie malnutrition: Secondary | ICD-10-CM | POA: Diagnosis not present

## 2023-04-01 DIAGNOSIS — M1712 Unilateral primary osteoarthritis, left knee: Secondary | ICD-10-CM | POA: Diagnosis not present

## 2023-04-01 DIAGNOSIS — I2510 Atherosclerotic heart disease of native coronary artery without angina pectoris: Secondary | ICD-10-CM | POA: Diagnosis not present

## 2023-04-01 DIAGNOSIS — K219 Gastro-esophageal reflux disease without esophagitis: Secondary | ICD-10-CM | POA: Diagnosis not present

## 2023-04-01 DIAGNOSIS — R911 Solitary pulmonary nodule: Secondary | ICD-10-CM | POA: Diagnosis not present

## 2023-04-01 DIAGNOSIS — Z9181 History of falling: Secondary | ICD-10-CM | POA: Diagnosis not present

## 2023-04-01 DIAGNOSIS — M549 Dorsalgia, unspecified: Secondary | ICD-10-CM | POA: Diagnosis not present

## 2023-04-01 DIAGNOSIS — E785 Hyperlipidemia, unspecified: Secondary | ICD-10-CM | POA: Diagnosis not present

## 2023-04-02 DIAGNOSIS — N8110 Cystocele, unspecified: Secondary | ICD-10-CM | POA: Diagnosis not present

## 2023-04-02 DIAGNOSIS — I2510 Atherosclerotic heart disease of native coronary artery without angina pectoris: Secondary | ICD-10-CM | POA: Diagnosis not present

## 2023-04-02 DIAGNOSIS — M549 Dorsalgia, unspecified: Secondary | ICD-10-CM | POA: Diagnosis not present

## 2023-04-02 DIAGNOSIS — M1712 Unilateral primary osteoarthritis, left knee: Secondary | ICD-10-CM | POA: Diagnosis not present

## 2023-04-02 DIAGNOSIS — J449 Chronic obstructive pulmonary disease, unspecified: Secondary | ICD-10-CM | POA: Diagnosis not present

## 2023-04-02 DIAGNOSIS — E785 Hyperlipidemia, unspecified: Secondary | ICD-10-CM | POA: Diagnosis not present

## 2023-04-02 DIAGNOSIS — E43 Unspecified severe protein-calorie malnutrition: Secondary | ICD-10-CM | POA: Diagnosis not present

## 2023-04-02 DIAGNOSIS — M47812 Spondylosis without myelopathy or radiculopathy, cervical region: Secondary | ICD-10-CM | POA: Diagnosis not present

## 2023-04-02 DIAGNOSIS — G47419 Narcolepsy without cataplexy: Secondary | ICD-10-CM | POA: Diagnosis not present

## 2023-04-02 DIAGNOSIS — G8929 Other chronic pain: Secondary | ICD-10-CM | POA: Diagnosis not present

## 2023-04-02 DIAGNOSIS — E876 Hypokalemia: Secondary | ICD-10-CM | POA: Diagnosis not present

## 2023-04-02 DIAGNOSIS — R911 Solitary pulmonary nodule: Secondary | ICD-10-CM | POA: Diagnosis not present

## 2023-04-02 DIAGNOSIS — K219 Gastro-esophageal reflux disease without esophagitis: Secondary | ICD-10-CM | POA: Diagnosis not present

## 2023-04-02 DIAGNOSIS — I951 Orthostatic hypotension: Secondary | ICD-10-CM | POA: Diagnosis not present

## 2023-04-02 DIAGNOSIS — N3000 Acute cystitis without hematuria: Secondary | ICD-10-CM | POA: Diagnosis not present

## 2023-04-02 DIAGNOSIS — G9341 Metabolic encephalopathy: Secondary | ICD-10-CM | POA: Diagnosis not present

## 2023-04-02 DIAGNOSIS — Z9181 History of falling: Secondary | ICD-10-CM | POA: Diagnosis not present

## 2023-04-09 DIAGNOSIS — M549 Dorsalgia, unspecified: Secondary | ICD-10-CM | POA: Diagnosis not present

## 2023-04-09 DIAGNOSIS — E785 Hyperlipidemia, unspecified: Secondary | ICD-10-CM | POA: Diagnosis not present

## 2023-04-09 DIAGNOSIS — I951 Orthostatic hypotension: Secondary | ICD-10-CM | POA: Diagnosis not present

## 2023-04-09 DIAGNOSIS — G47419 Narcolepsy without cataplexy: Secondary | ICD-10-CM | POA: Diagnosis not present

## 2023-04-09 DIAGNOSIS — M1712 Unilateral primary osteoarthritis, left knee: Secondary | ICD-10-CM | POA: Diagnosis not present

## 2023-04-09 DIAGNOSIS — G9341 Metabolic encephalopathy: Secondary | ICD-10-CM | POA: Diagnosis not present

## 2023-04-09 DIAGNOSIS — K219 Gastro-esophageal reflux disease without esophagitis: Secondary | ICD-10-CM | POA: Diagnosis not present

## 2023-04-09 DIAGNOSIS — E876 Hypokalemia: Secondary | ICD-10-CM | POA: Diagnosis not present

## 2023-04-09 DIAGNOSIS — J449 Chronic obstructive pulmonary disease, unspecified: Secondary | ICD-10-CM | POA: Diagnosis not present

## 2023-04-09 DIAGNOSIS — N8110 Cystocele, unspecified: Secondary | ICD-10-CM | POA: Diagnosis not present

## 2023-04-09 DIAGNOSIS — N3000 Acute cystitis without hematuria: Secondary | ICD-10-CM | POA: Diagnosis not present

## 2023-04-09 DIAGNOSIS — I2510 Atherosclerotic heart disease of native coronary artery without angina pectoris: Secondary | ICD-10-CM | POA: Diagnosis not present

## 2023-04-09 DIAGNOSIS — E43 Unspecified severe protein-calorie malnutrition: Secondary | ICD-10-CM | POA: Diagnosis not present

## 2023-04-09 DIAGNOSIS — G8929 Other chronic pain: Secondary | ICD-10-CM | POA: Diagnosis not present

## 2023-04-09 DIAGNOSIS — M47812 Spondylosis without myelopathy or radiculopathy, cervical region: Secondary | ICD-10-CM | POA: Diagnosis not present

## 2023-04-09 DIAGNOSIS — Z9181 History of falling: Secondary | ICD-10-CM | POA: Diagnosis not present

## 2023-04-09 DIAGNOSIS — R911 Solitary pulmonary nodule: Secondary | ICD-10-CM | POA: Diagnosis not present

## 2023-04-10 ENCOUNTER — Telehealth: Payer: Self-pay | Admitting: Family

## 2023-04-10 NOTE — Telephone Encounter (Signed)
Mal Misty, PT with [facility], left VM to report patient having an elevated HR of 108-114 yesterday. Also reports patient having high anxiety levels and some depression.  Callback # [phone]

## 2023-04-15 DIAGNOSIS — M47812 Spondylosis without myelopathy or radiculopathy, cervical region: Secondary | ICD-10-CM | POA: Diagnosis not present

## 2023-04-15 DIAGNOSIS — K219 Gastro-esophageal reflux disease without esophagitis: Secondary | ICD-10-CM | POA: Diagnosis not present

## 2023-04-15 DIAGNOSIS — Z9181 History of falling: Secondary | ICD-10-CM | POA: Diagnosis not present

## 2023-04-15 DIAGNOSIS — E785 Hyperlipidemia, unspecified: Secondary | ICD-10-CM | POA: Diagnosis not present

## 2023-04-15 DIAGNOSIS — E876 Hypokalemia: Secondary | ICD-10-CM | POA: Diagnosis not present

## 2023-04-15 DIAGNOSIS — I951 Orthostatic hypotension: Secondary | ICD-10-CM | POA: Diagnosis not present

## 2023-04-15 DIAGNOSIS — M549 Dorsalgia, unspecified: Secondary | ICD-10-CM | POA: Diagnosis not present

## 2023-04-15 DIAGNOSIS — G9341 Metabolic encephalopathy: Secondary | ICD-10-CM | POA: Diagnosis not present

## 2023-04-15 DIAGNOSIS — G47419 Narcolepsy without cataplexy: Secondary | ICD-10-CM | POA: Diagnosis not present

## 2023-04-15 DIAGNOSIS — R911 Solitary pulmonary nodule: Secondary | ICD-10-CM | POA: Diagnosis not present

## 2023-04-15 DIAGNOSIS — G8929 Other chronic pain: Secondary | ICD-10-CM | POA: Diagnosis not present

## 2023-04-15 DIAGNOSIS — N3000 Acute cystitis without hematuria: Secondary | ICD-10-CM | POA: Diagnosis not present

## 2023-04-15 DIAGNOSIS — J449 Chronic obstructive pulmonary disease, unspecified: Secondary | ICD-10-CM | POA: Diagnosis not present

## 2023-04-15 DIAGNOSIS — E43 Unspecified severe protein-calorie malnutrition: Secondary | ICD-10-CM | POA: Diagnosis not present

## 2023-04-15 DIAGNOSIS — M1712 Unilateral primary osteoarthritis, left knee: Secondary | ICD-10-CM | POA: Diagnosis not present

## 2023-04-15 DIAGNOSIS — N8110 Cystocele, unspecified: Secondary | ICD-10-CM | POA: Diagnosis not present

## 2023-04-15 DIAGNOSIS — I2510 Atherosclerotic heart disease of native coronary artery without angina pectoris: Secondary | ICD-10-CM | POA: Diagnosis not present

## 2023-04-16 DIAGNOSIS — N8110 Cystocele, unspecified: Secondary | ICD-10-CM | POA: Diagnosis not present

## 2023-04-16 DIAGNOSIS — R911 Solitary pulmonary nodule: Secondary | ICD-10-CM | POA: Diagnosis not present

## 2023-04-16 DIAGNOSIS — K219 Gastro-esophageal reflux disease without esophagitis: Secondary | ICD-10-CM | POA: Diagnosis not present

## 2023-04-16 DIAGNOSIS — I2510 Atherosclerotic heart disease of native coronary artery without angina pectoris: Secondary | ICD-10-CM | POA: Diagnosis not present

## 2023-04-16 DIAGNOSIS — J449 Chronic obstructive pulmonary disease, unspecified: Secondary | ICD-10-CM | POA: Diagnosis not present

## 2023-04-16 DIAGNOSIS — G47419 Narcolepsy without cataplexy: Secondary | ICD-10-CM | POA: Diagnosis not present

## 2023-04-16 DIAGNOSIS — N3000 Acute cystitis without hematuria: Secondary | ICD-10-CM | POA: Diagnosis not present

## 2023-04-16 DIAGNOSIS — E43 Unspecified severe protein-calorie malnutrition: Secondary | ICD-10-CM | POA: Diagnosis not present

## 2023-04-16 DIAGNOSIS — E876 Hypokalemia: Secondary | ICD-10-CM | POA: Diagnosis not present

## 2023-04-16 DIAGNOSIS — I951 Orthostatic hypotension: Secondary | ICD-10-CM | POA: Diagnosis not present

## 2023-04-16 DIAGNOSIS — E785 Hyperlipidemia, unspecified: Secondary | ICD-10-CM | POA: Diagnosis not present

## 2023-04-16 DIAGNOSIS — G9341 Metabolic encephalopathy: Secondary | ICD-10-CM | POA: Diagnosis not present

## 2023-04-16 DIAGNOSIS — M549 Dorsalgia, unspecified: Secondary | ICD-10-CM | POA: Diagnosis not present

## 2023-04-16 DIAGNOSIS — Z9181 History of falling: Secondary | ICD-10-CM | POA: Diagnosis not present

## 2023-04-16 DIAGNOSIS — M47812 Spondylosis without myelopathy or radiculopathy, cervical region: Secondary | ICD-10-CM | POA: Diagnosis not present

## 2023-04-16 DIAGNOSIS — M1712 Unilateral primary osteoarthritis, left knee: Secondary | ICD-10-CM | POA: Diagnosis not present

## 2023-04-16 DIAGNOSIS — G8929 Other chronic pain: Secondary | ICD-10-CM | POA: Diagnosis not present

## 2023-04-18 DIAGNOSIS — M47812 Spondylosis without myelopathy or radiculopathy, cervical region: Secondary | ICD-10-CM | POA: Diagnosis not present

## 2023-04-18 DIAGNOSIS — R911 Solitary pulmonary nodule: Secondary | ICD-10-CM | POA: Diagnosis not present

## 2023-04-18 DIAGNOSIS — K219 Gastro-esophageal reflux disease without esophagitis: Secondary | ICD-10-CM | POA: Diagnosis not present

## 2023-04-18 DIAGNOSIS — J449 Chronic obstructive pulmonary disease, unspecified: Secondary | ICD-10-CM | POA: Diagnosis not present

## 2023-04-18 DIAGNOSIS — H353211 Exudative age-related macular degeneration, right eye, with active choroidal neovascularization: Secondary | ICD-10-CM | POA: Diagnosis not present

## 2023-04-18 DIAGNOSIS — N8110 Cystocele, unspecified: Secondary | ICD-10-CM | POA: Diagnosis not present

## 2023-04-18 DIAGNOSIS — I2510 Atherosclerotic heart disease of native coronary artery without angina pectoris: Secondary | ICD-10-CM | POA: Diagnosis not present

## 2023-04-18 DIAGNOSIS — I951 Orthostatic hypotension: Secondary | ICD-10-CM | POA: Diagnosis not present

## 2023-04-18 DIAGNOSIS — E43 Unspecified severe protein-calorie malnutrition: Secondary | ICD-10-CM | POA: Diagnosis not present

## 2023-04-18 DIAGNOSIS — N3000 Acute cystitis without hematuria: Secondary | ICD-10-CM | POA: Diagnosis not present

## 2023-04-18 DIAGNOSIS — Z9181 History of falling: Secondary | ICD-10-CM | POA: Diagnosis not present

## 2023-04-18 DIAGNOSIS — G9341 Metabolic encephalopathy: Secondary | ICD-10-CM | POA: Diagnosis not present

## 2023-04-18 DIAGNOSIS — G8929 Other chronic pain: Secondary | ICD-10-CM | POA: Diagnosis not present

## 2023-04-18 DIAGNOSIS — G47419 Narcolepsy without cataplexy: Secondary | ICD-10-CM | POA: Diagnosis not present

## 2023-04-18 DIAGNOSIS — M1712 Unilateral primary osteoarthritis, left knee: Secondary | ICD-10-CM | POA: Diagnosis not present

## 2023-04-18 DIAGNOSIS — E876 Hypokalemia: Secondary | ICD-10-CM | POA: Diagnosis not present

## 2023-04-18 DIAGNOSIS — E785 Hyperlipidemia, unspecified: Secondary | ICD-10-CM | POA: Diagnosis not present

## 2023-04-18 DIAGNOSIS — M549 Dorsalgia, unspecified: Secondary | ICD-10-CM | POA: Diagnosis not present

## 2023-04-22 DIAGNOSIS — G47419 Narcolepsy without cataplexy: Secondary | ICD-10-CM | POA: Diagnosis not present

## 2023-04-22 DIAGNOSIS — N3000 Acute cystitis without hematuria: Secondary | ICD-10-CM | POA: Diagnosis not present

## 2023-04-22 DIAGNOSIS — E785 Hyperlipidemia, unspecified: Secondary | ICD-10-CM | POA: Diagnosis not present

## 2023-04-22 DIAGNOSIS — I2510 Atherosclerotic heart disease of native coronary artery without angina pectoris: Secondary | ICD-10-CM | POA: Diagnosis not present

## 2023-04-22 DIAGNOSIS — G8929 Other chronic pain: Secondary | ICD-10-CM | POA: Diagnosis not present

## 2023-04-22 DIAGNOSIS — N8110 Cystocele, unspecified: Secondary | ICD-10-CM | POA: Diagnosis not present

## 2023-04-22 DIAGNOSIS — I951 Orthostatic hypotension: Secondary | ICD-10-CM | POA: Diagnosis not present

## 2023-04-22 DIAGNOSIS — R911 Solitary pulmonary nodule: Secondary | ICD-10-CM | POA: Diagnosis not present

## 2023-04-22 DIAGNOSIS — K219 Gastro-esophageal reflux disease without esophagitis: Secondary | ICD-10-CM | POA: Diagnosis not present

## 2023-04-22 DIAGNOSIS — M1712 Unilateral primary osteoarthritis, left knee: Secondary | ICD-10-CM | POA: Diagnosis not present

## 2023-04-22 DIAGNOSIS — E876 Hypokalemia: Secondary | ICD-10-CM | POA: Diagnosis not present

## 2023-04-22 DIAGNOSIS — Z9181 History of falling: Secondary | ICD-10-CM | POA: Diagnosis not present

## 2023-04-22 DIAGNOSIS — M47812 Spondylosis without myelopathy or radiculopathy, cervical region: Secondary | ICD-10-CM | POA: Diagnosis not present

## 2023-04-22 DIAGNOSIS — M549 Dorsalgia, unspecified: Secondary | ICD-10-CM | POA: Diagnosis not present

## 2023-04-22 DIAGNOSIS — E43 Unspecified severe protein-calorie malnutrition: Secondary | ICD-10-CM | POA: Diagnosis not present

## 2023-04-22 DIAGNOSIS — J449 Chronic obstructive pulmonary disease, unspecified: Secondary | ICD-10-CM | POA: Diagnosis not present

## 2023-04-22 DIAGNOSIS — G9341 Metabolic encephalopathy: Secondary | ICD-10-CM | POA: Diagnosis not present

## 2023-04-28 DIAGNOSIS — G47419 Narcolepsy without cataplexy: Secondary | ICD-10-CM | POA: Diagnosis not present

## 2023-04-29 DIAGNOSIS — G8929 Other chronic pain: Secondary | ICD-10-CM | POA: Diagnosis not present

## 2023-04-29 DIAGNOSIS — J449 Chronic obstructive pulmonary disease, unspecified: Secondary | ICD-10-CM | POA: Diagnosis not present

## 2023-04-29 DIAGNOSIS — G9341 Metabolic encephalopathy: Secondary | ICD-10-CM | POA: Diagnosis not present

## 2023-04-29 DIAGNOSIS — M1712 Unilateral primary osteoarthritis, left knee: Secondary | ICD-10-CM | POA: Diagnosis not present

## 2023-04-29 DIAGNOSIS — M549 Dorsalgia, unspecified: Secondary | ICD-10-CM | POA: Diagnosis not present

## 2023-04-29 DIAGNOSIS — M47812 Spondylosis without myelopathy or radiculopathy, cervical region: Secondary | ICD-10-CM | POA: Diagnosis not present

## 2023-04-29 DIAGNOSIS — E43 Unspecified severe protein-calorie malnutrition: Secondary | ICD-10-CM | POA: Diagnosis not present

## 2023-04-29 DIAGNOSIS — N3000 Acute cystitis without hematuria: Secondary | ICD-10-CM | POA: Diagnosis not present

## 2023-04-29 DIAGNOSIS — K219 Gastro-esophageal reflux disease without esophagitis: Secondary | ICD-10-CM | POA: Diagnosis not present

## 2023-04-29 DIAGNOSIS — Z9181 History of falling: Secondary | ICD-10-CM | POA: Diagnosis not present

## 2023-04-29 DIAGNOSIS — G47419 Narcolepsy without cataplexy: Secondary | ICD-10-CM | POA: Diagnosis not present

## 2023-04-29 DIAGNOSIS — I2510 Atherosclerotic heart disease of native coronary artery without angina pectoris: Secondary | ICD-10-CM | POA: Diagnosis not present

## 2023-04-29 DIAGNOSIS — E876 Hypokalemia: Secondary | ICD-10-CM | POA: Diagnosis not present

## 2023-04-29 DIAGNOSIS — R911 Solitary pulmonary nodule: Secondary | ICD-10-CM | POA: Diagnosis not present

## 2023-04-29 DIAGNOSIS — E785 Hyperlipidemia, unspecified: Secondary | ICD-10-CM | POA: Diagnosis not present

## 2023-04-29 DIAGNOSIS — N8110 Cystocele, unspecified: Secondary | ICD-10-CM | POA: Diagnosis not present

## 2023-04-29 DIAGNOSIS — I951 Orthostatic hypotension: Secondary | ICD-10-CM | POA: Diagnosis not present

## 2023-04-30 DIAGNOSIS — R911 Solitary pulmonary nodule: Secondary | ICD-10-CM | POA: Diagnosis not present

## 2023-04-30 DIAGNOSIS — G9341 Metabolic encephalopathy: Secondary | ICD-10-CM | POA: Diagnosis not present

## 2023-04-30 DIAGNOSIS — E876 Hypokalemia: Secondary | ICD-10-CM | POA: Diagnosis not present

## 2023-04-30 DIAGNOSIS — N8110 Cystocele, unspecified: Secondary | ICD-10-CM | POA: Diagnosis not present

## 2023-04-30 DIAGNOSIS — Z9181 History of falling: Secondary | ICD-10-CM | POA: Diagnosis not present

## 2023-04-30 DIAGNOSIS — I951 Orthostatic hypotension: Secondary | ICD-10-CM | POA: Diagnosis not present

## 2023-04-30 DIAGNOSIS — E43 Unspecified severe protein-calorie malnutrition: Secondary | ICD-10-CM | POA: Diagnosis not present

## 2023-04-30 DIAGNOSIS — G47419 Narcolepsy without cataplexy: Secondary | ICD-10-CM | POA: Diagnosis not present

## 2023-04-30 DIAGNOSIS — J449 Chronic obstructive pulmonary disease, unspecified: Secondary | ICD-10-CM | POA: Diagnosis not present

## 2023-04-30 DIAGNOSIS — M549 Dorsalgia, unspecified: Secondary | ICD-10-CM | POA: Diagnosis not present

## 2023-04-30 DIAGNOSIS — K219 Gastro-esophageal reflux disease without esophagitis: Secondary | ICD-10-CM | POA: Diagnosis not present

## 2023-04-30 DIAGNOSIS — N3000 Acute cystitis without hematuria: Secondary | ICD-10-CM | POA: Diagnosis not present

## 2023-04-30 DIAGNOSIS — E785 Hyperlipidemia, unspecified: Secondary | ICD-10-CM | POA: Diagnosis not present

## 2023-04-30 DIAGNOSIS — M1712 Unilateral primary osteoarthritis, left knee: Secondary | ICD-10-CM | POA: Diagnosis not present

## 2023-04-30 DIAGNOSIS — I2510 Atherosclerotic heart disease of native coronary artery without angina pectoris: Secondary | ICD-10-CM | POA: Diagnosis not present

## 2023-04-30 DIAGNOSIS — G8929 Other chronic pain: Secondary | ICD-10-CM | POA: Diagnosis not present

## 2023-04-30 DIAGNOSIS — M47812 Spondylosis without myelopathy or radiculopathy, cervical region: Secondary | ICD-10-CM | POA: Diagnosis not present

## 2023-05-06 DIAGNOSIS — M1712 Unilateral primary osteoarthritis, left knee: Secondary | ICD-10-CM | POA: Diagnosis not present

## 2023-05-06 DIAGNOSIS — I2510 Atherosclerotic heart disease of native coronary artery without angina pectoris: Secondary | ICD-10-CM | POA: Diagnosis not present

## 2023-05-06 DIAGNOSIS — E43 Unspecified severe protein-calorie malnutrition: Secondary | ICD-10-CM | POA: Diagnosis not present

## 2023-05-06 DIAGNOSIS — M47812 Spondylosis without myelopathy or radiculopathy, cervical region: Secondary | ICD-10-CM | POA: Diagnosis not present

## 2023-05-06 DIAGNOSIS — E785 Hyperlipidemia, unspecified: Secondary | ICD-10-CM | POA: Diagnosis not present

## 2023-05-06 DIAGNOSIS — Z9181 History of falling: Secondary | ICD-10-CM | POA: Diagnosis not present

## 2023-05-06 DIAGNOSIS — G9341 Metabolic encephalopathy: Secondary | ICD-10-CM | POA: Diagnosis not present

## 2023-05-06 DIAGNOSIS — G8929 Other chronic pain: Secondary | ICD-10-CM | POA: Diagnosis not present

## 2023-05-06 DIAGNOSIS — I951 Orthostatic hypotension: Secondary | ICD-10-CM | POA: Diagnosis not present

## 2023-05-06 DIAGNOSIS — M549 Dorsalgia, unspecified: Secondary | ICD-10-CM | POA: Diagnosis not present

## 2023-05-06 DIAGNOSIS — N8110 Cystocele, unspecified: Secondary | ICD-10-CM | POA: Diagnosis not present

## 2023-05-06 DIAGNOSIS — N3000 Acute cystitis without hematuria: Secondary | ICD-10-CM | POA: Diagnosis not present

## 2023-05-06 DIAGNOSIS — E876 Hypokalemia: Secondary | ICD-10-CM | POA: Diagnosis not present

## 2023-05-06 DIAGNOSIS — R911 Solitary pulmonary nodule: Secondary | ICD-10-CM | POA: Diagnosis not present

## 2023-05-06 DIAGNOSIS — J449 Chronic obstructive pulmonary disease, unspecified: Secondary | ICD-10-CM | POA: Diagnosis not present

## 2023-05-06 DIAGNOSIS — G47419 Narcolepsy without cataplexy: Secondary | ICD-10-CM | POA: Diagnosis not present

## 2023-05-06 DIAGNOSIS — K219 Gastro-esophageal reflux disease without esophagitis: Secondary | ICD-10-CM | POA: Diagnosis not present

## 2023-05-22 DIAGNOSIS — M47812 Spondylosis without myelopathy or radiculopathy, cervical region: Secondary | ICD-10-CM | POA: Diagnosis not present

## 2023-05-22 DIAGNOSIS — I951 Orthostatic hypotension: Secondary | ICD-10-CM | POA: Diagnosis not present

## 2023-05-22 DIAGNOSIS — R911 Solitary pulmonary nodule: Secondary | ICD-10-CM | POA: Diagnosis not present

## 2023-05-22 DIAGNOSIS — K219 Gastro-esophageal reflux disease without esophagitis: Secondary | ICD-10-CM | POA: Diagnosis not present

## 2023-05-22 DIAGNOSIS — E785 Hyperlipidemia, unspecified: Secondary | ICD-10-CM | POA: Diagnosis not present

## 2023-05-22 DIAGNOSIS — N8110 Cystocele, unspecified: Secondary | ICD-10-CM | POA: Diagnosis not present

## 2023-05-22 DIAGNOSIS — G8929 Other chronic pain: Secondary | ICD-10-CM | POA: Diagnosis not present

## 2023-05-22 DIAGNOSIS — E876 Hypokalemia: Secondary | ICD-10-CM | POA: Diagnosis not present

## 2023-05-22 DIAGNOSIS — G47419 Narcolepsy without cataplexy: Secondary | ICD-10-CM | POA: Diagnosis not present

## 2023-05-22 DIAGNOSIS — Z9181 History of falling: Secondary | ICD-10-CM | POA: Diagnosis not present

## 2023-05-22 DIAGNOSIS — E43 Unspecified severe protein-calorie malnutrition: Secondary | ICD-10-CM | POA: Diagnosis not present

## 2023-05-22 DIAGNOSIS — J449 Chronic obstructive pulmonary disease, unspecified: Secondary | ICD-10-CM | POA: Diagnosis not present

## 2023-05-22 DIAGNOSIS — I2510 Atherosclerotic heart disease of native coronary artery without angina pectoris: Secondary | ICD-10-CM | POA: Diagnosis not present

## 2023-05-22 DIAGNOSIS — G9341 Metabolic encephalopathy: Secondary | ICD-10-CM | POA: Diagnosis not present

## 2023-05-22 DIAGNOSIS — M549 Dorsalgia, unspecified: Secondary | ICD-10-CM | POA: Diagnosis not present

## 2023-05-22 DIAGNOSIS — M1712 Unilateral primary osteoarthritis, left knee: Secondary | ICD-10-CM | POA: Diagnosis not present

## 2023-05-22 DIAGNOSIS — N3000 Acute cystitis without hematuria: Secondary | ICD-10-CM | POA: Diagnosis not present

## 2023-05-26 ENCOUNTER — Telehealth: Payer: Self-pay | Admitting: Family

## 2023-05-26 DIAGNOSIS — H353211 Exudative age-related macular degeneration, right eye, with active choroidal neovascularization: Secondary | ICD-10-CM | POA: Diagnosis not present

## 2023-05-26 NOTE — Telephone Encounter (Signed)
 Harvie Heck called from [facility] stating that when he seen the patient on Thursday she was c/o dysuria, urinary frequency and urgency. They would like to know if you want them to do a UA or have the patient come by the office and do one.   I spoke with Marchelle Folks and it would be ideal if home health can get the UA so the patient doesn't have to find transportation.   Harvie Heck said they just need a PRN order faxed to [place] at [phone] and they will get someone out to the home. Please place these orders.

## 2023-05-30 ENCOUNTER — Ambulatory Visit: Admitting: Cardiology

## 2023-05-30 ENCOUNTER — Ambulatory Visit: Admitting: Family

## 2023-05-30 DIAGNOSIS — N898 Other specified noninflammatory disorders of vagina: Secondary | ICD-10-CM

## 2023-05-30 MED ORDER — FLUCONAZOLE 100 MG PO TABS
100.0000 mg | ORAL_TABLET | Freq: Every day | ORAL | 0 refills | Status: AC
Start: 2023-05-30 — End: 2023-06-29

## 2023-06-03 DIAGNOSIS — E876 Hypokalemia: Secondary | ICD-10-CM | POA: Diagnosis not present

## 2023-06-03 DIAGNOSIS — R911 Solitary pulmonary nodule: Secondary | ICD-10-CM | POA: Diagnosis not present

## 2023-06-03 DIAGNOSIS — J449 Chronic obstructive pulmonary disease, unspecified: Secondary | ICD-10-CM | POA: Diagnosis not present

## 2023-06-03 DIAGNOSIS — G8929 Other chronic pain: Secondary | ICD-10-CM | POA: Diagnosis not present

## 2023-06-03 DIAGNOSIS — E43 Unspecified severe protein-calorie malnutrition: Secondary | ICD-10-CM | POA: Diagnosis not present

## 2023-06-03 DIAGNOSIS — M47812 Spondylosis without myelopathy or radiculopathy, cervical region: Secondary | ICD-10-CM | POA: Diagnosis not present

## 2023-06-03 DIAGNOSIS — G47419 Narcolepsy without cataplexy: Secondary | ICD-10-CM | POA: Diagnosis not present

## 2023-06-03 DIAGNOSIS — N3000 Acute cystitis without hematuria: Secondary | ICD-10-CM | POA: Diagnosis not present

## 2023-06-03 DIAGNOSIS — M549 Dorsalgia, unspecified: Secondary | ICD-10-CM | POA: Diagnosis not present

## 2023-06-03 DIAGNOSIS — Z9181 History of falling: Secondary | ICD-10-CM | POA: Diagnosis not present

## 2023-06-03 DIAGNOSIS — M1712 Unilateral primary osteoarthritis, left knee: Secondary | ICD-10-CM | POA: Diagnosis not present

## 2023-06-03 DIAGNOSIS — N8110 Cystocele, unspecified: Secondary | ICD-10-CM | POA: Diagnosis not present

## 2023-06-03 DIAGNOSIS — I951 Orthostatic hypotension: Secondary | ICD-10-CM | POA: Diagnosis not present

## 2023-06-03 DIAGNOSIS — I2510 Atherosclerotic heart disease of native coronary artery without angina pectoris: Secondary | ICD-10-CM | POA: Diagnosis not present

## 2023-06-03 DIAGNOSIS — E785 Hyperlipidemia, unspecified: Secondary | ICD-10-CM | POA: Diagnosis not present

## 2023-06-03 DIAGNOSIS — G9341 Metabolic encephalopathy: Secondary | ICD-10-CM | POA: Diagnosis not present

## 2023-06-03 DIAGNOSIS — K219 Gastro-esophageal reflux disease without esophagitis: Secondary | ICD-10-CM | POA: Diagnosis not present

## 2023-06-03 NOTE — Telephone Encounter (Signed)
 Patient came by the office and did UA on 04/04

## 2023-06-04 LAB — NUSWAB VAGINITIS PLUS (VG+)
Candida albicans, NAA: POSITIVE — AB
Candida glabrata, NAA: POSITIVE — AB
Chlamydia trachomatis, NAA: NEGATIVE
Neisseria gonorrhoeae, NAA: NEGATIVE
Trich vag by NAA: POSITIVE — AB

## 2023-06-04 LAB — SPECIMEN STATUS REPORT

## 2023-06-05 ENCOUNTER — Other Ambulatory Visit: Payer: Self-pay

## 2023-06-05 MED ORDER — METRONIDAZOLE 500 MG PO TABS
500.0000 mg | ORAL_TABLET | Freq: Two times a day (BID) | ORAL | 0 refills | Status: AC
Start: 2023-06-05 — End: 2023-06-15

## 2023-06-05 MED ORDER — FLUCONAZOLE 150 MG PO TABS: 150.0000 mg | ORAL_TABLET | Freq: Every day | ORAL | 0 refills | Status: DC

## 2023-09-15 ENCOUNTER — Ambulatory Visit: Admitting: Family

## 2023-09-15 ENCOUNTER — Encounter: Payer: Self-pay | Admitting: Family

## 2023-09-15 VITALS — BP 102/66 | HR 98 | Ht 62.0 in | Wt 96.4 lb

## 2023-09-15 DIAGNOSIS — G8929 Other chronic pain: Secondary | ICD-10-CM

## 2023-09-15 DIAGNOSIS — R102 Pelvic and perineal pain: Secondary | ICD-10-CM

## 2023-09-15 NOTE — Progress Notes (Unsigned)
   Acute Office Visit  Subjective:     Patient ID: Sandra Stout, female    DOB: 16-Aug-1945, 78 y.o.   MRN: [id number]  Patient is in today for  Chief Complaint  Patient presents with  . Groin Pain    Pain in groin and rectum    Groin Pain The patient's primary symptoms include pelvic pain. This is a chronic problem. The current episode started more than 1 year ago. The problem occurs constantly.     Review of Systems  Genitourinary:  Positive for pelvic pain.        Objective:    BP 102/66   Pulse 98   Ht 5' 2 (1.575 m)   Wt 96 lb 6.4 oz (43.7 kg)   LMP  (LMP Unknown)   SpO2 95%   BMI 17.63 kg/m   Physical Exam  No results found for any visits on 09/15/23.  No results found for this or any previous visit (from the past 2160 hours).  Allergies as of 09/15/2023       Reactions   Baclofen Other (See Comments)        Medication List        Accurate as of September 15, 2023  1:31 PM. If you have any questions, ask your nurse or doctor.          ALPRAZolam  1 MG tablet Commonly known as: XANAX  Take 1 mg by mouth 2 (two) times daily as needed for anxiety.   Balanced B-50 Tabs Take 1 tablet by mouth daily.   busPIRone  15 MG tablet Commonly known as: BUSPAR  Take 15 mg by mouth 3 (three) times daily.   cholecalciferol 25 MCG (1000 UNIT) tablet Commonly known as: VITAMIN D3 Take 1,000 Units by mouth daily.   co-enzyme Q-10 50 MG capsule Take 50 mg by mouth daily.   Fish Oil 1000 MG Caps Take by mouth.   fluconazole  150 MG tablet Commonly known as: DIFLUCAN  Take 1 tablet (150 mg total) by mouth daily.   methylphenidate  20 MG tablet Commonly known as: RITALIN  Take 3-4 tablets by mouth. Per day in divided doses   mirtazapine  30 MG tablet Commonly known as: REMERON  Take 15 mg by mouth at bedtime.   polyethylene glycol 17 g packet Commonly known as: MiraLax  Take 17 g by mouth daily as needed for mild constipation.   senna-docusate 8.6-50 MG  tablet Commonly known as: Senokot-S Take 1 tablet by mouth daily.   traZODone  50 MG tablet Commonly known as: DESYREL  Take 100 mg by mouth at bedtime as needed.            Assessment & Plan:   Assessment & Plan    No follow-ups on file.  Total time spent: {AMA time spent:29001} minutes  ALAN CHRISTELLA ARRANT, FNP  09/15/2023   This document may have been prepared by [facility] Voice Recognition software and as such may include unintentional dictation errors.

## 2023-09-29 NOTE — Progress Notes (Unsigned)
 Referring Physician:  Orlean Alan HERO, FNP [address] [place],  [place] [address]  Primary Physician:  Orlean Alan HERO, FNP  History of Present Illness: 10/01/2023 [name] is here today with a chief complaint of chronic low back pain as well as vaginal pain.  She denies any radiation to either lower extremity.  She states primarily the radiation it is to her private area.  She adds that she feels constipated all the time and has intense constant sharp pain in her groin area.  She denies numbness in the area or incontinence to bowel or bladder.   Weakness: none Bowel/Bladder Dysfunction: none  Conservative measures:  Physical therapy:  has not participated in for back pain?  Multimodal medical therapy including regular antiinflammatories:  none? Injections:  no epidural steroid injections  Past Surgery: no spine surgery  Sandra Stout has no symptoms of cervical myelopathy.  The symptoms are causing a significant impact on the patient's life.   Review of Systems:  A 10 point review of systems is negative, except for the pertinent positives and negatives detailed in the HPI.  Past Medical History: Past Medical History:  Diagnosis Date   Acute respiratory failure with hypoxia (HCC) 10/06/2018   ADD (attention deficit disorder)    Anxiety    Community acquired pneumonia 10/06/2018   Hemorrhoid    Hypokalemia    Hypokalemia 03/08/2021   Narcolepsy    Panic attacks    Pneumonia    Right middle lobe pneumonia 03/07/2021   Upper respiratory tract infection 03/02/2021   Wheezing 03/02/2021    Past Surgical History: Past Surgical History:  Procedure Laterality Date   CATARACT EXTRACTION W/PHACO Right 02/06/2023   Procedure: CATARACT EXTRACTION PHACO AND INTRAOCULAR LENS PLACEMENT (IOC) RIGHT  15.38 01:21.3;  Surgeon: Enola Feliciano Hugger, MD;  Location: [facility];  Service: Ophthalmology;  Laterality: Right;   CATARACT EXTRACTION W/PHACO Left  02/20/2023   Procedure: CATARACT EXTRACTION PHACO AND INTRAOCULAR LENS PLACEMENT (IOC) LEFT VISION BLUE 24.75 02:02.3;  Surgeon: Enola Feliciano Hugger, MD;  Location: [facility];  Service: Ophthalmology;  Laterality: Left;   TUBAL LIGATION      Allergies: Allergies as of 10/01/2023 - Review Complete 09/15/2023  Allergen Reaction Noted   Baclofen Other (See Comments) 04/12/2022    Medications: Outpatient Encounter Medications as of 10/01/2023  Medication Sig   ALPRAZolam  (XANAX ) 1 MG tablet Take 1 mg by mouth 2 (two) times daily as needed for anxiety.   busPIRone  (BUSPAR ) 15 MG tablet Take 15 mg by mouth 3 (three) times daily.   cholecalciferol (VITAMIN D3) 25 MCG (1000 UNIT) tablet Take 1,000 Units by mouth daily.   co-enzyme Q-10 50 MG capsule Take 50 mg by mouth daily.   fluconazole  (DIFLUCAN ) 150 MG tablet Take 1 tablet (150 mg total) by mouth daily. (Patient not taking: Reported on 09/15/2023)   methylphenidate  (RITALIN ) 20 MG tablet Take 3-4 tablets by mouth. Per day in divided doses   mirtazapine  (REMERON ) 30 MG tablet Take 15 mg by mouth at bedtime.   Omega-3 Fatty Acids (FISH OIL) 1000 MG CAPS Take by mouth. (Patient not taking: Reported on 09/15/2023)   polyethylene glycol (MIRALAX ) 17 g packet Take 17 g by mouth daily as needed for mild constipation.   senna-docusate (SENOKOT-S) 8.6-50 MG tablet Take 1 tablet by mouth daily.   traZODone  (DESYREL ) 50 MG tablet Take 100 mg by mouth at bedtime as needed.   Vitamins-Lipotropics (BALANCED B-50) TABS Take 1 tablet  by mouth daily.   No facility-administered encounter medications on file as of 10/01/2023.    Social History: Social History   Tobacco Use   Smoking status: Every Day    Current packs/day: 0.50    Types: Cigarettes    Passive exposure: Past   Smokeless tobacco: Never  Vaping Use   Vaping status: Never Used  Substance Use Topics   Alcohol use: No   Drug use: No    Family Medical History: Family History   Problem Relation Age of Onset   Lung disease Mother    Thyroid  disease Sister    Lung disease Sister    Cancer Sister        Bladder   Heart disease Father    Breast cancer Neg Hx     Physical Examination: @VITALWITHPAIN @  General: Patient is well developed, well nourished, calm, collected, and in no apparent distress. Attention to examination is appropriate.  Psychiatric: Patient is non-anxious.  Head:  Pupils equal, round, and reactive to light.  ENT:  Oral mucosa appears well hydrated.  Neck:   Supple.  Full range of motion.  Respiratory: Patient is breathing without any difficulty.  Extremities: No edema.  Vascular: Palpable dorsal pedal pulses.  Skin:   On exposed skin, there are no abnormal skin lesions.  NEUROLOGICAL:     Patient is awake and alert however is having difficulty with her orientation.  At times having trouble to answer questions or following during the appointment.  Is not oriented to year or president.   Cranial Nerves: Pupils equal round and reactive to light.  Facial tone is symmetric.   Some tenderness palpation to her lumbar spine and coccyx.  Strength:  Side Iliopsoas Quads Hamstring PF DF EHL  R 5 5 5 5 5 5   L 5 5 5 5 5 5    Patient having some difficulty with tandem gait.  Medical Decision Making  Imaging: CT CERVICAL SPINE FINDINGS   Alignment: 3 mm degenerative anterolisthesis of C3 on C4 and C4 on C5. 3 mm degenerative retrolisthesis of C5 on C6. No traumatic malalignment.   Skull base and vertebrae: No acute fracture. Moderate degenerative changes of the C1-2 articulation with mild degenerative pannus.   Soft tissues and spinal canal: No prevertebral fluid or swelling. No visible canal hematoma.   Disc levels: Multilevel cervical spondylosis, worst at C4-5 and C5-6, where there is at least mild spinal canal stenosis.   Upper chest: No acute findings.   Other: Atherosclerotic calcifications of the carotid bulbs.    IMPRESSION: 1. No acute intracranial abnormality. 2. No acute cervical spine fracture or traumatic malalignment. 3. Multilevel cervical spondylosis, worst at C4-5 and C5-6, where there is at least mild spinal canal stenosis.  I have personally reviewed the images and agree with the above interpretation.  Assessment and Plan: Sandra Stout is a pleasant 78 y.o. female is here today with a chief complaint of chronic low back pain as well as vaginal pain.  She denies any radiation to either lower extremity.  She states primarily the radiation it is to her private area.  She adds that she feels constipated all the time and has intense constant sharp pain in her groin area.  She denies numbness in the area or incontinence to bowel or bladder.  On examination patient is full strength however she does have some tenderness to palpation of lumbar spine and coccyx.  Pleasure to see patient today.  Plan to rule out any potential lumbar  causes of her pelvic pain and GI changes as well as lumbar spine pain that has been ongoing.  Plan includes the following moving forward:  -Dynamic x-rays of lumbar spine - MRI of lumbar spine and pelvis given severe pain that has been ongoing and is also associated with weight loss.  Patient and daughter were counseled on malnutrition and the importance of increasing patient's caloric intake. - Will review results once complete. - Referral to gynecology for pelvic pain for second opinion.   I spent a total of 45 minutes in both face-to-face and non-face-to-face activities for this visit on the date of this encounter including preparing to see the patient, obtaining and reviewing separately obtained history, performing medically appropriate examination, counseling the patient and their family, ordering additional tests, documenting clinical information, independently interpreting results, coordination of care.    Lyle Decamp, PA-C Dept. of Neurosurgery

## 2023-10-01 ENCOUNTER — Ambulatory Visit: Admitting: Physician Assistant

## 2023-10-01 VITALS — Wt 96.8 lb

## 2023-10-01 DIAGNOSIS — M545 Low back pain, unspecified: Secondary | ICD-10-CM | POA: Diagnosis not present

## 2023-10-01 DIAGNOSIS — R102 Pelvic and perineal pain: Secondary | ICD-10-CM | POA: Diagnosis not present

## 2023-10-17 ENCOUNTER — Other Ambulatory Visit

## 2023-10-20 ENCOUNTER — Other Ambulatory Visit

## 2023-10-21 ENCOUNTER — Ambulatory Visit
Admission: RE | Admit: 2023-10-21 | Discharge: 2023-10-21 | Disposition: A | Discharge status: Home / Self Care | Source: Ambulatory Visit | Attending: Physician Assistant | Admitting: Physician Assistant

## 2023-10-21 DIAGNOSIS — M48061 Spinal stenosis, lumbar region without neurogenic claudication: Secondary | ICD-10-CM | POA: Diagnosis not present

## 2023-10-21 DIAGNOSIS — M47817 Spondylosis without myelopathy or radiculopathy, lumbosacral region: Secondary | ICD-10-CM | POA: Diagnosis not present

## 2023-10-21 DIAGNOSIS — R102 Pelvic and perineal pain: Secondary | ICD-10-CM

## 2023-10-21 DIAGNOSIS — G8929 Other chronic pain: Secondary | ICD-10-CM

## 2023-10-21 DIAGNOSIS — M5127 Other intervertebral disc displacement, lumbosacral region: Secondary | ICD-10-CM | POA: Diagnosis not present

## 2023-10-24 ENCOUNTER — Other Ambulatory Visit

## 2023-10-24 ENCOUNTER — Ambulatory Visit: Payer: Self-pay | Admitting: Physician Assistant

## 2023-10-24 ENCOUNTER — Ambulatory Visit

## 2023-11-04 ENCOUNTER — Other Ambulatory Visit: Payer: Self-pay | Admitting: Physician Assistant

## 2023-11-04 DIAGNOSIS — M545 Low back pain, unspecified: Secondary | ICD-10-CM

## 2023-11-04 NOTE — Telephone Encounter (Signed)
 Patient called our office and I read her the MyChart messages from [place]. She would like to try physical therapy and injections. She states that she is having a lot of pain in her pelvis from the time she gets up in the morning and does not know what could be causing this pain.   She asked that we give her daughter, Madelin a call with a response.

## 2023-12-01 ENCOUNTER — Ambulatory Visit: Admitting: Family

## 2023-12-01 ENCOUNTER — Encounter: Payer: Self-pay | Admitting: Family

## 2023-12-01 VITALS — BP 138/78 | HR 102 | Ht 62.0 in | Wt 101.2 lb

## 2023-12-01 DIAGNOSIS — F419 Anxiety disorder, unspecified: Secondary | ICD-10-CM | POA: Diagnosis not present

## 2023-12-01 DIAGNOSIS — Z79899 Other long term (current) drug therapy: Secondary | ICD-10-CM

## 2023-12-01 DIAGNOSIS — Z0130 Encounter for examination of blood pressure without abnormal findings: Secondary | ICD-10-CM

## 2023-12-03 LAB — TOXASSURE SELECT 13 (MW), URINE

## 2023-12-07 NOTE — Progress Notes (Signed)
 Established Patient Office Visit  Subjective:  Patient ID: Sandra Stout, female    DOB: Aug 06, 1945  Age: 78 y.o. MRN: [id number]  Chief Complaint  Patient presents with   Follow-up    Discuss anxiety    Patient is here today to discuss her anxiety medications.  She has been taking alprazolam  for years, says that she had been getting it from Dr. Jerene previously, but she for some reason started getting it from a psychiatry provider a few years ago, and now they aren't seeing her any more so she needs somewhere to get her medication.     Anxiety Presents for follow-up visit. Symptoms include chest pain, decreased concentration, excessive worry, irritability, nervous/anxious behavior, palpitations, panic, restlessness and shortness of breath. Primary symptoms comment: tremors. Symptoms occur constantly. The severity of symptoms is severe, causing significant distress, interfering with daily activities and incapacitating. The quality of sleep is poor.      No other concerns at this time.   Past Medical History:  Diagnosis Date   Acute respiratory failure with hypoxia (HCC) 10/06/2018   ADD (attention deficit disorder)    Anxiety    Community acquired pneumonia 10/06/2018   Hemorrhoid    Hypokalemia    Hypokalemia 03/08/2021   Narcolepsy    Panic attacks    Pneumonia    Right middle lobe pneumonia 03/07/2021   Upper respiratory tract infection 03/02/2021   Wheezing 03/02/2021    Past Surgical History:  Procedure Laterality Date   CATARACT EXTRACTION W/PHACO Right 02/06/2023   Procedure: CATARACT EXTRACTION PHACO AND INTRAOCULAR LENS PLACEMENT (IOC) RIGHT  15.38 01:21.3;  Surgeon: Enola Feliciano Hugger, MD;  Location: [facility];  Service: Ophthalmology;  Laterality: Right;   CATARACT EXTRACTION W/PHACO Left 02/20/2023   Procedure: CATARACT EXTRACTION PHACO AND INTRAOCULAR LENS PLACEMENT (IOC) LEFT VISION BLUE 24.75 02:02.3;  Surgeon: Enola Feliciano Hugger, MD;   Location: [facility];  Service: Ophthalmology;  Laterality: Left;   TUBAL LIGATION      Social History   Socioeconomic History   Marital status: Married    Spouse name: Not on file   Number of children: Not on file   Years of education: Not on file   Highest education level: Not on file  Occupational History   Occupation: retired  Tobacco Use   Smoking status: Every Day    Current packs/day: 0.50    Types: Cigarettes    Passive exposure: Past   Smokeless tobacco: Never  Vaping Use   Vaping status: Never Used  Substance and Sexual Activity   Alcohol use: No   Drug use: No   Sexual activity: Not Currently    Birth control/protection: Post-menopausal, Surgical  Other Topics Concern   Not on file  Social History Narrative   Not on file   Social Drivers of Health   Financial Resource Strain: Low Risk  (07/11/2021)   Overall Financial Resource Strain (CARDIA)    Difficulty of Paying Living Expenses: Not hard at all  Food Insecurity: No Food Insecurity (02/27/2023)   Hunger Vital Sign    Worried About Running Out of Food in the Last Year: Never true    Ran Out of Food in the Last Year: Never true  Transportation Needs: No Transportation Needs (02/27/2023)   PRAPARE - Administrator, Civil Service (Medical): No    Lack of Transportation (Non-Medical): No  Physical Activity: Inactive (07/11/2021)   Exercise Vital Sign    Days of Exercise per  Week: 0 days    Minutes of Exercise per Session: 0 min  Stress: Stress Concern Present (07/11/2021)   [employer] of Occupational Health - Occupational Stress Questionnaire    Feeling of Stress : To some extent  Social Connections: Socially Isolated (02/27/2023)   Social Connection and Isolation Panel    Frequency of Communication with Friends and Family: More than three times a week    Frequency of Social Gatherings with Friends and Family: Once a week    Attends Religious Services: Never    Database administrator  or Organizations: No    Attends Banker Meetings: Never    Marital Status: Widowed  Intimate Partner Violence: Unknown (02/27/2023)   Humiliation, Afraid, Rape, and Kick questionnaire    Fear of Current or Ex-Partner: No    Emotionally Abused: No    Physically Abused: Not on file    Sexually Abused: No    Family History  Problem Relation Age of Onset   Lung disease Mother    Thyroid  disease Sister    Lung disease Sister    Cancer Sister        Bladder   Heart disease Father    Breast cancer Neg Hx     Allergies  Allergen Reactions   Baclofen Other (See Comments)    Review of Systems  Constitutional:  Positive for irritability.  Respiratory:  Positive for shortness of breath.   Cardiovascular:  Positive for chest pain and palpitations.  Psychiatric/Behavioral:  Positive for decreased concentration. The patient is nervous/anxious.   All other systems reviewed and are negative.      Objective:   BP 138/78   Pulse (!) 102   Ht 5' 2 (1.575 m)   Wt 101 lb 3.2 oz (45.9 kg)   LMP  (LMP Unknown)   SpO2 94%   BMI 18.51 kg/m   Vitals:   12/01/23 1422  BP: 138/78  Pulse: (!) 102  Height: 5' 2 (1.575 m)  Weight: 101 lb 3.2 oz (45.9 kg)  SpO2: 94%  BMI (Calculated): 18.51    Physical Exam Vitals and nursing note reviewed.  Constitutional:      Appearance: Normal appearance. She is normal weight.  HENT:     Head: Normocephalic.  Eyes:     Extraocular Movements: Extraocular movements intact.     Conjunctiva/sclera: Conjunctivae normal.     Pupils: Pupils are equal, round, and reactive to light.  Cardiovascular:     Rate and Rhythm: Normal rate.  Pulmonary:     Effort: Pulmonary effort is normal.  Neurological:     General: No focal deficit present.     Mental Status: She is alert and oriented to person, place, and time. Mental status is at baseline.  Psychiatric:        Mood and Affect: Mood normal.        Behavior: Behavior normal.         Thought Content: Thought content normal.      Results for orders placed or performed in visit on 12/01/23  Tox[employer] Select 13 (MW), Urine  Result Value Ref Range   Summary FINAL     Recent Results (from the past 2160 hours)  ToxAS[employer] Select 13 (MW), Urine     Status: None   Collection Time: 12/01/23  3:42 PM  Result Value Ref Range   Summary FINAL     Comment: ==================================================================== Tox[employer] Select 13 (MW) ==================================================================== Test  Result       Flag       Units  Drug Present   Clonazepam                      54                      ng/mg creat   7-aminoclonazepam              780                     ng/mg creat    Source of clonazepam  is a scheduled prescription medication. 7-    aminoclonazepam is an expected metabolite of clonazepam .    Benzoylecgonine                130                     ng/mg creat    Benzoylecgonine is a metabolite of cocaine; its presence indicates    use of this drug.  Source is most commonly illicit, but cocaine is    present in some topical anesthetic solutions.  ==================================================================== Test                      Result    Flag   Units      Ref Range   Creatinine              46               mg/dL      >=79 ======================== ============================================ Declared Medications:  Medication list was not provided. ==================================================================== For clinical consultation, please call [phone]. ====================================================================        Assessment & Plan Anxiety Long term current use of therapeutic drug UDS obtained in office today.  Refill sent for alprazolam .   Will reassess at follow up.   Also setting up referral to psychiatry for pt.      Return in about 1 month (around  01/01/2024).   Total time spent: 20 minutes  ALAN CHRISTELLA ARRANT, FNP  12/01/2023   This document may have been prepared by [facility] Voice Recognition software and as such may include unintentional dictation errors.

## 2023-12-07 NOTE — Assessment & Plan Note (Signed)
 UDS obtained in office today.  Refill sent for alprazolam .   Will reassess at follow up.   Also setting up referral to psychiatry for pt.

## 2023-12-16 ENCOUNTER — Ambulatory Visit: Admitting: Student in an Organized Health Care Education/Training Program

## 2023-12-18 ENCOUNTER — Encounter: Admitting: Advanced Practice Midwife

## 2023-12-26 ENCOUNTER — Ambulatory Visit: Payer: Self-pay

## 2024-01-02 ENCOUNTER — Encounter: Payer: Self-pay | Admitting: Family

## 2024-01-02 ENCOUNTER — Ambulatory Visit: Admitting: Family

## 2024-01-02 VITALS — BP 118/76 | HR 110 | Ht 62.0 in | Wt 100.0 lb

## 2024-01-02 DIAGNOSIS — J449 Chronic obstructive pulmonary disease, unspecified: Secondary | ICD-10-CM

## 2024-01-02 DIAGNOSIS — R5382 Chronic fatigue, unspecified: Secondary | ICD-10-CM

## 2024-01-02 DIAGNOSIS — K219 Gastro-esophageal reflux disease without esophagitis: Secondary | ICD-10-CM | POA: Diagnosis not present

## 2024-01-02 DIAGNOSIS — I951 Orthostatic hypotension: Secondary | ICD-10-CM | POA: Diagnosis not present

## 2024-01-02 DIAGNOSIS — F419 Anxiety disorder, unspecified: Secondary | ICD-10-CM

## 2024-01-02 DIAGNOSIS — N8110 Cystocele, unspecified: Secondary | ICD-10-CM

## 2024-01-02 DIAGNOSIS — F331 Major depressive disorder, recurrent, moderate: Secondary | ICD-10-CM

## 2024-01-02 DIAGNOSIS — Z131 Encounter for screening for diabetes mellitus: Secondary | ICD-10-CM

## 2024-01-02 DIAGNOSIS — N761 Subacute and chronic vaginitis: Secondary | ICD-10-CM

## 2024-01-02 DIAGNOSIS — G47419 Narcolepsy without cataplexy: Secondary | ICD-10-CM | POA: Diagnosis not present

## 2024-01-02 DIAGNOSIS — Z79899 Other long term (current) drug therapy: Secondary | ICD-10-CM

## 2024-01-02 NOTE — Assessment & Plan Note (Signed)
-   Continue medications as prescribed. FU if symptoms worsen or she has thoughts of suicide ideation, self harm or harm of others. - Discussed healthy coping mechanisms and stress reduction.  - Referral for medication management sent to psychiatrist.

## 2024-01-02 NOTE — Assessment & Plan Note (Signed)
 Continue medications as prescribed.

## 2024-01-02 NOTE — Progress Notes (Signed)
 Established Patient Office Visit  Subjective:  Patient ID: Sandra Stout, female    DOB: May 26, 1945  Age: 78 y.o. MRN: [id number]  Chief Complaint  Patient presents with   Follow-up    1 month follow up    Patient is here today for her 1 month follow up.  She has been feeling poorly since last appointment.   She does have additional concerns to discuss today. Patient endorses vaginal pain, severe burning. She reports the pain so severe that she can not get out of bed most days. She reports increased vaginal discharge but is unsure of the color. Denies vaginal bleeding. She does endorse vaginal malodor.  She had pessaries appliance by OBGYN and reports it was recently removed. She has hx of vaginal prolapse. Will do labs today and include urine sample and vaginal swab. On 09/2023 neurosurgery did MRI spine and pelvis that showed severe changes in the lumbar region of her spine which could be causing referred pain into her pelvis area. She has not followed back up with them. This is another factor contributing to her symptoms and recommend patient contact neurosurgeon for urgent FU. Will provide her with their contact information to make an appointment.  She endorses worsening anxiety. She reports she has not had her medications in months and her symptoms were severe enough that she got anxiety medications from a friend. She reports feeling like a zombie and that the anxiety medication she got has been helping her symptoms and helping her focus. She reports she has seen so many psychiatrist that she does not want to continue seeing mental health specialists because she feels as if no one is helping her. She reports if she can't have Xanax  refilled she is willing to try something else to help improve her anxiety and depression. She denies suicidal thoughts or self harm at this time. Will send in short term prescription for her Xanax  while we find a provider who can continue writing this  prescription for her. Discussed recent urinary tox screen with patient. Will recheck tox screen today with labs.   Labs are due today.  She needs refills. Patient informed today that long term management of her controlled substances has to be taken over by a psychiatrist.  I have reviewed her active problem list, medication list, allergies, family history, social history, health maintenance, notes from last encounter, lab results for her appointment today.    Patient declines wanting a flu shot today.    No other concerns at this time.   Past Medical History:  Diagnosis Date   Acute respiratory failure with hypoxia (HCC) 10/06/2018   ADD (attention deficit disorder)    Anxiety    Community acquired pneumonia 10/06/2018   Hemorrhoid    Hypokalemia    Hypokalemia 03/08/2021   Narcolepsy    Panic attacks    Pneumonia    Right middle lobe pneumonia 03/07/2021   Upper respiratory tract infection 03/02/2021   Wheezing 03/02/2021    Past Surgical History:  Procedure Laterality Date   CATARACT EXTRACTION W/PHACO Right 02/06/2023   Procedure: CATARACT EXTRACTION PHACO AND INTRAOCULAR LENS PLACEMENT (IOC) RIGHT  15.38 01:21.3;  Surgeon: Enola Feliciano Hugger, MD;  Location: [facility];  Service: Ophthalmology;  Laterality: Right;   CATARACT EXTRACTION W/PHACO Left 02/20/2023   Procedure: CATARACT EXTRACTION PHACO AND INTRAOCULAR LENS PLACEMENT (IOC) LEFT VISION BLUE 24.75 02:02.3;  Surgeon: Enola Feliciano Hugger, MD;  Location: [facility];  Service: Ophthalmology;  Laterality: Left;  TUBAL LIGATION      Social History   Socioeconomic History   Marital status: Married    Spouse name: Not on file   Number of children: Not on file   Years of education: Not on file   Highest education level: Not on file  Occupational History   Occupation: retired  Tobacco Use   Smoking status: Every Day    Current packs/day: 0.50    Types: Cigarettes    Passive exposure:  Past   Smokeless tobacco: Never  Vaping Use   Vaping status: Never Used  Substance and Sexual Activity   Alcohol use: No   Drug use: No   Sexual activity: Not Currently    Birth control/protection: Post-menopausal, Surgical  Other Topics Concern   Not on file  Social History Narrative   Not on file   Social Drivers of Health   Financial Resource Strain: Low Risk  (07/11/2021)   Overall Financial Resource Strain (CARDIA)    Difficulty of Paying Living Expenses: Not hard at all  Food Insecurity: No Food Insecurity (02/27/2023)   Hunger Vital Sign    Worried About Running Out of Food in the Last Year: Never true    Ran Out of Food in the Last Year: Never true  Transportation Needs: No Transportation Needs (02/27/2023)   PRAPARE - Administrator, Civil Service (Medical): No    Lack of Transportation (Non-Medical): No  Physical Activity: Inactive (07/11/2021)   Exercise Vital Sign    Days of Exercise per Week: 0 days    Minutes of Exercise per Session: 0 min  Stress: Stress Concern Present (07/11/2021)   [employer] of Occupational Health - Occupational Stress Questionnaire    Feeling of Stress : To some extent  Social Connections: Socially Isolated (02/27/2023)   Social Connection and Isolation Panel    Frequency of Communication with Friends and Family: More than three times a week    Frequency of Social Gatherings with Friends and Family: Once a week    Attends Religious Services: Never    Database Administrator or Organizations: No    Attends Banker Meetings: Never    Marital Status: Widowed  Intimate Partner Violence: Unknown (02/27/2023)   Humiliation, Afraid, Rape, and Kick questionnaire    Fear of Current or Ex-Partner: No    Emotionally Abused: No    Physically Abused: Not on file    Sexually Abused: No    Family History  Problem Relation Age of Onset   Lung disease Mother    Thyroid  disease Sister    Lung disease Sister    Cancer Sister         Bladder   Heart disease Father    Breast cancer Neg Hx     Allergies  Allergen Reactions   Baclofen Other (See Comments)    Review of Systems  Constitutional:  Negative for malaise/fatigue.  HENT: Negative.    Eyes:  Negative for blurred vision and pain.  Respiratory:  Negative for cough and shortness of breath.   Cardiovascular:  Negative for chest pain, palpitations, claudication and leg swelling.  Gastrointestinal:  Negative for abdominal pain, blood in stool, constipation, diarrhea, nausea and vomiting.  Genitourinary:  Positive for dysuria, frequency and urgency.  Musculoskeletal:  Positive for back pain and myalgias.  Skin: Negative.   Neurological:  Negative for dizziness, tingling, sensory change and headaches.  Endo/Heme/Allergies: Negative.   Psychiatric/Behavioral:  Positive for depression. The patient is nervous/anxious.  Objective:   BP 118/76   Pulse (!) 110   Ht 5' 2 (1.575 m)   Wt 100 lb (45.4 kg)   LMP  (LMP Unknown)   SpO2 96%   BMI 18.29 kg/m   Vitals:   01/02/24 1309  BP: 118/76  Pulse: (!) 110  Height: 5' 2 (1.575 m)  Weight: 100 lb (45.4 kg)  SpO2: 96%  BMI (Calculated): 18.29    Physical Exam Vitals and nursing note reviewed.  Constitutional:      Appearance: Normal appearance.  HENT:     Head: Normocephalic.  Eyes:     Extraocular Movements: Extraocular movements intact.     Pupils: Pupils are equal, round, and reactive to light.  Cardiovascular:     Rate and Rhythm: Normal rate and regular rhythm.     Pulses: Normal pulses.     Heart sounds: Normal heart sounds. No murmur heard. Pulmonary:     Effort: Pulmonary effort is normal. No respiratory distress.     Breath sounds: Normal breath sounds.  Abdominal:     General: There is no distension.     Tenderness: There is no abdominal tenderness.  Musculoskeletal:        General: No tenderness. Normal range of motion.     Cervical back: Normal range of motion and  neck supple.     Right lower leg: No edema.     Left lower leg: No edema.  Skin:    General: Skin is warm and dry.     Coloration: Skin is not jaundiced.     Findings: No erythema.  Neurological:     General: No focal deficit present.     Mental Status: She is alert and oriented to person, place, and time.  Psychiatric:        Mood and Affect: Mood normal.        Speech: Speech normal.        Behavior: Behavior is cooperative.        Cognition and Memory: Memory is not impaired.      No results found for any visits on 01/02/24.  Recent Results (from the past 2160 hours)  ToxASSURE Select 13 (MW), Urine     Status: None   Collection Time: 12/01/23  3:42 PM  Result Value Ref Range   Summary FINAL     Comment: ==================================================================== ToxASSURE Select 13 (MW) ==================================================================== Test                             Result       Flag       Units  Drug Present   Clonazepam                      54                      ng/mg creat   7-aminoclonazepam              780                     ng/mg creat    Source of clonazepam  is a scheduled prescription medication. 7-    aminoclonazepam is an expected metabolite of clonazepam .    Benzoylecgonine                130  ng/mg creat    Benzoylecgonine is a metabolite of cocaine; its presence indicates    use of this drug.  Source is most commonly illicit, but cocaine is    present in some topical anesthetic solutions.  ==================================================================== Test                      Result    Flag   Units      Ref Range   Creatinine              46               mg/dL      >=79 ======================== ============================================ Declared Medications:  Medication list was not provided. ==================================================================== For clinical consultation,  please call [phone]. ====================================================================        Assessment & Plan:   Assessment & Plan Chronic obstructive pulmonary disease, unspecified COPD type (HCC) - Continue medications as prescribed. Gastroesophageal reflux disease without esophagitis - Continue medications as prescribed. Primary narcolepsy without cataplexy Anxiety Moderate episode of recurrent major depressive disorder (HCC) Long term current use of therapeutic drug - Continue medications as prescribed. FU if symptoms worsen or she has thoughts of suicide ideation, self harm or harm of others. - Discussed healthy coping mechanisms and stress reduction.  - Referral for medication management sent to psychiatrist. Vaginal prolapse Chronic vaginitis - Check UA, Check Nuswab. Orthostatic hypotension Screening for diabetes mellitus - Check labs today -Reinforced healthy diet and exercise as tolerated. Chronic fatigue - Check labs today,    Return in about 4 weeks (around 01/30/2024).   Total time spent: 50 minutes  Sandra DELENA Cain, FNP  01/02/2024   This document may have been prepared by [facility] Voice Recognition software and as such may include unintentional dictation errors.

## 2024-01-02 NOTE — Assessment & Plan Note (Signed)
-   Check labs today -Reinforced healthy diet and exercise as tolerated.

## 2024-01-02 NOTE — Patient Instructions (Signed)
 Call: [facility] at [facility] [phone] Provider Lyle Decamp about MRI results and lumbar pain.  Today: checked labs, Nuswab, UA

## 2024-01-02 NOTE — Assessment & Plan Note (Signed)
-   Check UA, Check Nuswab.

## 2024-01-02 NOTE — Assessment & Plan Note (Signed)
-   Check labs today,

## 2024-01-03 ENCOUNTER — Encounter: Payer: Self-pay | Admitting: Family

## 2024-01-03 ENCOUNTER — Ambulatory Visit: Payer: Self-pay

## 2024-01-03 LAB — VITAMIN B12: Vitamin B-12: >2000 pg/mL — ABNORMAL HIGH (ref 232–1245)

## 2024-01-03 LAB — CBC WITH DIFFERENTIAL/PLATELET
Basophils Absolute: 0 x10E3/uL (ref 0.0–0.2)
Basos: 1 %
EOS (ABSOLUTE): 0 x10E3/uL (ref 0.0–0.4)
Eos: 1 %
Hematocrit: 41.3 % (ref 34.0–46.6)
Hemoglobin: 13.5 g/dL (ref 11.1–15.9)
Immature Grans (Abs): 0 x10E3/uL (ref 0.0–0.1)
Immature Granulocytes: 0 %
Lymphocytes Absolute: 2.4 x10E3/uL (ref 0.7–3.1)
Lymphs: 34 %
MCH: 32.4 pg (ref 26.6–33.0)
MCHC: 32.7 g/dL (ref 31.5–35.7)
MCV: 99 fL — ABNORMAL HIGH (ref 79–97)
Monocytes Absolute: 0.6 x10E3/uL (ref 0.1–0.9)
Monocytes: 8 %
Neutrophils Absolute: 4 x10E3/uL (ref 1.4–7.0)
Neutrophils: 56 %
Platelets: 341 x10E3/uL (ref 150–450)
RBC: 4.17 x10E6/uL (ref 3.77–5.28)
RDW: 12 % (ref 11.7–15.4)
WBC: 7.1 x10E3/uL (ref 3.4–10.8)

## 2024-01-03 LAB — CMP14+EGFR
ALT: 13 IU/L (ref 0–32)
AST: 21 IU/L (ref 0–40)
Albumin: 4.8 g/dL (ref 3.8–4.8)
Alkaline Phosphatase: 64 IU/L (ref 49–135)
BUN/Creatinine Ratio: 19 (ref 12–28)
BUN: 19 mg/dL (ref 8–27)
Bilirubin Total: <0.2 mg/dL (ref 0.0–1.2)
CO2: 23 mmol/L (ref 20–29)
Calcium: 10.3 mg/dL (ref 8.7–10.3)
Chloride: 100 mmol/L (ref 96–106)
Creatinine, Ser: 1.01 mg/dL — ABNORMAL HIGH (ref 0.57–1.00)
Globulin, Total: 2.1 g/dL (ref 1.5–4.5)
Glucose: 93 mg/dL (ref 70–99)
Potassium: 4.2 mmol/L (ref 3.5–5.2)
Sodium: 140 mmol/L (ref 134–144)
Total Protein: 6.9 g/dL (ref 6.0–8.5)
eGFR: 57 mL/min/1.73 — ABNORMAL LOW (ref >59)

## 2024-01-03 LAB — LIPID PANEL W/O CHOL/HDL RATIO
Cholesterol, Total: 231 mg/dL — ABNORMAL HIGH (ref 100–199)
HDL: 70 mg/dL (ref >39)
LDL Chol Calc (NIH): 147 mg/dL — ABNORMAL HIGH (ref 0–99)
Triglycerides: 80 mg/dL (ref 0–149)
VLDL Cholesterol Cal: 14 mg/dL (ref 5–40)

## 2024-01-03 LAB — HEMOGLOBIN A1C
Est. average glucose Bld gHb Est-mCnc: 103 mg/dL
Hgb A1c MFr Bld: 5.2 % (ref 4.8–5.6)

## 2024-01-03 LAB — VITAMIN D 25 HYDROXY (VIT D DEFICIENCY, FRACTURES): Vit D, 25-Hydroxy: 99.5 ng/mL (ref 30.0–100.0)

## 2024-01-07 LAB — NUSWAB VG+, HSV
Candida albicans, NAA: NEGATIVE
Candida glabrata, NAA: NEGATIVE
HSV 1 NAA: NEGATIVE
HSV 2 NAA: NEGATIVE

## 2024-01-08 NOTE — Progress Notes (Signed)
 Vm is full.

## 2024-01-13 NOTE — Progress Notes (Signed)
 Vm full

## 2024-01-28 ENCOUNTER — Ambulatory Visit: Admitting: Advanced Practice Midwife

## 2024-01-28 ENCOUNTER — Other Ambulatory Visit: Payer: Self-pay

## 2024-01-28 ENCOUNTER — Encounter: Payer: Self-pay | Admitting: Advanced Practice Midwife

## 2024-01-28 VITALS — BP 129/83 | HR 106 | Ht 63.0 in | Wt 101.3 lb

## 2024-01-28 DIAGNOSIS — R300 Dysuria: Secondary | ICD-10-CM

## 2024-01-28 DIAGNOSIS — R102 Pelvic and perineal pain unspecified side: Secondary | ICD-10-CM | POA: Diagnosis not present

## 2024-01-28 LAB — POCT URINALYSIS DIPSTICK
Bilirubin, UA: NEGATIVE
Blood, UA: NEGATIVE
Glucose, UA: NEGATIVE
Ketones, UA: NEGATIVE
Leukocytes, UA: NEGATIVE
Nitrite, UA: NEGATIVE
Protein, UA: NEGATIVE
Spec Grav, UA: 1.01 (ref 1.010–1.025)
Urobilinogen, UA: 0.2 U/dL
pH, UA: 5 (ref 5.0–8.0)

## 2024-01-28 NOTE — Progress Notes (Signed)
 Patient ID: Sandra Stout, female   DOB: 12/10/1945, 78 y.o.   MRN: [id number]  Reason for Visit: Vaginal/Pelvic Pain   Subjective:  HPI:  Sandra Stout is a 78 y.o. female accompanied by her granddaughter has complaint of ongoing- about 1 year- pelvic area pain. The constant pain is low front/middle pelvic that is stabbing and pressure. In the last couple of days she has burning with urination. She also mentions constipation and low back pain. She had a Pessary placed at [facility] in September of 2023 and then had it removed a month later due to ongoing pain issue.  MRI done in August of this year was unremarkable for bladder and uterus. Remarkable for colonic diverticulosis and enlarged pelvic lymph nodes.  Will send urine for culture. Spoke with Dr Leigh who recommends follow up with GI and colon cancer screening as well as pelvic pain specialist based on findings of recent MRI.  Past Medical History:  Diagnosis Date   Acute respiratory failure with hypoxia (HCC) 10/06/2018   ADD (attention deficit disorder)    Anxiety    Community acquired pneumonia 10/06/2018   Hemorrhoid    Hypokalemia    Hypokalemia 03/08/2021   Narcolepsy    Panic attacks    Pneumonia    Right middle lobe pneumonia 03/07/2021   Upper respiratory tract infection 03/02/2021   Wheezing 03/02/2021   Family History  Problem Relation Age of Onset   Lung disease Mother    Thyroid  disease Sister    Lung disease Sister    Cancer Sister        Bladder   Heart disease Father    Breast cancer Neg Hx    Past Surgical History:  Procedure Laterality Date   CATARACT EXTRACTION W/PHACO Right 02/06/2023   Procedure: CATARACT EXTRACTION PHACO AND INTRAOCULAR LENS PLACEMENT (IOC) RIGHT  15.38 01:21.3;  Surgeon: Enola Feliciano Hugger, MD;  Location: [facility];  Service: Ophthalmology;  Laterality: Right;   CATARACT EXTRACTION W/PHACO Left 02/20/2023   Procedure: CATARACT EXTRACTION PHACO AND INTRAOCULAR LENS  PLACEMENT (IOC) LEFT VISION BLUE 24.75 02:02.3;  Surgeon: Enola Feliciano Hugger, MD;  Location: [facility];  Service: Ophthalmology;  Laterality: Left;   TUBAL LIGATION      Short Social History:  Social History   Tobacco Use   Smoking status: Every Day    Current packs/day: 0.50    Types: Cigarettes    Passive exposure: Past   Smokeless tobacco: Never  Substance Use Topics   Alcohol use: No    Allergies  Allergen Reactions   Baclofen Other (See Comments)    Current Outpatient Medications  Medication Sig Dispense Refill   methylphenidate  (RITALIN ) 20 MG tablet Take 3-4 tablets by mouth. Per day in divided doses     ALPRAZolam  (XANAX ) 1 MG tablet Take 1 mg by mouth 2 (two) times daily as needed for anxiety. (Patient not taking: Reported on 01/28/2024)     cholecalciferol (VITAMIN D3) 25 MCG (1000 UNIT) tablet Take 1,000 Units by mouth daily. (Patient not taking: Reported on 01/28/2024)     mirtazapine  (REMERON ) 30 MG tablet Take 15 mg by mouth at bedtime. (Patient not taking: Reported on 01/28/2024)     Omega-3 Fatty Acids (FISH OIL) 1000 MG CAPS Take by mouth. (Patient not taking: Reported on 01/28/2024)     Vitamins-Lipotropics (BALANCED B-50) TABS Take 1 tablet by mouth daily. (Patient not taking: Reported on 01/28/2024)     No current facility-administered medications for  this visit.    Review of Systems  Constitutional:  Negative for chills and fever.  HENT:  Negative for congestion, ear discharge, ear pain, hearing loss, sinus pain and sore throat.   Eyes:  Negative for blurred vision and double vision.  Respiratory:  Negative for cough, shortness of breath and wheezing.   Cardiovascular:  Negative for chest pain, palpitations and leg swelling.  Gastrointestinal:  Positive for constipation. Negative for abdominal pain, blood in stool, diarrhea, heartburn, melena, nausea and vomiting.  Genitourinary:  Positive for dysuria. Negative for flank pain, frequency,  hematuria and urgency.       Positive for pelvic pain  Musculoskeletal:  Positive for back pain. Negative for joint pain and myalgias.  Skin:  Negative for itching and rash.  Neurological:  Negative for dizziness, tingling, tremors, sensory change, speech change, focal weakness, seizures, loss of consciousness, weakness and headaches.  Endo/Heme/Allergies:  Negative for environmental allergies. Does not bruise/bleed easily.  Psychiatric/Behavioral:  Negative for depression, hallucinations, memory loss, substance abuse and suicidal ideas. The patient is not nervous/anxious and does not have insomnia.        Positive for anxiety       Objective:  Objective   Vitals:   01/28/24 0920  BP: 129/83  Pulse: (!) 106  Weight: 101 lb 4.8 oz (45.9 kg)  Height: 5' 3 (1.6 m)   Body mass index is 17.94 kg/m. Constitutional: thin female in mild acute distress.  HEENT: normal Skin: Warm and dry.  Cardiovascular: Regular rate and rhythm.   Respiratory:  Normal respiratory effort Abdomen: soft, nontender, nondistended, no abnormal masses, no epigastric pain Back: no CVAT Psych: Alert and Oriented x3. No memory deficits. Normal mood and affect.    Pelvic exam: (female chaperone present) is not limited by body habitus EGBUS: within normal limits Vagina: atrophy of mucosa, no significant prolapse of bladder, uterus, or bowel Patient able to tolerate exam, however, painful   Data:  Latest Reference Range & Units 01/28/24 13:15  Bilirubin, UA  negative  Clarity, UA  clear  Color, UA  yellow  Glucose Negative  Negative  Ketones, UA  negative  Leukocytes,UA Negative  Negative  Nitrite, UA  negative  pH, UA 5.0 - 8.0  5.0  Protein,UA Negative  Negative  Specific Gravity, UA 1.010 - 1.025  1.010  Urobilinogen, UA 0.2 or 1.0 E.U./dL 0.2  RBC, UA  negative       Assessment/Plan:     78 y.o. G2 P2 female with pelvic pain, normal gyn MRI, possible UTI  Recommend follow up with pelvic  pain specialist, GI follow up and colon cancer screening   Slater Rains, CNM [facility] 01/28/2024 7:42 PM

## 2024-01-28 NOTE — Patient Instructions (Signed)
 Pelvic Pain, Female Pelvic pain is pain in your lower abdomen, below your belly button and between your hips. The pain may start suddenly (be acute), keep coming back (be recurring), or last a long time (become chronic). Pelvic pain that lasts longer than 6 months is considered chronic. Pelvic pain may affect your: Reproductive organs. Urinary system. Digestive tract. Musculoskeletal system. There are many potential causes of pelvic pain. Sometimes, the pain can be a result of digestive or urinary conditions, strained muscles or ligaments, or reproductive conditions. Sometimes the cause of pelvic pain is not known. Follow these instructions at home:  Take over-the-counter and prescription medicines only as told by your health care provider. Rest as told by your health care provider. Do not have sex if it hurts. Keep a journal of your pelvic pain. Write down: When the pain started. Where the pain is located. What seems to make the pain better or worse, such as food or your monthly period (menstrual cycle). Any symptoms you have along with the pain. Keep all follow-up visits. This is important. Contact a health care provider if: Medicine does not help your pain, or your pain comes back. You have new symptoms. You have abnormal vaginal discharge or bleeding, including bleeding after menopause. You have a fever or chills. You are constipated. You have blood in your urine or stool (feces). You have foul-smelling urine. You feel weak or light-headed. Get help right away if: You have sudden severe pain. Your pain gets steadily worse. You have severe pain along with fever, nausea, vomiting, or excessive sweating. You lose consciousness. These symptoms may represent a serious problem that is an emergency. Do not wait to see if the symptoms will go away. Get medical help right away. Call your local emergency services (911 in the [place] Do not drive yourself to the hospital. Summary Pelvic  pain is pain in your lower abdomen, below your belly button and between your hips. There are many potential causes of pelvic pain. Keep a journal of your pelvic pain. This information is not intended to replace advice given to you by your health care provider. Make sure you discuss any questions you have with your health care provider. Document Revised: 06/20/2020 Document Reviewed: 06/20/2020 Elsevier Patient Education  2024 [employer].

## 2024-01-30 LAB — URINE CULTURE

## 2024-02-02 ENCOUNTER — Ambulatory Visit: Admitting: Family

## 2024-02-02 ENCOUNTER — Ambulatory Visit: Payer: Self-pay | Admitting: Advanced Practice Midwife

## 2024-02-18 ENCOUNTER — Ambulatory Visit: Admitting: Family

## 2024-02-27 ENCOUNTER — Encounter: Payer: Self-pay | Admitting: Family

## 2024-02-27 ENCOUNTER — Ambulatory Visit: Admitting: Family

## 2024-02-27 VITALS — BP 130/76 | HR 112 | Ht 62.0 in | Wt 99.0 lb

## 2024-02-27 DIAGNOSIS — F03A0 Unspecified dementia, mild, without behavioral disturbance, psychotic disturbance, mood disturbance, and anxiety: Secondary | ICD-10-CM | POA: Insufficient documentation

## 2024-02-27 DIAGNOSIS — K5909 Other constipation: Secondary | ICD-10-CM

## 2024-02-27 DIAGNOSIS — F13239 Sedative, hypnotic or anxiolytic dependence with withdrawal, unspecified: Secondary | ICD-10-CM | POA: Insufficient documentation

## 2024-02-27 DIAGNOSIS — F331 Major depressive disorder, recurrent, moderate: Secondary | ICD-10-CM

## 2024-02-27 DIAGNOSIS — R935 Abnormal findings on diagnostic imaging of other abdominal regions, including retroperitoneum: Secondary | ICD-10-CM

## 2024-02-27 DIAGNOSIS — J449 Chronic obstructive pulmonary disease, unspecified: Secondary | ICD-10-CM

## 2024-02-27 MED ORDER — ALPRAZOLAM 1 MG PO TABS: 1.0000 mg | ORAL_TABLET | Freq: Two times a day (BID) | ORAL | 2 refills | Status: AC | PRN

## 2024-02-27 NOTE — Progress Notes (Unsigned)
 "  Established Patient Office Visit  Subjective:  Patient ID: Sandra Stout, female    DOB: 12/03/1945  Age: 79 y.o. MRN: [id number]  Chief Complaint  Patient presents with   Follow-up    1 month follow up    HPI  No other concerns at this time.   Past Medical History:  Diagnosis Date   Acute respiratory failure with hypoxia (HCC) 10/06/2018   ADD (attention deficit disorder)    Anxiety    Community acquired pneumonia 10/06/2018   Hemorrhoid    Hypokalemia    Hypokalemia 03/08/2021   Narcolepsy    Panic attacks    Pneumonia    Right middle lobe pneumonia 03/07/2021   Upper respiratory tract infection 03/02/2021   Wheezing 03/02/2021    Past Surgical History:  Procedure Laterality Date   CATARACT EXTRACTION W/PHACO Right 02/06/2023   Procedure: CATARACT EXTRACTION PHACO AND INTRAOCULAR LENS PLACEMENT (IOC) RIGHT  15.38 01:21.3;  Surgeon: Enola Feliciano Hugger, MD;  Location: [facility];  Service: Ophthalmology;  Laterality: Right;   CATARACT EXTRACTION W/PHACO Left 02/20/2023   Procedure: CATARACT EXTRACTION PHACO AND INTRAOCULAR LENS PLACEMENT (IOC) LEFT VISION BLUE 24.75 02:02.3;  Surgeon: Enola Feliciano Hugger, MD;  Location: [facility];  Service: Ophthalmology;  Laterality: Left;   TUBAL LIGATION      Social History   Socioeconomic History   Marital status: Married    Spouse name: Not on file   Number of children: Not on file   Years of education: Not on file   Highest education level: Not on file  Occupational History   Occupation: retired  Tobacco Use   Smoking status: Every Day    Current packs/day: 0.50    Types: Cigarettes    Passive exposure: Past   Smokeless tobacco: Never  Vaping Use   Vaping status: Never Used  Substance and Sexual Activity   Alcohol use: No   Drug use: No   Sexual activity: Not Currently    Birth control/protection: Post-menopausal, Surgical  Other Topics Concern   Not on file  Social History Narrative    Not on file   Social Drivers of Health   Tobacco Use: High Risk (02/27/2024)   Patient History    Smoking Tobacco Use: Every Day    Smokeless Tobacco Use: Never    Passive Exposure: Past  Financial Resource Strain: Low Risk (07/11/2021)   Overall Financial Resource Strain (CARDIA)    Difficulty of Paying Living Expenses: Not hard at all  Food Insecurity: No Food Insecurity (02/27/2023)   Hunger Vital Sign    Worried About Running Out of Food in the Last Year: Never true    Ran Out of Food in the Last Year: Never true  Transportation Needs: No Transportation Needs (02/27/2023)   PRAPARE - Administrator, Civil Service (Medical): No    Lack of Transportation (Non-Medical): No  Physical Activity: Inactive (07/11/2021)   Exercise Vital Sign    Days of Exercise per Week: 0 days    Minutes of Exercise per Session: 0 min  Stress: Stress Concern Present (07/11/2021)   [employer] of Occupational Health - Occupational Stress Questionnaire    Feeling of Stress : To some extent  Social Connections: Socially Isolated (02/27/2023)   Social Connection and Isolation Panel    Frequency of Communication with Friends and Family: More than three times a week    Frequency of Social Gatherings with Friends and Family: Once a week  Attends Religious Services: Never    Active Member of Clubs or Organizations: No    Attends Banker Meetings: Never    Marital Status: Widowed  Intimate Partner Violence: Unknown (02/27/2023)   Humiliation, Afraid, Rape, and Kick questionnaire    Fear of Current or Ex-Partner: No    Emotionally Abused: No    Physically Abused: Not on file    Sexually Abused: No  Depression (PHQ2-9): High Risk (01/02/2023)   Depression (PHQ2-9)    PHQ-2 Score: 11  Alcohol Screen: Low Risk (07/11/2021)   Alcohol Screen    Last Alcohol Screening Score (AUDIT): 0  Housing: Low Risk (02/27/2023)   Housing Stability Vital Sign    Unable to Pay for Housing in the Last  Year: No    Number of Times Moved in the Last Year: 0    Homeless in the Last Year: No  Utilities: At Risk (02/27/2023)   [employer] Utilities    Threatened with loss of utilities: Yes  Health Literacy: Not on file    Family History  Problem Relation Age of Onset   Lung disease Mother    Thyroid  disease Sister    Lung disease Sister    Cancer Sister        Bladder   Heart disease Father    Breast cancer Neg Hx     Allergies[1]  Review of Systems  All other systems reviewed and are negative.      Objective:   BP 130/76   Pulse (!) 112   Ht 5' 2 (1.575 m)   Wt 99 lb (44.9 kg)   LMP  (LMP Unknown)   SpO2 95%   BMI 18.11 kg/m   Vitals:   02/27/24 1109  BP: 130/76  Pulse: (!) 112  Height: 5' 2 (1.575 m)  Weight: 99 lb (44.9 kg)  SpO2: 95%  BMI (Calculated): 18.1    Physical Exam Vitals and nursing note reviewed.  Constitutional:      Appearance: Normal appearance. She is normal weight.  HENT:     Head: Normocephalic.  Eyes:     Extraocular Movements: Extraocular movements intact.     Conjunctiva/sclera: Conjunctivae normal.     Pupils: Pupils are equal, round, and reactive to light.  Cardiovascular:     Rate and Rhythm: Normal rate.  Pulmonary:     Effort: Pulmonary effort is normal.  Neurological:     General: No focal deficit present.     Mental Status: She is alert and oriented to person, place, and time. Mental status is at baseline.  Psychiatric:        Mood and Affect: Mood normal.        Behavior: Behavior normal.        Thought Content: Thought content normal.      No results found for any visits on 02/27/24.  Recent Results (from the past 2160 hours)  Tox[employer] Select 13 (MW), Urine     Status: None   Collection Time: 12/01/23  3:42 PM  Result Value Ref Range   Summary FINAL     Comment: ==================================================================== [employer] Select 13  (MW) ==================================================================== Test                             Result       Flag       Units  Drug Present   Clonazepam   54                      ng/mg creat   7-aminoclonazepam              780                     ng/mg creat    Source of clonazepam  is a scheduled prescription medication. 7-    aminoclonazepam is an expected metabolite of clonazepam .    Benzoylecgonine                130                     ng/mg creat    Benzoylecgonine is a metabolite of cocaine; its presence indicates    use of this drug.  Source is most commonly illicit, but cocaine is    present in some topical anesthetic solutions.  ==================================================================== Test                      Result    Flag   Units      Ref Range   Creatinine              46               mg/dL      >=79 ======================== ============================================ Declared Medications:  Medication list was not provided. ==================================================================== For clinical consultation, please call [phone]. ====================================================================   CBC with Diff     Status: Abnormal   Collection Time: 01/02/24  3:13 PM  Result Value Ref Range   WBC 7.1 3.4 - 10.8 x10E3/uL   RBC 4.17 3.77 - 5.28 x10E6/uL   Hemoglobin 13.5 11.1 - 15.9 g/dL   Hematocrit 58.6 65.9 - 46.6 %   MCV 99 (H) 79 - 97 fL   MCH 32.4 26.6 - 33.0 pg   MCHC 32.7 31.5 - 35.7 g/dL   RDW 87.9 88.2 - 84.5 %   Platelets 341 150 - 450 x10E3/uL   Neutrophils 56 Not Estab. %   Lymphs 34 Not Estab. %   Monocytes 8 Not Estab. %   Eos 1 Not Estab. %   Basos 1 Not Estab. %   Neutrophils Absolute 4.0 1.4 - 7.0 x10E3/uL   Lymphocytes Absolute 2.4 0.7 - 3.1 x10E3/uL   Monocytes Absolute 0.6 0.1 - 0.9 x10E3/uL   EOS (ABSOLUTE) 0.0 0.0 - 0.4 x10E3/uL   Basophils Absolute 0.0 0.0 - 0.2 x10E3/uL    Immature Granulocytes 0 Not Estab. %   Immature Grans (Abs) 0.0 0.0 - 0.1 x10E3/uL  CMP14+EGFR     Status: Abnormal   Collection Time: 01/02/24  3:13 PM  Result Value Ref Range   Glucose 93 70 - 99 mg/dL   BUN 19 8 - 27 mg/dL   Creatinine, Ser 8.98 (H) 0.57 - 1.00 mg/dL   eGFR 57 (L) >40 fO/fpw/8.26   BUN/Creatinine Ratio 19 12 - 28   Sodium 140 134 - 144 mmol/L   Potassium 4.2 3.5 - 5.2 mmol/L   Chloride 100 96 - 106 mmol/L   CO2 23 20 - 29 mmol/L   Calcium  10.3 8.7 - 10.3 mg/dL   Total Protein 6.9 6.0 - 8.5 g/dL   Albumin 4.8 3.8 - 4.8 g/dL   Globulin, Total 2.1 1.5 - 4.5 g/dL   Bilirubin Total <9.7 0.0 - 1.2 mg/dL   Alkaline Phosphatase 64 49 -  135 IU/L   AST 21 0 - 40 IU/L   ALT 13 0 - 32 IU/L  Lipid Panel w/o Chol/HDL Ratio     Status: Abnormal   Collection Time: 01/02/24  3:13 PM  Result Value Ref Range   Cholesterol, Total 231 (H) 100 - 199 mg/dL   Triglycerides 80 0 - 149 mg/dL   HDL 70 >60 mg/dL   VLDL Cholesterol Cal 14 5 - 40 mg/dL   LDL Chol Calc (NIH) 852 (H) 0 - 99 mg/dL  Hemoglobin J8r     Status: None   Collection Time: 01/02/24  3:13 PM  Result Value Ref Range   Hgb A1c MFr Bld 5.2 4.8 - 5.6 %    Comment:          Prediabetes: 5.7 - 6.4          Diabetes: >6.4          Glycemic control for adults with diabetes: <7.0    Est. average glucose Bld gHb Est-mCnc 103 mg/dL  Vitamin D  (25 hydroxy)     Status: None   Collection Time: 01/02/24  3:13 PM  Result Value Ref Range   Vit D, 25-Hydroxy 99.5 30.0 - 100.0 ng/mL    Comment: Vitamin D  deficiency has been defined by the Institute of Medicine and an Endocrine Society practice guideline as a level of serum 25-OH vitamin D  less than 20 ng/mL (1,2). The Endocrine Society went on to further define vitamin D  insufficiency as a level between 21 and 29 ng/mL (2). 1. IOM (Institute of Medicine). 2010. Dietary reference    intakes for calcium  and D. [place]: The    [employer]. 2. Holick MF,  Binkley [place], Bischoff-Ferrari HA, et al.    Evaluation, treatment, and prevention of vitamin D     deficiency: an Endocrine Society clinical practice    guideline. JCEM. 2011 Jul; 96(7):1911-30.   Vitamin B12     Status: Abnormal   Collection Time: 01/02/24  3:13 PM  Result Value Ref Range   Vitamin B-12 >2000 (H) 232 - 1245 pg/mL  NuSwab VG+, HSV     Status: None   Collection Time: 01/02/24  3:37 PM  Result Value Ref Range   Atopobium vaginae Low - 0 Score   BVAB 2 Low - 0 Score   Megasphaera 1 Low - 0 Score    Comment: Calculate total score by adding the 3 individual bacterial vaginosis (BV) marker scores together.  Total score is interpreted as follows: Total score 0-1: Indicates the absence of BV. Total score   2: Indeterminate for BV. Additional clinical                  data should be evaluated to establish a                  diagnosis. Total score 3-6: Indicates the presence of BV.    Candida albicans, NAA Negative Negative   Candida glabrata, NAA Negative Negative   Trich vag by NAA CANCELED     Comment: Test Not Performed.  The specimen submitted was too viscous for analysis.  Result canceled by the ancillary.    Chlamydia trachomatis, NAA CANCELED     Comment: Test Not Performed.  The specimen submitted was too viscous for analysis.  Result canceled by the ancillary.    Neisseria gonorrhoeae, NAA CANCELED     Comment: Test Not Performed.  The specimen submitted was too viscous for analysis.  Result canceled by the ancillary.    HSV 1 NAA Negative Negative   HSV 2 NAA Negative Negative  POCT urinalysis dipstick     Status: Normal   Collection Time: 01/28/24  1:15 PM  Result Value Ref Range   Color, UA yellow    Clarity, UA clear    Glucose, UA Negative Negative   Bilirubin, UA negative    Ketones, UA negative    Spec Grav, UA 1.010 1.010 - 1.025   Blood, UA negative    pH, UA 5.0 5.0 - 8.0   Protein, UA Negative Negative   Urobilinogen, UA 0.2 0.2 or  1.0 E.U./dL   Nitrite, UA negative    Leukocytes, UA Negative Negative   Appearance     Odor    Urine Culture     Status: None   Collection Time: 01/28/24  1:36 PM   Specimen: Urine   UR  Result Value Ref Range   Urine Culture, Routine Final report    Organism ID, Bacteria Comment     Comment: Culture shows less than 10,000 colony forming units of bacteria per milliliter of urine. This colony count is not generally considered to be clinically significant.        Assessment & Plan:   Assessment & Plan Sedative, hypnotic or anxiolytic dependence with withdrawal, unspecified (HCC)  Mild dementia without behavioral disturbance, psychotic disturbance, mood disturbance, or anxiety, unspecified dementia type (HCC)  Chronic obstructive pulmonary disease, unspecified COPD type (HCC)  Moderate episode of recurrent major depressive disorder (HCC)  Abnormal findings on diagnostic imaging of abdomen  Chronic constipation     No follow-ups on file.   Total time spent: {AMA time spent:29001} minutes  ALAN CHRISTELLA ARRANT, FNP  02/27/2024   This document may have been prepared by [facility] Voice Recognition software and as such may include unintentional dictation errors.     [1]  Allergies Allergen Reactions   Baclofen Other (See Comments)   "

## 2024-03-02 ENCOUNTER — Ambulatory Visit
Attending: Student in an Organized Health Care Education/Training Program | Admitting: Student in an Organized Health Care Education/Training Program

## 2024-03-02 ENCOUNTER — Encounter: Payer: Self-pay | Admitting: Student in an Organized Health Care Education/Training Program

## 2024-03-02 VITALS — BP 125/69 | HR 95 | Temp 97.3°F | Resp 16 | Ht 62.0 in | Wt 112.0 lb

## 2024-03-02 DIAGNOSIS — G894 Chronic pain syndrome: Secondary | ICD-10-CM | POA: Diagnosis not present

## 2024-03-02 DIAGNOSIS — R102 Pelvic and perineal pain unspecified side: Secondary | ICD-10-CM | POA: Insufficient documentation

## 2024-03-02 DIAGNOSIS — G588 Other specified mononeuropathies: Secondary | ICD-10-CM | POA: Diagnosis not present

## 2024-03-02 MED ORDER — GABAPENTIN 100 MG PO CAPS: 100.0000 mg | ORAL_CAPSULE | Freq: Two times a day (BID) | ORAL | 2 refills | Status: AC

## 2024-03-02 NOTE — Progress Notes (Signed)
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 follow closely after the pudendal block to reassess pain, function, and next steps.  Plan  Start gabapentin  trial -- slow titration, monitor for sedation/dizziness  Proceed with pudendal nerve block (diagnostic + therapeutic)  If partial or temporary relief:  consider repeat blocks  consider ganglion impar block  consider superior hypogastric plexus block  For refractory pain: consider neuromodulation / spinal cord stimulation  Avoid opioids and other high-risk medications given safety concerns and UDS history  Emphasize bowel optimization, hydration, gentle activity, and close follow-up  Procedure Orders         Misc procedure     Pharmacotherapy  (current): Medications ordered:  Meds ordered this encounter  Medications   gabapentin  (NEURONTIN ) 100 MG capsule    Sig: Take 1 capsule (100 mg total) by mouth 2 (two) times daily.    Dispense:  60 capsule    Refill:  2   Medications administered during this visit: Sandra ORN. Mackowski had no medications administered during this visit.     Provider-requested follow-up: Return in about 3 weeks (around 03/23/2024) for B/L Pudendal nerve block with local.  No future appointments. I discussed the assessment and treatment plan with the patient. The patient was provided an opportunity to ask questions and all were answered. The patient agreed with the plan and demonstrated an understanding of the instructions.  Patient advised to call back or seek an in-person evaluation if the symptoms or condition worsens.  I personally spent a total of 60 minutes in the care of the patient today including preparing to see the patient, getting/reviewing separately obtained history, performing a medically appropriate exam/evaluation, counseling and educating, placing orders, and documenting clinical information in the EHR.   Note by: Wallie Sherry, MD (TTS and AI technology used. I apologize for any typographical errors that were not detected and corrected.) Date: 03/02/2024; Time: 1:48 PM     [1]  Current Outpatient Medications:    ALPRAZolam  (XANAX ) 1 MG tablet, Take 1 tablet (1 mg total) by mouth 2 (two) times daily as needed  for anxiety., Disp: 30 tablet, Rfl: 2   gabapentin  (NEURONTIN ) 100 MG capsule, Take 1 capsule (100 mg total) by mouth 2 (two) times daily., Disp: 60 capsule, Rfl: 2   methylphenidate  (RITALIN ) 20 MG tablet, Take 3-4 tablets by mouth. Per day in divided doses, Disp: , Rfl:

## 2024-03-02 NOTE — Patient Instructions (Signed)

## 2024-03-22 ENCOUNTER — Ambulatory Visit: Admitting: Student in an Organized Health Care Education/Training Program

## 2024-04-12 ENCOUNTER — Ambulatory Visit: Admitting: Student in an Organized Health Care Education/Training Program

## 2024-07-27 ENCOUNTER — Ambulatory Visit: Admitting: Family
